# Patient Record
Sex: Male | Born: 1965
Health system: Southern US, Community
[De-identification: ages and names within clinical notes are randomized; demographics above are authoritative.]

## PROBLEM LIST (undated history)

## (undated) DIAGNOSIS — F429 Obsessive-compulsive disorder, unspecified: Secondary | ICD-10-CM

## (undated) DIAGNOSIS — G8929 Other chronic pain: Secondary | ICD-10-CM

## (undated) DIAGNOSIS — M5126 Other intervertebral disc displacement, lumbar region: Secondary | ICD-10-CM

## (undated) DIAGNOSIS — M5430 Sciatica, unspecified side: Secondary | ICD-10-CM

## (undated) DIAGNOSIS — F419 Anxiety disorder, unspecified: Secondary | ICD-10-CM

## (undated) DIAGNOSIS — I10 Essential (primary) hypertension: Secondary | ICD-10-CM

## (undated) DIAGNOSIS — K219 Gastro-esophageal reflux disease without esophagitis: Secondary | ICD-10-CM

## (undated) DIAGNOSIS — M549 Dorsalgia, unspecified: Secondary | ICD-10-CM

## (undated) HISTORY — PX: TMJ ARTHROPLASTY: SHX1066

## (undated) HISTORY — PX: APPENDECTOMY: SHX54

## (undated) HISTORY — PX: LUMBAR EPIDURAL INJECTION: SHX1980

## (undated) HISTORY — DX: Gastro-esophageal reflux disease without esophagitis: K21.9

## (undated) HISTORY — PX: TRIGGER POINT INJECTION: SHX2580

---

## 1999-08-30 ENCOUNTER — Ambulatory Visit (HOSPITAL_COMMUNITY): Admission: RE | Admit: 1999-08-30 | Discharge: 1999-08-30 | Payer: Self-pay | Admitting: Gastroenterology

## 2012-02-06 ENCOUNTER — Encounter (HOSPITAL_COMMUNITY): Payer: Self-pay | Admitting: Emergency Medicine

## 2012-02-06 ENCOUNTER — Emergency Department (HOSPITAL_COMMUNITY)
Admission: EM | Admit: 2012-02-06 | Discharge: 2012-02-07 | Disposition: A | Discharge status: Home / Self Care | Payer: Self-pay | Attending: Emergency Medicine | Admitting: Emergency Medicine

## 2012-02-06 DIAGNOSIS — M545 Low back pain, unspecified: Secondary | ICD-10-CM | POA: Insufficient documentation

## 2012-02-06 DIAGNOSIS — F17200 Nicotine dependence, unspecified, uncomplicated: Secondary | ICD-10-CM | POA: Insufficient documentation

## 2012-02-06 DIAGNOSIS — G8929 Other chronic pain: Secondary | ICD-10-CM | POA: Insufficient documentation

## 2012-02-06 DIAGNOSIS — I10 Essential (primary) hypertension: Secondary | ICD-10-CM | POA: Insufficient documentation

## 2012-02-06 HISTORY — DX: Other chronic pain: G89.29

## 2012-02-06 HISTORY — DX: Essential (primary) hypertension: I10

## 2012-02-06 HISTORY — DX: Dorsalgia, unspecified: M54.9

## 2012-02-06 MED ORDER — OXYCODONE-ACETAMINOPHEN 5-325 MG PO TABS: ORAL_TABLET | ORAL | Status: DC

## 2012-02-06 MED ORDER — ACETAMINOPHEN 325 MG PO TABS
650.0000 mg | ORAL_TABLET | Freq: Once | ORAL | Status: DC
  Filled 2012-02-06 (×2): qty 1

## 2012-02-06 NOTE — ED Notes (Signed)
Patient says he did not do anything to hurt his back it just began hurting at lunch and it progressively got worse.  Patient says his pain got severe and he decided to come to the ED.

## 2012-02-06 NOTE — ED Notes (Addendum)
PT. REPORTS CHRONIC LOW BACK PAIN WORSE TODAY UNRELIEVED BY PRESCRIPTION PERCOCET , DENIES INJURY OR FALL ,AMBULATORY. STATES HE TOOK HIS LAST PERCOCET THIS AFTERNOON .

## 2012-02-06 NOTE — ED Provider Notes (Signed)
History     CSN: [id number]  Arrival date & time 02/06/12  2300   First MD Initiated Contact with Patient 02/06/12 2315      Chief Complaint  Patient presents with  . Back Pain    (Consider location/radiation/quality/duration/timing/severity/associated sxs/prior treatment) HPI  Roberto Blair is a 46 y.o. male complaining of exacerbation of "chronic pain syndrome" Pt recently moved to town and has not been able to set up pain management. Pt states the pain is largely in his low back, rated at 10/10. Denies  fever, change in bowel or bladder habits, h/o IVDU or Cancer. Pt states he normally take MScontin and percocet but has run out.    Past Medical History  Diagnosis Date  . Chronic back pain   . Hypertension     Past Surgical History  Procedure Date  . Appendectomy   . Tmj arthroplasty     No family history on file.  History  Substance Use Topics  . Smoking status: Current Every Day Smoker  . Smokeless tobacco: Not on file  . Alcohol Use: No      Review of Systems  Constitutional: Negative for fever.  Respiratory: Negative for shortness of breath.   Cardiovascular: Negative for chest pain.  Gastrointestinal: Negative for nausea, vomiting, abdominal pain and diarrhea.  Musculoskeletal: Positive for back pain.  All other systems reviewed and are negative.    Allergies  Nsaids  Home Medications  No current outpatient prescriptions on file.  BP 111/79  Pulse 88  Temp 98 F (36.7 C) (Oral)  Resp 14  SpO2 98%  Physical Exam  Nursing note and vitals reviewed. Constitutional: He is oriented to person, place, and time. He appears well-developed and well-nourished. No distress.  HENT:  Head: Normocephalic.  Eyes: Conjunctivae normal and EOM are normal.  Cardiovascular: Normal rate.   Pulmonary/Chest: Effort normal. No stridor.  Musculoskeletal: Normal range of motion.       Back:       Bilateral lumbar paraspinal musculature TTP. Reduced ROM.     Neurological: He is alert and oriented to person, place, and time.       Strength 5/5 x4. Ambulates with antalgic gait. Distal sensation intact  Psychiatric: He has a normal mood and affect.    ED Course  Procedures (including critical care time)  Labs Reviewed - No data to display No results found.   1. Chronic pain       MDM  Pain Rx options limited by the fact that the pt is driving himself home.    Pt verbalized understanding and agrees with care plan. Outpatient follow-up and return precautions given.    New Prescriptions   OXYCODONE-ACETAMINOPHEN (PERCOCET/ROXICET) 5-325 MG PER TABLET    1 to 2 tabs PO q6hrs  PRN for pain          Wynetta Emery, PA-C 02/06/12 2351  Wynetta Emery, PA-C 03/27/12 [phone]

## 2012-02-07 NOTE — ED Notes (Signed)
Patient is alert and orientedx4.  Patient was explained discharge instructions and he understood them. 

## 2012-02-23 ENCOUNTER — Emergency Department (INDEPENDENT_AMBULATORY_CARE_PROVIDER_SITE_OTHER)
Admission: EM | Admit: 2012-02-23 | Discharge: 2012-02-23 | Disposition: A | Discharge status: Home / Self Care | Payer: Self-pay | Source: Home / Self Care | Attending: Emergency Medicine | Admitting: Emergency Medicine

## 2012-02-23 ENCOUNTER — Encounter (HOSPITAL_COMMUNITY): Payer: Self-pay | Admitting: Emergency Medicine

## 2012-02-23 DIAGNOSIS — F411 Generalized anxiety disorder: Secondary | ICD-10-CM

## 2012-02-23 DIAGNOSIS — Z760 Encounter for issue of repeat prescription: Secondary | ICD-10-CM

## 2012-02-23 HISTORY — DX: Sciatica, unspecified side: M54.30

## 2012-02-23 HISTORY — DX: Anxiety disorder, unspecified: F41.9

## 2012-02-23 HISTORY — DX: Other intervertebral disc displacement, lumbar region: M51.26

## 2012-02-23 HISTORY — DX: Obsessive-compulsive disorder, unspecified: F42.9

## 2012-02-23 NOTE — ED Provider Notes (Signed)
History     CSN: [id number]  Arrival date & time 02/23/12  1029   First MD Initiated Contact with Patient 02/23/12 1056      Chief Complaint  Patient presents with  . Medication Refill    pt is from [place] has been here the past month taking care of wifes mother and needs refill on meds    (Consider location/radiation/quality/duration/timing/severity/associated sxs/prior treatment) HPI Comments: Patient presents urgent care today requesting refills of his medicines including clonazepam and oxycodone. As far as MS Contin. Patient describes ongoing lower back pain for years and he is attended by a primary care Dr. that has been providing him with pain medicines. He recently [place] to [place] but have not discontinued his relationship with his primary care Dr. to [facility]. Patient denies any fevers, unintentional weight loss, loss of muscle control of his lower extremities. Have gone to the emergency department within the course of the month and obtained some narcotics. At that time.  The history is provided by the patient.    Past Medical History  Diagnosis Date  . Chronic back pain   . Hypertension   . Anxiety   . Sciatica   . Lumbar disc herniation   . OCD (obsessive compulsive disorder)     Past Surgical History  Procedure Date  . Appendectomy   . Tmj arthroplasty     History reviewed. No pertinent family history.  History  Substance Use Topics  . Smoking status: Current Every Day Smoker  . Smokeless tobacco: Not on file  . Alcohol Use: No      Review of Systems  Constitutional: Negative for fever, activity change, appetite change and unexpected weight change.  Musculoskeletal: Positive for back pain. Negative for gait problem.  Psychiatric/Behavioral: Negative for suicidal ideas, self-injury and agitation.    Allergies  Nsaids  Home Medications   Current Outpatient Rx  Name  Route  Sig  Dispense  Refill  . CARISOPRODOL 350 MG PO  TABS   Oral   Take 350 mg by mouth 4 (four) times daily as needed.         Marland Kitchen CLONAZEPAM 1 MG PO TABS   Oral   Take 1 mg by mouth 2 (two) times daily as needed.         Marland Kitchen GABAPENTIN 300 MG PO CAPS   Oral   Take 300 mg by mouth 3 (three) times daily.         . OXYCODONE-ACETAMINOPHEN 5-325 MG PO TABS      1 to 2 tabs PO q6hrs  PRN for pain   10 tablet   0   . QUETIAPINE FUMARATE 100 MG PO TABS   Oral   Take 100 mg by mouth at bedtime.         Marland Kitchen LOTREL PO   Oral   Take by mouth.           BP 147/90  Pulse 82  Temp 97 F (36.1 C) (Oral)  Resp 18  SpO2 98%  Physical Exam  Nursing note and vitals reviewed. Constitutional: He is oriented to person, place, and time. He appears well-developed and well-nourished. No distress.  Musculoskeletal:       Arms: Neurological: He is alert and oriented to person, place, and time.    ED Course  Procedures (including critical care time)  Labs Reviewed - No data to display No results found.   1. Medication refill  MDM  Visit for medication refills patient requesting refills on his narcotic medicines. Patient admits to be an active patient of the [facility] practice in [place]. He's under a pain management contract with a local provider there. Was seen in the emergency department during December provided with narcotics. On this instance we have discussed with patient the need for him to communicate with his doctor to obtain medicine refills. He agrees and will proceed to do so. He described that he was having transportation issues to get to see his doctor.        Jimmie Molly, MD 02/23/12 [phone]

## 2012-02-23 NOTE — ED Notes (Signed)
Pt has been out of meds for the past 10 days. Mw,cma

## 2012-02-23 NOTE — ED Notes (Signed)
Pt in need of medication refill. Pt is from [place] has been here the past month taking care of wife's mother.

## 2012-03-27 NOTE — ED Provider Notes (Signed)
Medical screening examination/treatment/procedure(s) were performed by non-physician practitioner and as supervising physician I was immediately available for consultation/collaboration.  Toy Baker, MD 03/27/12 [phone]

## 2019-03-09 ENCOUNTER — Ambulatory Visit (HOSPITAL_COMMUNITY)
Admission: EM | Admit: 2019-03-09 | Discharge: 2019-03-09 | Disposition: A | Discharge status: Home / Self Care | Payer: 59 | Attending: Emergency Medicine | Admitting: Emergency Medicine

## 2019-03-09 ENCOUNTER — Ambulatory Visit (INDEPENDENT_AMBULATORY_CARE_PROVIDER_SITE_OTHER): Payer: 59

## 2019-03-09 ENCOUNTER — Encounter (HOSPITAL_COMMUNITY): Payer: Self-pay

## 2019-03-09 ENCOUNTER — Other Ambulatory Visit: Payer: Self-pay

## 2019-03-09 DIAGNOSIS — W000XXA Fall on same level due to ice and snow, initial encounter: Secondary | ICD-10-CM

## 2019-03-09 DIAGNOSIS — S5002XA Contusion of left elbow, initial encounter: Secondary | ICD-10-CM | POA: Diagnosis not present

## 2019-03-09 DIAGNOSIS — Z760 Encounter for issue of repeat prescription: Secondary | ICD-10-CM | POA: Diagnosis not present

## 2019-03-09 DIAGNOSIS — M25522 Pain in left elbow: Secondary | ICD-10-CM

## 2019-03-09 DIAGNOSIS — G8929 Other chronic pain: Secondary | ICD-10-CM | POA: Diagnosis not present

## 2019-03-09 DIAGNOSIS — G8921 Chronic pain due to trauma: Secondary | ICD-10-CM

## 2019-03-09 MED ORDER — METHYLPREDNISOLONE 4 MG PO TBPK: ORAL_TABLET | Freq: Every day | ORAL | 0 refills | Status: DC

## 2019-03-09 MED ORDER — CARISOPRODOL 350 MG PO TABS: 350.0000 mg | ORAL_TABLET | Freq: Three times a day (TID) | ORAL | 0 refills | Status: AC

## 2019-03-09 MED ORDER — MORPHINE SULFATE ER 30 MG PO TBCR: 30.0000 mg | EXTENDED_RELEASE_TABLET | Freq: Three times a day (TID) | ORAL | 0 refills | Status: DC

## 2019-03-09 NOTE — ED Triage Notes (Signed)
Pt presents with left elbow injury after falling out on ice 2 days ago;  Pt has limited grip and limited ROM

## 2019-03-09 NOTE — ED Provider Notes (Signed)
HPI  SUBJECTIVE:  Roberto Blair is a right-handed 54 y.o. male who presents with left lateral elbow pain after having a slip and fall 2 days ago on some ice.  States he landed on his lateral elbow.  Reports swelling and a tender "knot".  He states that the pain radiates above and below his elbow.  He is describes it as sharp, constant.  No erythema, bruising.  He reports grip weakness secondary to pain, numbness and tingling in his hand and limitation of motion of his elbow secondary to pain.  Ice injury to his shoulder and wrist.  He has tried ice and a TENS unit with improvement in his symptoms.  Symptoms are worse with flexion extension at the extremes, supination.  He is a smoker.  Has history of chronic back pain, hypertension, is not on any medications for this.  No illicit drugs, alcohol use, osteoporosis, diabetes, GI bleed.  He has a history of peptic ulcer disease.  No history of cirrhosis.  He is also requesting a refill of his chronic pain medications.  States that he recently moved here, and his doctor in [place] is unable to prescribe any further controlled substances for him because he lives more than 4 hours away.  States that he has tried calling all pain management practices in [place] and has been unable to get into any one of them.  States that there is a 22-month waiting list for all of them.  States that he has been taking Soma, morphine sulfate ER for "years" last filled on 11/23, written by Riley Churches.  PMD: [facility].   Past Medical History:  Diagnosis Date  . Anxiety   . Chronic back pain   . Hypertension   . Lumbar disc herniation   . OCD (obsessive compulsive disorder)   . Sciatica     Past Surgical History:  Procedure Laterality Date  . APPENDECTOMY    . TMJ ARTHROPLASTY      Family History  Family history unknown: Yes    Social History   Tobacco Use  . Smoking status: Current Every Day Smoker  Substance Use Topics  .  Alcohol use: No  . Drug use: No    No current facility-administered medications for this encounter.  Current Outpatient Medications:  .  Amlodipine Besy-Benazepril HCl (LOTREL PO), Take by mouth., Disp: , Rfl:  .  carisoprodol (SOMA) 350 MG tablet, Take 1 tablet (350 mg total) by mouth 3 (three) times daily for 2 days., Disp: 6 tablet, Rfl: 0 .  gabapentin (NEURONTIN) 300 MG capsule, Take 300 mg by mouth 3 (three) times daily., Disp: , Rfl:  .  methylPREDNISolone (MEDROL DOSEPAK) 4 MG TBPK tablet, Take by mouth daily. Follow package instructions, Disp: 21 tablet, Rfl: 0 .  morphine (MS CONTIN) 30 MG 12 hr tablet, Take 1 tablet (30 mg total) by mouth 3 (three) times daily for 2 days., Disp: 6 tablet, Rfl: 0 .  QUEtiapine (SEROQUEL) 100 MG tablet, Take 100 mg by mouth at bedtime., Disp: , Rfl:   Allergies  Allergen Reactions  . Nsaids Other (See Comments)    Ulcer bleed     ROS  As noted in HPI.   Physical Exam  BP (!) 168/85 (BP Location: Right Arm)   Pulse 70   Temp 98.4 F (36.9 C) (Oral)   Resp 17   SpO2 96%   Constitutional: Well developed, well nourished, no acute distress Eyes:  EOMI, conjunctiva normal bilaterally  HENT: Normocephalic, atraumatic,mucus membranes moist Respiratory: Normal inspiratory effort Cardiovascular: Normal rate GI: nondistended skin: No rash, skin intact Musculoskeletal: Tender palpable mass lateral epicondyle.  Left Elbow ROM intact, able to fully extend and flex to 90 degrees, pain aggravated with extremes of flexion extension, Supracondylar region NT , Radial head NT, Olecrenon process NT , Medial epicondyle NT  Lateral epicondyle  tender,  Wrist NT, Hand NT with distal NVI CR<2secs, radial pulse intact, Sensation LT and Motor intact distally in distribution of radial, median, and ulnar nerve function.  Left grip 4/5 compared with right hand.     Neurologic: Alert & oriented x 3, no focal neuro deficits Psychiatric: Speech and behavior  appropriate   ED Course   Medications - No data to display  Orders Placed This Encounter  Procedures  . DG ELBOW COMPLETE LEFT (3+VIEW)    Standing Status:   Standing    Number of Occurrences:   1    Order Specific Question:   Reason for Exam (SYMPTOM  OR DIAGNOSIS REQUIRED)    Answer:   Fall    Order Specific Question:   Release to patient    Answer:   Immediate    No results found for this or any previous visit (from the past 24 hour(s)). DG ELBOW COMPLETE LEFT (3+VIEW)  Result Date: 03/09/2019 CLINICAL DATA:  Pain after fall. EXAM: LEFT ELBOW - COMPLETE 3+ VIEW COMPARISON:  None. FINDINGS: There is no evidence of fracture, dislocation, or joint effusion. There is no evidence of arthropathy or other focal bone abnormality. Soft tissues are unremarkable. IMPRESSION: Negative. Electronically Signed   By: Gerome Sam III M.D   On: 03/09/2019 17:11    ED Clinical Impression  1. Contusion of left elbow, initial encounter   2. Chronic pain due to trauma   3. Medication refill      ED Assessment/Plan  Imaging independently reviewed.  Normal elbow.  See radiology report for details.  Patient with an elbow contusion.  Continue ice, TENS unit, start 1000 mg of Tylenol 3-4 times a day.  Will send home with a Medrol Dosepak.  Patient states that he is unable to take NSAIDs due to peptic ulcer disease..  [place] [facility] database reviewed for this patient, patient has gotten the same prescriptions from the same several providers for the past 2 years.  Last prescription was dispensed 01/20/2019.  Discussed with patient that I was unable to provide refill of his long-term medications, but I can give him several days worth.  Will provide him primary care list for close follow-up and further management of his pain medications.  Advised him that he would not be able to get further refills at this facility.  Discussed imaging, MDM, treatment plan, and plan for follow-up with patient.  patient  agrees with plan.   Meds ordered this encounter  Medications  . carisoprodol (SOMA) 350 MG tablet    Sig: Take 1 tablet (350 mg total) by mouth 3 (three) times daily for 2 days.    Dispense:  6 tablet    Refill:  0  . morphine (MS CONTIN) 30 MG 12 hr tablet    Sig: Take 1 tablet (30 mg total) by mouth 3 (three) times daily for 2 days.    Dispense:  6 tablet    Refill:  0  . methylPREDNISolone (MEDROL DOSEPAK) 4 MG TBPK tablet    Sig: Take by mouth daily. Follow package instructions    Dispense:  21 tablet  Refill:  0    *This clinic note was created using [employer]. Therefore, there may be occasional mistakes despite careful proofreading.   ?    Melynda Ripple, MD 03/09/19 [phone]

## 2019-03-09 NOTE — Discharge Instructions (Signed)
Continue ice, TENS unit.  1 g of Tylenol 3 4 times a day as needed.  The Medrol Dosepak may also help.  Unfortunately, I am not able to give you any more than a few days worth of your chronic medications.  You will not be able to have these refilled at this facility again.  Please call the [facility] internal medicine clinic to be seen as soon as you can.  Below is a list of primary care practices who are taking new patients for you to follow-up with.  [facility] internal medicine clinic Ground Floor - [facility], [address] [place], [place], [place] [address] [phone]  [facility] at [facility] [address] [place], [place] [address] [phone]  [facility] [address] [place], [place] [address] [phone]  [facility] [phone] [address] [place] [address]  [facility]: [address] [place] [address]  [phone]  [facility]: [address] [place] [address]   [phone]  [facility]: [address] [place] [address]  [phone]  [facility] : [address] [place] [address] [phone]  [facility]: [address] [place] [address] [phone]  [facility]: [address] [place] [address] [phone]  Dr. Oneal Grout [address] [facility] [place] [address]  [phone]  Dr. Jackie Plum, [facility]. [address] [place], [place] [address]  [phone]  Go to www.goodrx.com to look up your medications. This will give you a list of where you can find your prescriptions at the most affordable prices. Or ask the pharmacist what the cash price is,  or if they have any other discount programs available to help make your medication more affordable. This can be less expensive than what you would pay with insurance.

## 2019-03-10 ENCOUNTER — Telehealth (HOSPITAL_COMMUNITY): Payer: Self-pay | Admitting: Emergency Medicine

## 2019-03-10 NOTE — Telephone Encounter (Signed)
Pts wife calling with questions about his f/u care.  She was not on [employer] so I called the pt personally.    Pt is concerned because he was only written 2 days of pain medication and he can't get a new patient appointment for at least one month.  Pt is concerned about where he will be able to get more pain medication.  I explained to the pt that we are only allowed to write pain prescriptions for a certain amount of time per federal law.  Pt stated understanding but didn't know what to do.  It was suggested he make a new pt appointment wherever he most liked and to call us for a telehealth visit and discuss his issues with a provider.  I explained they would better be able to tell him exactly how to handle this situation.    Pt stated understanding and stated he would make a new pt appointment as soon as possible.

## 2019-03-11 ENCOUNTER — Telehealth: Payer: Self-pay | Admitting: Medical

## 2019-03-11 ENCOUNTER — Other Ambulatory Visit: Payer: Self-pay

## 2019-03-11 ENCOUNTER — Encounter: Payer: Self-pay | Admitting: Medical

## 2019-03-11 ENCOUNTER — Ambulatory Visit: Payer: 59 | Admitting: Medical

## 2019-03-11 VITALS — BP 146/74 | HR 92 | Temp 98.4°F | Ht 72.0 in | Wt 216.6 lb

## 2019-03-11 DIAGNOSIS — R41840 Attention and concentration deficit: Secondary | ICD-10-CM | POA: Insufficient documentation

## 2019-03-11 DIAGNOSIS — M25562 Pain in left knee: Secondary | ICD-10-CM

## 2019-03-11 DIAGNOSIS — T39391A Poisoning by other nonsteroidal anti-inflammatory drugs [NSAID], accidental (unintentional), initial encounter: Secondary | ICD-10-CM | POA: Insufficient documentation

## 2019-03-11 DIAGNOSIS — M5440 Lumbago with sciatica, unspecified side: Secondary | ICD-10-CM | POA: Diagnosis not present

## 2019-03-11 DIAGNOSIS — S5002XA Contusion of left elbow, initial encounter: Secondary | ICD-10-CM | POA: Diagnosis not present

## 2019-03-11 DIAGNOSIS — G8929 Other chronic pain: Secondary | ICD-10-CM | POA: Insufficient documentation

## 2019-03-11 DIAGNOSIS — Z720 Tobacco use: Secondary | ICD-10-CM

## 2019-03-11 DIAGNOSIS — M25561 Pain in right knee: Secondary | ICD-10-CM | POA: Diagnosis not present

## 2019-03-11 DIAGNOSIS — F5104 Psychophysiologic insomnia: Secondary | ICD-10-CM | POA: Insufficient documentation

## 2019-03-11 MED ORDER — MORPHINE SULFATE ER 30 MG PO TBCR: 30.0000 mg | EXTENDED_RELEASE_TABLET | Freq: Three times a day (TID) | ORAL | 0 refills | Status: DC

## 2019-03-11 NOTE — Telephone Encounter (Signed)
Pts wife called and wanted to see if you were sending all pts meds to the pharmacy because he went to pick them up but only 1 was sent over
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
## 2019-03-11 NOTE — Telephone Encounter (Signed)
Refer to pain management ASAP

## 2019-03-12 ENCOUNTER — Other Ambulatory Visit: Payer: Self-pay | Admitting: Medical

## 2019-03-12 NOTE — Telephone Encounter (Signed)
(  Beverlee Nims - please help.  He was new to [place] yesterday.  He just moved up from [place] 3 months ago.  Apparently his wife worked for Dr. [employer] several years ago.  His wife has already called twice today requesting his other medicines be refilled which are mostly controlled substances)   To patient or wife:  I reviewed the request for medication review.  I just met him yesterday.  I am reviewing his chart.  I have discussed this chart and case with my supervising physician Dr. Redmond School.  Our clinic ,like many clinics, review patient records before deciding to take over care on a new patient.  So the first visit is like an interview for the patient and the doctor's office.    We generally DO NOT treat chronic pain, and he is on several controlled substances, several sedating medications including high potency pain medicine morphine.    I am not going to refill ANY medications until I get records from his prior primary care office.  This could take 2 days or 3 weeks.  I have no control over how long this process takes.  They can certainly call [facility] and asked them to expedite record release.  He may want to talk to [facility] about additional refills since they were managing his medications prior.  It is unfortunate that he is out of medication.  That is out of my control.    We will continue plan to refer to a local pain clinic.    Generally when someone establishes care home numerous medications including strong sleep aids, we try to make an attempt over time to find ways to reduce some of the pill burden and find better ways to manage insomnia.  He is on attention deficit medication.  I will need to make sure that the records we receive has evidence of prior evaluation for official diagnosis of ADD.   I want to also look for prior evaluation for insomnia and sleep issues.  Bottom line, if he needs refills within the next 3-4 weeks he needs to call his prior PCP and request them   to help until he gets established with a pain management clinic.  If he is looking for one clinic to manage all these issues and he may want to find another clinic that may have more of an expertise with this.  Again, I am not guaranteeing we will manage his sleep issues, attention deficit issues etc. and medications until after reviewing his records.  Audelia Acton

## 2019-03-12 NOTE — Telephone Encounter (Signed)
Please review message. And wife wants him to be referred to [facility].

## 2019-03-12 NOTE — Telephone Encounter (Signed)
Patient wife called stating he needs all medications that was sent in.

## 2019-03-13 NOTE — Telephone Encounter (Signed)
I spoke with pt, explained we have not received the medical records and nothing more can be done until received and reviewed.  And then no promises.  I gave him all of your information.  He said he understands.  I advised he will need to get future refills from his old pcp.  He states he hasn't seen them since November and since he has moved they will not cover him.  I inquired why took so long to make an appt with PCP and he said everyone was booked out and then his wife remember [place].  Again I reminded him we do not cover for controlled substances and you are not sure you will be able to cover him for all the other meds.  He states please refer him to [facility].  Also he states as of tomorrow he will not have any meds and could you give him 3 pills for the weekend.  And he would be willing to do a virtual visit with you every 3 days if needed.  I explained to him the virtual visits will not tell us about his history.  Please advise pt about the 3 pills.

## 2019-03-14 ENCOUNTER — Other Ambulatory Visit: Payer: Self-pay | Admitting: Medical

## 2019-03-14 ENCOUNTER — Other Ambulatory Visit: Payer: Self-pay

## 2019-03-14 DIAGNOSIS — G8929 Other chronic pain: Secondary | ICD-10-CM

## 2019-03-14 DIAGNOSIS — M25562 Pain in left knee: Secondary | ICD-10-CM

## 2019-03-14 DIAGNOSIS — M5440 Lumbago with sciatica, unspecified side: Secondary | ICD-10-CM

## 2019-03-14 DIAGNOSIS — S5002XA Contusion of left elbow, initial encounter: Secondary | ICD-10-CM

## 2019-03-14 MED ORDER — MORPHINE SULFATE ER 30 MG PO TBCR: 30.0000 mg | EXTENDED_RELEASE_TABLET | Freq: Three times a day (TID) | ORAL | 0 refills | Status: AC

## 2019-03-18 ENCOUNTER — Telehealth: Payer: Self-pay | Admitting: Medical

## 2019-03-18 NOTE — Telephone Encounter (Signed)
Pt wife called and state that pt needs refill on on pts medicines states that she talked to [facility] and they are waiting to see if the dr will see him and said they will take about a week and they if do they are booked out till the end of the month, and if the medicine is called in if the medical asstiant will call her back and she states no one calls her back

## 2019-03-19 ENCOUNTER — Telehealth: Payer: Self-pay | Admitting: Family Medicine

## 2019-03-19 ENCOUNTER — Encounter: Payer: Self-pay | Admitting: Family Medicine

## 2019-03-19 NOTE — Telephone Encounter (Signed)
Wife has been calling 2 times a day advising how many medications we need to go ahead and refill for her husband.   A conversation has been had on several occassions with patient, that until we receive the medical records we can do nothing further.  Also, no guarantee that once we receive records that we would be able to do anything.  We do not fill Controlled Substance.  I discussed with provider Kristian Covey and he agrees we will not be able to accept [facility] Roberto Blair's care.  I answered wife's call and explained to her, I understand the frustration as we still do not have the records.  We will not be able to care for her husband.  She understood.

## 2019-03-25 ENCOUNTER — Other Ambulatory Visit: Payer: Self-pay

## 2019-03-25 ENCOUNTER — Ambulatory Visit: Payer: 59 | Admitting: Internal Medicine

## 2019-03-25 ENCOUNTER — Encounter: Payer: Self-pay | Admitting: Internal Medicine

## 2019-03-25 VITALS — BP 143/74 | HR 73 | Temp 98.2°F | Ht 72.0 in | Wt 217.7 lb

## 2019-03-25 DIAGNOSIS — M5441 Lumbago with sciatica, right side: Secondary | ICD-10-CM | POA: Diagnosis not present

## 2019-03-25 DIAGNOSIS — W19XXXD Unspecified fall, subsequent encounter: Secondary | ICD-10-CM

## 2019-03-25 DIAGNOSIS — M5442 Lumbago with sciatica, left side: Secondary | ICD-10-CM | POA: Diagnosis not present

## 2019-03-25 DIAGNOSIS — M25562 Pain in left knee: Secondary | ICD-10-CM

## 2019-03-25 DIAGNOSIS — S5002XA Contusion of left elbow, initial encounter: Secondary | ICD-10-CM

## 2019-03-25 DIAGNOSIS — M25561 Pain in right knee: Secondary | ICD-10-CM | POA: Diagnosis not present

## 2019-03-25 DIAGNOSIS — G8921 Chronic pain due to trauma: Secondary | ICD-10-CM

## 2019-03-25 DIAGNOSIS — S5002XD Contusion of left elbow, subsequent encounter: Secondary | ICD-10-CM

## 2019-03-25 DIAGNOSIS — Z79899 Other long term (current) drug therapy: Secondary | ICD-10-CM

## 2019-03-25 DIAGNOSIS — F5104 Psychophysiologic insomnia: Secondary | ICD-10-CM

## 2019-03-25 DIAGNOSIS — G8929 Other chronic pain: Secondary | ICD-10-CM

## 2019-03-25 DIAGNOSIS — Z79891 Long term (current) use of opiate analgesic: Secondary | ICD-10-CM

## 2019-03-25 DIAGNOSIS — R41840 Attention and concentration deficit: Secondary | ICD-10-CM

## 2019-03-25 MED ORDER — QUETIAPINE FUMARATE 100 MG PO TABS: 100.0000 mg | ORAL_TABLET | Freq: Every day | ORAL | 0 refills | Status: DC

## 2019-03-25 MED ORDER — AMPHETAMINE-DEXTROAMPHETAMINE 20 MG PO TABS: 20.0000 mg | ORAL_TABLET | Freq: Two times a day (BID) | ORAL | 0 refills | Status: DC

## 2019-03-25 MED ORDER — MORPHINE SULFATE ER 30 MG PO T12A: 30.0000 mg | EXTENDED_RELEASE_TABLET | Freq: Three times a day (TID) | ORAL | 0 refills | Status: DC | PRN

## 2019-03-25 MED ORDER — CLONIDINE HCL 0.1 MG PO TABS: 0.1000 mg | ORAL_TABLET | Freq: Every day | ORAL | 0 refills | Status: DC

## 2019-03-25 NOTE — Assessment & Plan Note (Signed)
He fell a few weeks ago on his elbow. X-ray was done but this is still hurting significantly. The pain is on the medial and lateral aspects of the elbow and is worse when he flexes his arm and is trying to lift something. There is minor swelling around the elbow joint. Full active and passive ROM.   - will refer for physical therapy, may need consider MRI if no improvement

## 2019-03-25 NOTE — Assessment & Plan Note (Signed)
He takes amphetamine-dextroamphetamine for difficulty with concentration. He states he has been on this for many years as well. He is not sure if he was every officially tested for ADD but was not on these medications as a child. 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  - requesting records from PCP  - refill adderall 20 mg bid

## 2019-03-25 NOTE — Patient Instructions (Addendum)
Thank you for allowing [place] to provide your care today. Today we discussed your chronic lower extremity and back pain.   I have refilled your medications and placed a referral to the Pain Management clinic. They will call you to make an appointment.   I have also placed a referral for physical therapy, they should call you for an appointment.   Please follow-up in two weeks with PCP.     Please call the internal medicine center clinic if you have any questions or concerns, we may be able to help and keep you from a long and expensive emergency room wait. Our clinic and after hours phone number is [phone], the best time to call is Monday through Friday 9 am to 4 pm but there is always someone available 24/7 if you have an emergency. If you need medication refills please notify your pharmacy one week in advance and they will send [place] a request.
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
## 2019-03-25 NOTE — Assessment & Plan Note (Addendum)
He is on a very high level of centrally acting medications for his pain in addition to seroquel and clonidine for difficulty sleeping and adderall for concentration. Long conversation was had discussing my concerns with the many medications he is currently on. Discussed that while we can do some short term prescription while he is establishing with a pain management clinic, our clinic does not typically manage such high level chronic opiates, and I have concerns with the inherent risks of this many centrally acting medications. He endorsed understanding.   - two month controlled medication contract for morphine and adderall  - referral placed to pain management - UDS at follow-up in two weeks  - requesting records and imaging from PCP in [place] - hold closiprodol 350 mg bid - cont. Morphine 30 mg tid prn  - PDMP reviewed  - referral to physical therapy

## 2019-03-25 NOTE — Assessment & Plan Note (Signed)
He takes seroquel and clonidine for insomnia and difficulty sleeping and has also been on these medications for many years. He states he has been unable to sleep without them.   - refill seroquel and clonidine - discuss sleep hygiene further at follow-up - records pending from previous PCP

## 2019-03-26 NOTE — Progress Notes (Signed)
Internal Medicine Clinic Attending  Case discussed with Dr. Seawell at the time of the visit.  We reviewed the resident's history and exam and pertinent patient test results.  I agree with the assessment, diagnosis, and plan of care documented in the resident's note.    

## 2019-03-27 ENCOUNTER — Other Ambulatory Visit: Payer: Self-pay | Admitting: Internal Medicine

## 2019-03-27 DIAGNOSIS — G8929 Other chronic pain: Secondary | ICD-10-CM

## 2019-03-27 DIAGNOSIS — M5440 Lumbago with sciatica, unspecified side: Secondary | ICD-10-CM

## 2019-03-27 NOTE — Progress Notes (Signed)
pian

## 2019-03-28 ENCOUNTER — Telehealth: Payer: Self-pay | Admitting: Medical

## 2019-03-28 NOTE — Telephone Encounter (Signed)
Of note, I received notice today that [facility] will not take him on as a patient.

## 2019-03-28 NOTE — Telephone Encounter (Signed)
Thanks, yea we got notice. I sent out a new referral. Tough case, hopefully he is able to get in somewhere.

## 2019-04-02 ENCOUNTER — Encounter: Payer: Self-pay | Admitting: *Deleted

## 2019-04-08 ENCOUNTER — Other Ambulatory Visit: Payer: Self-pay

## 2019-04-08 ENCOUNTER — Ambulatory Visit: Payer: 59 | Admitting: Internal Medicine

## 2019-04-08 ENCOUNTER — Encounter: Payer: Self-pay | Admitting: Internal Medicine

## 2019-04-08 VITALS — BP 114/65 | HR 82 | Ht 73.0 in | Wt 220.6 lb

## 2019-04-08 DIAGNOSIS — G8929 Other chronic pain: Secondary | ICD-10-CM | POA: Diagnosis not present

## 2019-04-08 DIAGNOSIS — M5440 Lumbago with sciatica, unspecified side: Secondary | ICD-10-CM

## 2019-04-08 DIAGNOSIS — M5126 Other intervertebral disc displacement, lumbar region: Secondary | ICD-10-CM | POA: Diagnosis not present

## 2019-04-08 DIAGNOSIS — F5104 Psychophysiologic insomnia: Secondary | ICD-10-CM

## 2019-04-08 DIAGNOSIS — M48061 Spinal stenosis, lumbar region without neurogenic claudication: Secondary | ICD-10-CM | POA: Diagnosis not present

## 2019-04-08 DIAGNOSIS — Z79891 Long term (current) use of opiate analgesic: Secondary | ICD-10-CM | POA: Insufficient documentation

## 2019-04-08 DIAGNOSIS — Z79899 Other long term (current) drug therapy: Secondary | ICD-10-CM

## 2019-04-08 MED ORDER — QUETIAPINE FUMARATE 100 MG PO TABS: 100.0000 mg | ORAL_TABLET | Freq: Every day | ORAL | 0 refills | Status: DC

## 2019-04-08 MED ORDER — CLONIDINE HCL 0.1 MG PO TABS: 0.1000 mg | ORAL_TABLET | Freq: Every day | ORAL | 0 refills | Status: DC

## 2019-04-08 NOTE — Patient Instructions (Addendum)
Mr. Howat,   I refilled your Seroquel and clonidine and sent it to [facility].   Call us if you have any questions or concerns.   - Dr. Evelene Croon

## 2019-04-10 ENCOUNTER — Other Ambulatory Visit: Payer: Self-pay | Admitting: Internal Medicine

## 2019-04-10 ENCOUNTER — Telehealth: Payer: Self-pay

## 2019-04-10 ENCOUNTER — Encounter: Payer: Self-pay | Admitting: Internal Medicine

## 2019-04-10 DIAGNOSIS — F5104 Psychophysiologic insomnia: Secondary | ICD-10-CM

## 2019-04-10 MED ORDER — CLONIDINE HCL 0.1 MG PO TABS: 0.2000 mg | ORAL_TABLET | Freq: Every day | ORAL | 0 refills | Status: DC

## 2019-04-10 MED ORDER — QUETIAPINE FUMARATE 100 MG PO TABS: 200.0000 mg | ORAL_TABLET | Freq: Every day | ORAL | 0 refills | Status: DC

## 2019-04-10 NOTE — Telephone Encounter (Signed)
Done

## 2019-04-10 NOTE — Progress Notes (Signed)
Internal Medicine Clinic Attending  Case discussed with Dr. Santos-Sanchez at the time of the visit.  We reviewed the resident's history and exam and pertinent patient test results.  I agree with the assessment, diagnosis, and plan of care documented in the resident's note.    

## 2019-04-10 NOTE — Progress Notes (Signed)
   CC: Medication management   HPI:  [name] is a 54 y.o. year-old male with PMH listed below who presents to clinic for medication management. Please see problem based assessment and plan for further details.   Past Medical History:  Diagnosis Date  . Anxiety   . Chronic back pain   . GERD (gastroesophageal reflux disease)   . Hypertension    prior, was on medication for a few months in the past  . Lumbar disc herniation   . OCD (obsessive compulsive disorder)   . Sciatica    Review of Systems:   Review of Systems  Constitutional: Negative for chills and fever.  Musculoskeletal: Positive for back pain and joint pain.    Physical Exam:  Vitals:   04/08/19 1417 04/08/19 1420  BP:  114/65  Pulse:  82  SpO2:  98%  Weight: 220 lb 9.6 oz (100.1 kg)   Height: 6\' 1"  (1.854 m)     General: well-appearing male in NAD  Neuro: A&Ox3, no motor or sensory deficits noted  MSK: pain on flexion of back and bilateral knee but with full ROM    Assessment & Plan:   See Encounters Tab for problem based charting.  Patient discussed with Dr. 

## 2019-04-10 NOTE — Assessment & Plan Note (Addendum)
Patient presents for follow up of chronic lower back pain that is secondary to L4-L5 disc herniation causing moderate canal central stenosis per MRI from 2010 on care everywhere. He moved from [place] recently and established with [place] 2 weeks ago. His previous physician had him on Morphine ER 30 mg TID, Soma, and gabapentin. This is in addition to other centrally acting medications for insomnia and concentration. During his initial visit with [place], Dr. Cleaster Corin discussed her concerns about this and recommended discontinuing Soma and referral to pain management clinic given high level of opiate use. Since it can take 6-8 weeks to established with a pain management clinic, Dr. Cleaster Corin offered to refill morphine and gabapentin only for the next 2 months. He was agreeable to this.    He presents today complaining of worsening lower back pain since stopping Soma two weeks ago and is requesting a prescription for it until he is able to follow up with the pain clinic. He has not had any recent trauma or injuries. We again discussed the risks vs benefits of resuming Soma while on other centrally acting medications. He voiced understanding. We will continue current regimen for now. PDMP reviewed and appropriate. He will be due for a morphine ER refill in 04/23/2019. This should be the last refill we offer as he should be establish with a pain clinic by March.

## 2019-04-10 NOTE — Telephone Encounter (Signed)
RX clarification:   Received fax from pharmacy R/T Seroquel RX and Clonidine RX which were sent on 04/08/19. RX's were sent for:  Conindine 0.1mg  QD Seroquel 100mg  QHS  "Pt states he takes 2 tablets daily of each medication, please resend if appropriate".  Please see LOV note from 04/08/19. Thank you, 06/06/19, RN,BSN

## 2019-04-10 NOTE — Assessment & Plan Note (Signed)
PDMP reviewed and appropriate. UDS collected today. He will be due for a morphine ER refill in 04/23/2019. This should be the last refill we offer as he should be established with a pain clinic by March.

## 2019-04-10 NOTE — Assessment & Plan Note (Signed)
Patient reports clonidine and Seroquel were not refill correctly. He is taking clonidine 0.2 mg and Seroquel 200 mg QHS. I confirmed this with his pharmacy. Sent refills with correct dose.

## 2019-04-11 LAB — TOXASSURE SELECT,+ANTIDEPR,UR

## 2019-04-23 ENCOUNTER — Telehealth: Payer: Self-pay | Admitting: Internal Medicine

## 2019-04-23 NOTE — Telephone Encounter (Signed)
Needs refill on gabapentin (NEURONTIN) 800 MG tablet   cloNIDine (CATAPRES) 0.1 MG tablet amphetamine-dextroamphetamine (ADDERALL) 20 MG tablet Amlodipine Besy-Benazepril HCl (LOTREL PO)  ;pt contact [phone]   QUEtiapine (SEROQUEL) 100 MG tablet for 120 qty twice a day  Karin Golden is [address] [place]

## 2019-04-23 NOTE — Telephone Encounter (Signed)
Dr. Mordecai Maes, Please see your LOV note from 04/08/19 and telephone note from 04/10/19 (regarding clonidine and seroquel RX's). Pt has a pain clinic referral appt pending and Chili is checking on appt timeframe today, she states they are behind in scheduling.  Please advise on refill request/if you would like pt seen before add'l refills, etc. Thank you, SChaplin, RN,BSN

## 2019-04-24 ENCOUNTER — Other Ambulatory Visit: Payer: Self-pay | Admitting: Internal Medicine

## 2019-04-24 DIAGNOSIS — F5104 Psychophysiologic insomnia: Secondary | ICD-10-CM

## 2019-04-24 DIAGNOSIS — M5440 Lumbago with sciatica, unspecified side: Secondary | ICD-10-CM

## 2019-04-24 DIAGNOSIS — R41840 Attention and concentration deficit: Secondary | ICD-10-CM

## 2019-04-24 DIAGNOSIS — G8929 Other chronic pain: Secondary | ICD-10-CM

## 2019-04-24 MED ORDER — CLONIDINE HCL 0.1 MG PO TABS: 0.2000 mg | ORAL_TABLET | Freq: Every day | ORAL | 0 refills | Status: DC

## 2019-04-24 MED ORDER — QUETIAPINE FUMARATE 100 MG PO TABS: 200.0000 mg | ORAL_TABLET | Freq: Every day | ORAL | 0 refills | Status: DC

## 2019-04-24 MED ORDER — MORPHINE SULFATE ER 30 MG PO T12A: 30.0000 mg | EXTENDED_RELEASE_TABLET | Freq: Three times a day (TID) | ORAL | 0 refills | Status: DC | PRN

## 2019-04-24 MED ORDER — GABAPENTIN 800 MG PO TABS: 800.0000 mg | ORAL_TABLET | Freq: Four times a day (QID) | ORAL | 0 refills | Status: DC

## 2019-04-24 MED ORDER — AMPHETAMINE-DEXTROAMPHETAMINE 20 MG PO TABS: 20.0000 mg | ORAL_TABLET | Freq: Two times a day (BID) | ORAL | 0 refills | Status: DC

## 2019-04-24 NOTE — Telephone Encounter (Signed)
RTC to patient, self-identifying VM obtained and [employer] compliant message left regarding amlodipine dosage. SChaplin, RN,BSN

## 2019-04-24 NOTE — Telephone Encounter (Signed)
Refilled all medications except his blood pressure medication amlodipine-benazepril as we do not have a documented dose on the chart. Could you reach out to patient and verify? Thanks! Also, if he needs refills in the future please send request to PCP as he will be the one managing this medications moving forward. Thanks!

## 2019-04-24 NOTE — Telephone Encounter (Signed)
Pt called very anxious about getting all 5 of his meds asking specifically about morphine and adderall

## 2019-04-29 NOTE — Addendum Note (Signed)
Addended by: Neomia Dear on: 04/29/2019 06:49 PM   Modules accepted: Orders

## 2019-05-13 ENCOUNTER — Other Ambulatory Visit: Payer: Self-pay

## 2019-05-13 ENCOUNTER — Ambulatory Visit: Payer: 59 | Admitting: Internal Medicine

## 2019-05-13 ENCOUNTER — Encounter: Payer: Self-pay | Admitting: Internal Medicine

## 2019-05-13 VITALS — BP 138/67 | HR 72 | Temp 98.2°F | Ht 73.0 in | Wt 221.7 lb

## 2019-05-13 DIAGNOSIS — Z79899 Other long term (current) drug therapy: Secondary | ICD-10-CM

## 2019-05-13 DIAGNOSIS — M48061 Spinal stenosis, lumbar region without neurogenic claudication: Secondary | ICD-10-CM

## 2019-05-13 DIAGNOSIS — F5104 Psychophysiologic insomnia: Secondary | ICD-10-CM

## 2019-05-13 DIAGNOSIS — G8929 Other chronic pain: Secondary | ICD-10-CM

## 2019-05-13 DIAGNOSIS — G4700 Insomnia, unspecified: Secondary | ICD-10-CM

## 2019-05-13 DIAGNOSIS — M5440 Lumbago with sciatica, unspecified side: Secondary | ICD-10-CM

## 2019-05-13 DIAGNOSIS — M5126 Other intervertebral disc displacement, lumbar region: Secondary | ICD-10-CM | POA: Diagnosis not present

## 2019-05-13 DIAGNOSIS — R41840 Attention and concentration deficit: Secondary | ICD-10-CM

## 2019-05-13 DIAGNOSIS — Z79891 Long term (current) use of opiate analgesic: Secondary | ICD-10-CM

## 2019-05-13 NOTE — Patient Instructions (Signed)
Thank you for coming today to help [place] create a plan to treat your pain.  I am ordering a lumbar MIRI I am getting your nerve conduction results I am referring you to psychiatry and PT  I am refilling your medicines  I will schedule an appt in 5 weeks and we will go over your appts with PT, psychiatry, and  MRI results

## 2019-05-13 NOTE — Progress Notes (Deleted)
Void  

## 2019-05-14 ENCOUNTER — Other Ambulatory Visit: Payer: Self-pay | Admitting: Internal Medicine

## 2019-05-14 DIAGNOSIS — F5104 Psychophysiologic insomnia: Secondary | ICD-10-CM

## 2019-05-14 MED ORDER — MORPHINE SULFATE ER 30 MG PO T12A: 30.0000 mg | EXTENDED_RELEASE_TABLET | Freq: Three times a day (TID) | ORAL | 0 refills | Status: DC | PRN

## 2019-05-14 MED ORDER — CARISOPRODOL 350 MG PO TABS
350.0000 mg | ORAL_TABLET | Freq: Three times a day (TID) | ORAL | 0 refills | Status: DC | PRN
Start: 2019-05-14 — End: 2019-06-16

## 2019-05-14 MED ORDER — GABAPENTIN 800 MG PO TABS: 800.0000 mg | ORAL_TABLET | Freq: Four times a day (QID) | ORAL | 0 refills | Status: DC

## 2019-05-14 MED ORDER — CLONIDINE HCL 0.1 MG PO TABS: 0.2000 mg | ORAL_TABLET | Freq: Every day | ORAL | 0 refills | Status: DC

## 2019-05-14 MED ORDER — QUETIAPINE FUMARATE 100 MG PO TABS: 200.0000 mg | ORAL_TABLET | Freq: Every day | ORAL | 0 refills | Status: DC

## 2019-05-14 MED ORDER — AMPHETAMINE-DEXTROAMPHETAMINE 20 MG PO TABS: 20.0000 mg | ORAL_TABLET | Freq: Two times a day (BID) | ORAL | 0 refills | Status: DC

## 2019-05-14 NOTE — Progress Notes (Signed)
   Subjective:    Patient ID: Roberto Blair, male    DOB: February 16, 1966, 54 y.o.   MRN: [id number]  HPI  Roberto Blair is here for chronic pain F/U. Please see the A&P for the status of the pt's chronic medical problems.  ROS : per ROS section and in problem oriented charting. All other systems are negative.  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 ,    Review of Systems  Musculoskeletal: Positive for arthralgias, back pain, gait problem and myalgias.  Psychiatric/Behavioral: Positive for sleep disturbance.       Concentration issues       Objective:   Physical Exam Vitals and nursing note reviewed. Exam conducted with a chaperone present.  Constitutional:      General: He is not in acute distress.    Appearance: Normal appearance. He is not ill-appearing, toxic-appearing or diaphoretic.  HENT:     Head: Normocephalic and atraumatic.     Right Ear: External ear normal.     Left Ear: External ear normal.  Eyes:     Extraocular Movements: Extraocular movements intact.     Conjunctiva/sclera: Conjunctivae normal.  Neurological:     Mental Status: He is alert.  Psychiatric:        Mood and Affect: Mood normal.        Behavior: Behavior normal.        Thought Content: Thought content normal.        Judgment: Judgment normal.       Assessment & Plan:  I asked Dorie Rank, [facility] director, to assist me with the visit as she knows the referral process and resources better than me.  He gave consent for her to be present.  Billing notes He has a chronic issue that does pose a safety threat to life or bodily function as he is on multiple centrally acting medications.  Additionally, his difficulty concentrating could be a side effect of his centrally acting medications.  I reviewed multiple prior notes along with his MRI.  I collaborated with our mental health provider and a pharmacist.  Before his visit, I spent 30 minutes reviewing his chart.   Jasmine December and I discussed the appointment for 10 minutes prior to the appointment.  I spent 30 minutes face-to-face with the patient.  We discussed his treatment plan with our mental health provider for 5 minutes.  I spent 20 minutes documenting.  Total time in 1 hour 35 minutes
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
## 2019-05-14 NOTE — Addendum Note (Signed)
Addended by: Blanch Media A on: 05/14/2019 05:03 PM   Modules accepted: Orders

## 2019-05-28 ENCOUNTER — Encounter: Payer: Self-pay | Admitting: Licensed Clinical Social Worker

## 2019-05-28 ENCOUNTER — Telehealth: Payer: Self-pay | Admitting: Licensed Clinical Social Worker

## 2019-05-28 NOTE — Telephone Encounter (Signed)
Patient was called to discuss psychiatry referrals. Patient understands I will be mailing him a list of options, and some providers do not require a referral. Patient will call the office back and speak to me if he chooses a provider that requires a referral for psy.

## 2019-06-04 ENCOUNTER — Other Ambulatory Visit: Payer: Self-pay

## 2019-06-04 ENCOUNTER — Encounter: Payer: Self-pay | Admitting: Physical Therapy

## 2019-06-04 ENCOUNTER — Ambulatory Visit: Payer: 59 | Attending: Internal Medicine | Admitting: Physical Therapy

## 2019-06-04 DIAGNOSIS — M25561 Pain in right knee: Secondary | ICD-10-CM | POA: Insufficient documentation

## 2019-06-04 DIAGNOSIS — G8929 Other chronic pain: Secondary | ICD-10-CM | POA: Diagnosis present

## 2019-06-04 DIAGNOSIS — M25562 Pain in left knee: Secondary | ICD-10-CM | POA: Insufficient documentation

## 2019-06-04 DIAGNOSIS — M62830 Muscle spasm of back: Secondary | ICD-10-CM | POA: Diagnosis present

## 2019-06-04 DIAGNOSIS — M545 Low back pain: Secondary | ICD-10-CM | POA: Insufficient documentation

## 2019-06-04 DIAGNOSIS — M6281 Muscle weakness (generalized): Secondary | ICD-10-CM | POA: Insufficient documentation
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
## 2019-06-05 ENCOUNTER — Ambulatory Visit (HOSPITAL_COMMUNITY): Payer: 59

## 2019-06-11 ENCOUNTER — Ambulatory Visit (HOSPITAL_COMMUNITY)
Admission: RE | Admit: 2019-06-11 | Discharge: 2019-06-11 | Disposition: A | Discharge status: Home / Self Care | Payer: 59 | Source: Ambulatory Visit | Attending: Internal Medicine | Admitting: Internal Medicine

## 2019-06-11 ENCOUNTER — Other Ambulatory Visit: Payer: Self-pay

## 2019-06-11 DIAGNOSIS — Z79891 Long term (current) use of opiate analgesic: Secondary | ICD-10-CM | POA: Diagnosis present

## 2019-06-13 ENCOUNTER — Ambulatory Visit (HOSPITAL_COMMUNITY): Payer: Self-pay | Admitting: Psychiatry

## 2019-06-16 ENCOUNTER — Other Ambulatory Visit: Payer: Self-pay

## 2019-06-16 DIAGNOSIS — R41840 Attention and concentration deficit: Secondary | ICD-10-CM

## 2019-06-16 DIAGNOSIS — M5440 Lumbago with sciatica, unspecified side: Secondary | ICD-10-CM

## 2019-06-16 DIAGNOSIS — G8929 Other chronic pain: Secondary | ICD-10-CM

## 2019-06-16 DIAGNOSIS — F5104 Psychophysiologic insomnia: Secondary | ICD-10-CM

## 2019-06-16 NOTE — Telephone Encounter (Signed)
gabapentin (NEURONTIN) 800 MG tablet   Morphine Sulfate ER 30 MG T12A    carisoprodol (SOMA) 350 MG tablet    cloNIDine (CATAPRES) 0.1 MG tablet   QUEtiapine (SEROQUEL) 100 MG tablet  amphetamine-dextroamphetamine (ADDERALL) 20 MG tablet, REFILL REQUEST @ [facility] [address].

## 2019-06-17 NOTE — Telephone Encounter (Signed)
Does he need today or is tomorrow PCP appt OK?

## 2019-06-18 ENCOUNTER — Encounter: Payer: Self-pay | Admitting: Internal Medicine

## 2019-06-18 ENCOUNTER — Ambulatory Visit: Payer: 59 | Admitting: Physical Therapy

## 2019-06-18 ENCOUNTER — Ambulatory Visit: Payer: 59 | Admitting: Internal Medicine

## 2019-06-18 ENCOUNTER — Other Ambulatory Visit: Payer: Self-pay

## 2019-06-18 VITALS — BP 135/78 | HR 70 | Temp 98.0°F | Ht 73.0 in | Wt 214.7 lb

## 2019-06-18 DIAGNOSIS — M5126 Other intervertebral disc displacement, lumbar region: Secondary | ICD-10-CM

## 2019-06-18 DIAGNOSIS — Z79891 Long term (current) use of opiate analgesic: Secondary | ICD-10-CM

## 2019-06-18 DIAGNOSIS — M5441 Lumbago with sciatica, right side: Secondary | ICD-10-CM | POA: Diagnosis not present

## 2019-06-18 DIAGNOSIS — M5442 Lumbago with sciatica, left side: Secondary | ICD-10-CM | POA: Diagnosis not present

## 2019-06-18 DIAGNOSIS — M48061 Spinal stenosis, lumbar region without neurogenic claudication: Secondary | ICD-10-CM

## 2019-06-18 DIAGNOSIS — Z79899 Other long term (current) drug therapy: Secondary | ICD-10-CM

## 2019-06-18 DIAGNOSIS — M5440 Lumbago with sciatica, unspecified side: Secondary | ICD-10-CM

## 2019-06-18 DIAGNOSIS — Z720 Tobacco use: Secondary | ICD-10-CM

## 2019-06-18 DIAGNOSIS — F17210 Nicotine dependence, cigarettes, uncomplicated: Secondary | ICD-10-CM

## 2019-06-18 DIAGNOSIS — F17200 Nicotine dependence, unspecified, uncomplicated: Secondary | ICD-10-CM

## 2019-06-18 DIAGNOSIS — G8929 Other chronic pain: Secondary | ICD-10-CM

## 2019-06-18 MED ORDER — QUETIAPINE FUMARATE 100 MG PO TABS: 200.0000 mg | ORAL_TABLET | Freq: Every day | ORAL | 0 refills | Status: DC

## 2019-06-18 MED ORDER — CLONIDINE HCL 0.1 MG PO TABS: 0.2000 mg | ORAL_TABLET | Freq: Every day | ORAL | 0 refills | Status: DC

## 2019-06-18 MED ORDER — CARISOPRODOL 350 MG PO TABS: 350.0000 mg | ORAL_TABLET | Freq: Three times a day (TID) | ORAL | 0 refills | Status: DC | PRN

## 2019-06-18 MED ORDER — AMPHETAMINE-DEXTROAMPHETAMINE 20 MG PO TABS: 20.0000 mg | ORAL_TABLET | Freq: Two times a day (BID) | ORAL | 0 refills | Status: DC

## 2019-06-18 MED ORDER — MORPHINE SULFATE ER 30 MG PO T12A: 30.0000 mg | EXTENDED_RELEASE_TABLET | Freq: Three times a day (TID) | ORAL | 0 refills | Status: DC | PRN

## 2019-06-18 MED ORDER — GABAPENTIN 800 MG PO TABS: 800.0000 mg | ORAL_TABLET | Freq: Four times a day (QID) | ORAL | 0 refills | Status: DC

## 2019-06-18 NOTE — Progress Notes (Signed)
   CC: Chronic back pain and tobacco use   HPI:Mr.Trenell Judie Petit Marzec is a 54 y.o. male who presents for evaluation of chronic back pain and tobacco use disorder. Please see individual problem based A/P for details.   Depression, PHQ-9: Based on the patients    Office Visit from 06/18/2019 in [facility]  PHQ-9 Total Score  5       Past Medical History:  Diagnosis Date  . Anxiety   . Chronic back pain   . GERD (gastroesophageal reflux disease)   . Hypertension    prior, was on medication for a few months in the past  . Lumbar disc herniation   . OCD (obsessive compulsive disorder)   . Sciatica    Review of Systems:  ROS negative except as per HPI.  Physical Exam: Vitals:   06/18/19 1513  BP: 135/78  Pulse: 70  Temp: 98 F (36.7 C)  TempSrc: Oral  SpO2: 98%  Weight: 214 lb 11.2 oz (97.4 kg)  Height: 6\' 1"  (1.854 m)    General: NAD, nl appearance HEENT: Normocephalic, atraumatic , EOMI, Conjunctivae normal Cardiovascular: Normal rate, regular rhythm.  No murmurs, rubs, or gallops Pulmonary : Effort normal, breath sounds normal. No wheezes, rales, or rhonchi Abdominal: soft, nontender,  bowel sounds present   Assessment & Plan:   See Encounters Tab for problem based charting.  Patient discussed with Dr. 

## 2019-06-18 NOTE — Patient Instructions (Signed)
Thank you for trusting me with your care. To recap, today we discussed the following:   Chronic back pain -I refilled your medication and we filled out our pain contract.  Tobacco Use Continue to think about smoking cessation I have put a order in for a low-dose CT scan to screen for lung cancer.  Make an appointment with [facility].  I have refilled your prescriptions  My best,  Thurmon Fair, MD

## 2019-06-18 NOTE — Progress Notes (Signed)
Copy of pain contract given to patient.

## 2019-06-19 ENCOUNTER — Encounter: Payer: Self-pay | Admitting: Internal Medicine
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
## 2019-06-19 NOTE — Assessment & Plan Note (Signed)
  Tobacco use disorder: Patient reports he has been a smoker his entire life.He is not ready to quit smoking , but is contemplating quitting. He is current smoker ,  age 54 with greater than 20 year history of smoking. Plan: -low dose CT scan, screening for lung cancer

## 2019-06-20 ENCOUNTER — Telehealth: Payer: Self-pay | Admitting: Internal Medicine

## 2019-06-20 NOTE — Telephone Encounter (Signed)
   Reason for call:  I received a call from [name] at 5:22 PM indicating his Morphin tablet was not sent to pharmacy yesterday and he needs it.   Pertinent Data:  He mentions that he needs his Morphine. He mentions that Dr. Barbaraann Faster wanted to send the script for me but it is not available in the pharmacy.    Assessment / Plan / Recommendations:   Patient was seen at [facility] 4/21 and started on pain contract. Per chart review, script for Morphine sent to [facility] pharmacy. I attempted to call pharmacy for clarification but no reply and the pharmacy was closed when I tried again.  I called Roberto Blair again and informed him that his script sent to pharmacy on 4/21. He appreciates the follow up and is going to call his pharmacy tomorrow.    Chevis Pretty, MD   06/20/2019, 6:43 PM

## 2019-06-23 NOTE — Progress Notes (Signed)
Internal Medicine Clinic Attending ° °Case discussed with Dr. Steen  at the time of the visit.  We reviewed the resident’s history and exam and pertinent patient test results.  I agree with the assessment, diagnosis, and plan of care documented in the resident’s note.  °

## 2019-06-25 ENCOUNTER — Ambulatory Visit: Payer: 59 | Admitting: Physical Therapy

## 2019-07-02 ENCOUNTER — Encounter: Payer: 59 | Admitting: Internal Medicine

## 2019-07-09 ENCOUNTER — Ambulatory Visit (HOSPITAL_COMMUNITY): Payer: Self-pay | Admitting: Psychiatry

## 2019-07-09 ENCOUNTER — Ambulatory Visit: Payer: 59 | Admitting: Physical Therapy

## 2019-07-16 ENCOUNTER — Ambulatory Visit: Payer: 59 | Admitting: Physical Therapy

## 2019-07-16 ENCOUNTER — Ambulatory Visit: Payer: 59 | Admitting: Internal Medicine

## 2019-07-16 DIAGNOSIS — R052 Subacute cough: Secondary | ICD-10-CM | POA: Insufficient documentation

## 2019-07-16 DIAGNOSIS — R05 Cough: Secondary | ICD-10-CM

## 2019-07-16 DIAGNOSIS — R059 Cough, unspecified: Secondary | ICD-10-CM

## 2019-07-16 MED ORDER — BENZONATATE 100 MG PO CAPS
100.0000 mg | ORAL_CAPSULE | Freq: Four times a day (QID) | ORAL | 0 refills | Status: DC | PRN
Start: 2019-07-16 — End: 2019-08-11

## 2019-07-16 NOTE — Progress Notes (Signed)
   CC: Cough  HPI:  Mr.Ac Judie Petit Spaziani is a 54 y.o. with adhd and tobacco use disorder who presents for evaluation of a cough. Please see problem based charting for evaluation, assessment, and plan.  Past Medical History:  Diagnosis Date  . Anxiety   . Chronic back pain   . GERD (gastroesophageal reflux disease)   . Hypertension    prior, was on medication for a few months in the past  . Lumbar disc herniation   . OCD (obsessive compulsive disorder)   . Sciatica    Review of Systems:    Denies fever, chills, nausea, vomiting  Physical Exam:  Vitals:   07/16/19 1003  BP: 135/71  Pulse: 92  Temp: 98.5 F (36.9 C)  TempSrc: Oral  SpO2: 97%  Weight: 213 lb 12.8 oz (97 kg)   Physical Exam  Constitutional: He appears well-developed and well-nourished. No distress.  HENT:  Head: Normocephalic and atraumatic.  Cardiovascular: Normal rate, regular rhythm and normal heart sounds.  Respiratory: Effort normal. No respiratory distress. He has no wheezes.  GI: Soft. Bowel sounds are normal. He exhibits no distension. There is no abdominal tenderness.  Musculoskeletal:        General: No edema.  Neurological: He is alert.  Skin: He is not diaphoretic.  Psychiatric: He has a normal mood and affect. His behavior is normal. Judgment and thought content normal.   Assessment & Plan:   See Encounters Tab for problem based charting.  Patient discussed with Dr. Oswaldo Done

## 2019-07-16 NOTE — Assessment & Plan Note (Addendum)
The patient states that he has been having a cough for the past 3-4 weeks that he describes as productive bringing up green colored mucus. Denies fever, chill, sneezing, runny nose. Has not had covid vaccine. Patient currently smokes.   Assessment and plan  Will test for covid and encourage the patient to quarantine till test result returns. CBC to evaluate for leukocytosis. Initially wanted to obtain chest xray to check for pneumonia, but patient stated that he was not able to wait for radiology to come down. Will order CT scan of chest due to patient's cough and risk factor of smoking.   -CT chest  -cbc -bmp -tessalon perles for cough suppressant

## 2019-07-16 NOTE — Patient Instructions (Addendum)
It was a pleasure to see you today Roberto Blair. Please make the following changes:  -I have ordered bloodwork for you  -please get ct chest done -please make sure to quarantine till you get your covid test results back    If you have any questions or concerns, please call our clinic at [phone] between 9am-5pm and after hours call [phone] and ask for the internal medicine resident on call. If you feel you are having a medical emergency please call 911.   Thank you, we look forward to help you remain healthy!  Lorenso Courier, MD Internal Medicine PGY3

## 2019-07-17 LAB — BMP8+ANION GAP
Anion Gap: 13 mmol/L (ref 10.0–18.0)
BUN/Creatinine Ratio: 11 (ref 9–20)
BUN: 9 mg/dL (ref 6–24)
CO2: 24 mmol/L (ref 20–29)
Calcium: 8.6 mg/dL — ABNORMAL LOW (ref 8.7–10.2)
Chloride: 100 mmol/L (ref 96–106)
Creatinine, Ser: 0.82 mg/dL (ref 0.76–1.27)
GFR calc Af Amer: 115 mL/min/{1.73_m2} (ref >59)
GFR calc non Af Amer: 100 mL/min/{1.73_m2} (ref >59)
Glucose: 107 mg/dL — ABNORMAL HIGH (ref 65–99)
Potassium: 4.7 mmol/L (ref 3.5–5.2)
Sodium: 137 mmol/L (ref 134–144)

## 2019-07-17 LAB — CBC
Hematocrit: 41.8 % (ref 37.5–51.0)
Hemoglobin: 13.4 g/dL (ref 13.0–17.7)
MCH: 29.1 pg (ref 26.6–33.0)
MCHC: 32.1 g/dL (ref 31.5–35.7)
MCV: 91 fL (ref 79–97)
Platelets: 270 10*3/uL (ref 150–450)
RBC: 4.6 x10E6/uL (ref 4.14–5.80)
RDW: 13 % (ref 11.6–15.4)
WBC: 12.2 10*3/uL — ABNORMAL HIGH (ref 3.4–10.8)

## 2019-07-17 LAB — SARS-COV-2, NAA 2 DAY TAT

## 2019-07-17 LAB — NOVEL CORONAVIRUS, NAA: SARS-CoV-2, NAA: NOT DETECTED

## 2019-07-17 NOTE — Progress Notes (Signed)
Internal Medicine Clinic Attending  Case discussed with Dr. Chundi at the time of the visit.  We reviewed the resident's history and exam and pertinent patient test results.  I agree with the assessment, diagnosis, and plan of care documented in the resident's note. 

## 2019-07-18 ENCOUNTER — Other Ambulatory Visit: Payer: Self-pay

## 2019-07-18 DIAGNOSIS — R41840 Attention and concentration deficit: Secondary | ICD-10-CM

## 2019-07-18 DIAGNOSIS — F5104 Psychophysiologic insomnia: Secondary | ICD-10-CM

## 2019-07-18 DIAGNOSIS — G8929 Other chronic pain: Secondary | ICD-10-CM

## 2019-07-18 MED ORDER — CLONIDINE HCL 0.1 MG PO TABS: 0.2000 mg | ORAL_TABLET | Freq: Every day | ORAL | 0 refills | Status: DC

## 2019-07-18 MED ORDER — CARISOPRODOL 350 MG PO TABS: 350.0000 mg | ORAL_TABLET | Freq: Three times a day (TID) | ORAL | 0 refills | Status: DC | PRN

## 2019-07-18 MED ORDER — MORPHINE SULFATE ER 30 MG PO T12A: 30.0000 mg | EXTENDED_RELEASE_TABLET | Freq: Three times a day (TID) | ORAL | 0 refills | Status: DC | PRN

## 2019-07-18 MED ORDER — AMPHETAMINE-DEXTROAMPHETAMINE 20 MG PO TABS: 20.0000 mg | ORAL_TABLET | Freq: Two times a day (BID) | ORAL | 0 refills | Status: DC

## 2019-07-18 MED ORDER — GABAPENTIN 800 MG PO TABS: 800.0000 mg | ORAL_TABLET | Freq: Four times a day (QID) | ORAL | 0 refills | Status: DC

## 2019-07-18 MED ORDER — QUETIAPINE FUMARATE 100 MG PO TABS: 200.0000 mg | ORAL_TABLET | Freq: Every day | ORAL | 0 refills | Status: DC

## 2019-07-18 NOTE — Telephone Encounter (Signed)
   amphetamine-dextroamphetamine (ADDERALL) 20 MG tablet        carisoprodol (SOMA) 350 MG tablet     cloNIDine (CATAPRES) 0.1 MG tablet     gabapentin (NEURONTIN) 800 MG tablet     Morphine Sulfate ER 30 MG T12A     QUEtiapine (SEROQUEL) 100 MG tablet    REFILL REQUEST @  [facility] [address], [place] - [address] [phone] (Phone) [phone] (Fax)

## 2019-07-18 NOTE — Telephone Encounter (Signed)
Roberto Blair recently established care and is on multiple centrally acting medications. Pt should be setting up appointment with psychiatry as mentioned in previous notes to manage Seroquel, Adderall, and Clonidine. I will refill all medications today, as patient has pending COVID-19 test and has been recommended to self quarantine. This will be the last month I am able to prescribe these medications. In addition , I will need plan to have patient tapered off carisoprodol. I will start taper next month when patient is out of his acute illness so not to cloud possible withdrawal symptoms. I will need to discuss taper with patient.

## 2019-07-19 ENCOUNTER — Telehealth: Payer: Self-pay | Admitting: Internal Medicine

## 2019-07-19 DIAGNOSIS — R059 Cough, unspecified: Secondary | ICD-10-CM

## 2019-07-19 DIAGNOSIS — R052 Subacute cough: Secondary | ICD-10-CM

## 2019-07-19 MED ORDER — HYDROCOD POLST-CPM POLST ER 10-8 MG/5ML PO SUER: 5.0000 mL | Freq: Two times a day (BID) | ORAL | 0 refills | Status: DC | PRN

## 2019-07-19 MED ORDER — PANTOPRAZOLE SODIUM 20 MG PO TBEC: 20.0000 mg | DELAYED_RELEASE_TABLET | Freq: Every day | ORAL | 0 refills | Status: DC

## 2019-07-19 NOTE — Telephone Encounter (Signed)
   Reason for call:   I received a call from [name] at 6:36 PM indicating chronic productive cough.   Pertinent Data:   This is a 54 year old male with a history of chronic back pain (on multiple centrally acting medications), anxiety, HTN, and OCD who was recently seen on 5/19 for cough.  Noted to be productive with green-colored mucus.  Obtained that were unremarkable CBC and BMP and given Tessalon Perles.  Chest x-ray was discussed however patient was not able to get it that day.  He reports that he is still having a cough, reports going on for 5 weeks. He reports that he is coughing a lot, daily, unable to sleep due to his cough. He took [employer] however this did not help. He Is coughing up green/yellow phlegm. He reports that he had a few subjective fevers for the first few weeks, generally sweating. He reports some chest pain when he coughs. Reports some shortness of breath with the cough. Denies any nausea or vomiting. He reports that he used an inhaler and nebulizer, amoxicillin and prednisone. He reports that he tried mucinex, mucinex PM, tablet and liquid.   Denies any recent exposures, animals, medications.  He denies any history of acid reflux however in his chart has GERD listed as a problem.  He reports that he has had tussionex in the past and it worked well for him. Discussed that this can cause sedation and confusion, espicially with his multiple other sedating medications. He reports understanding of the risks and would still like to take this.    Assessment / Plan / Recommendations:   Dicussed various causes of cough, including medication changes, pneumonia, GERD, or asthma. Will obtain chest x-ray today to assess for pneumonia.  He was afebrile with a mild leukocytosis on his last evaluation so will hold off on antibiotics at this time.   Will prescribe short course of Tussionex. Advised of side effects.  Start pantoprazole 20 mg daily.  As always, pt is  advised that if symptoms worsen or new symptoms arise, they should go to an urgent care facility or to to ER for further evaluation.   Claudean Severance, MD   07/19/2019, 6:39 PM

## 2019-07-21 NOTE — Telephone Encounter (Signed)
Please schedule pt next month w/ dr Barbaraann Faster per his note. Thank you

## 2019-07-23 ENCOUNTER — Ambulatory Visit (INDEPENDENT_AMBULATORY_CARE_PROVIDER_SITE_OTHER): Payer: 59 | Admitting: Psychiatry

## 2019-07-23 ENCOUNTER — Encounter (HOSPITAL_COMMUNITY): Payer: Self-pay | Admitting: Psychiatry

## 2019-07-23 ENCOUNTER — Other Ambulatory Visit: Payer: Self-pay

## 2019-07-23 ENCOUNTER — Ambulatory Visit (HOSPITAL_COMMUNITY)
Admission: RE | Admit: 2019-07-23 | Discharge: 2019-07-23 | Disposition: A | Discharge status: Home / Self Care | Payer: 59 | Source: Ambulatory Visit | Attending: Internal Medicine | Admitting: Internal Medicine

## 2019-07-23 VITALS — Wt 213.0 lb

## 2019-07-23 DIAGNOSIS — F319 Bipolar disorder, unspecified: Secondary | ICD-10-CM | POA: Diagnosis not present

## 2019-07-23 DIAGNOSIS — F419 Anxiety disorder, unspecified: Secondary | ICD-10-CM | POA: Diagnosis not present

## 2019-07-23 DIAGNOSIS — R05 Cough: Secondary | ICD-10-CM | POA: Diagnosis present

## 2019-07-23 DIAGNOSIS — F5104 Psychophysiologic insomnia: Secondary | ICD-10-CM | POA: Diagnosis not present

## 2019-07-23 DIAGNOSIS — R052 Subacute cough: Secondary | ICD-10-CM

## 2019-07-23 MED ORDER — BUPROPION HCL ER (XL) 150 MG PO TB24: 150.0000 mg | ORAL_TABLET | Freq: Every day | ORAL | 1 refills | Status: DC

## 2019-07-23 MED ORDER — CLONIDINE HCL 0.2 MG PO TABS: 0.2000 mg | ORAL_TABLET | Freq: Every day | ORAL | 1 refills | Status: DC
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
## 2019-07-24 ENCOUNTER — Ambulatory Visit (INDEPENDENT_AMBULATORY_CARE_PROVIDER_SITE_OTHER): Payer: 59

## 2019-07-24 ENCOUNTER — Ambulatory Visit (INDEPENDENT_AMBULATORY_CARE_PROVIDER_SITE_OTHER)
Admission: EM | Admit: 2019-07-24 | Discharge: 2019-07-24 | Disposition: A | Discharge status: Home / Self Care | Payer: 59 | Source: Home / Self Care | Attending: Family Medicine | Admitting: Family Medicine

## 2019-07-24 ENCOUNTER — Encounter (HOSPITAL_COMMUNITY): Payer: Self-pay | Admitting: Emergency Medicine

## 2019-07-24 ENCOUNTER — Other Ambulatory Visit: Payer: Self-pay

## 2019-07-24 DIAGNOSIS — R0781 Pleurodynia: Secondary | ICD-10-CM

## 2019-07-24 DIAGNOSIS — R053 Chronic cough: Secondary | ICD-10-CM

## 2019-07-24 MED ORDER — PREDNISONE 10 MG (21) PO TBPK
ORAL_TABLET | Freq: Every day | ORAL | 0 refills | Status: DC
Start: 2019-07-24 — End: 2019-08-01

## 2019-07-24 NOTE — ED Triage Notes (Signed)
PT has been sick for several days. PT was COVID negative last week. PT had a chest xray yesterday. Last night, PT experienced sharp, stabbing right sided rib/ chest pain. He believes he broke a rib.

## 2019-07-24 NOTE — ED Provider Notes (Signed)
[facility]   [id number] 07/24/19 Arrival Time: 1356  ASSESSMENT & PLAN:  1. Rib pain on right side   2. Persistent cough     Question costochondral pain. Discussed.  CXR dated 07/23/2019 reviewed by me. I have personally viewed the imaging studies ordered this visit. No pneumothorax. No rib fracture appreciated.  Begin trial of: Meds ordered this encounter  Medications  . predniSONE (STERAPRED UNI-PAK 21 TAB) 10 MG (21) TBPK tablet    Sig: Take by mouth daily. Take as directed.    Dispense:  21 tablet    Refill:  0    Recommend: Follow-up Information    Albertha Ghee, MD.   Specialty: Internal Medicine Why: As needed. Contact information: [address] [place] [address] [phone]           Agrees to ED evaluation should symptoms abruptly worsen or should he develop SOB.  Reviewed expectations re: course of current medical issues. Questions answered. Outlined signs and symptoms indicating need for more acute intervention. Patient verbalized understanding. After Visit Summary given.   SUBJECTIVE:  History from: patient. Roberto Blair is a 54 y.o. male who is a smoker presents with complaint of R-sided rib pain. Abrupt onset last evening after coughing. Has been dealing with a persistent cough for quite some time. Followed by IM at [facility]. Seen yesterday. CXR done. Questions mild SOB since feeling pain last evening. Able to sleep off/on overnight. Pain still present today. Take MSO4 for chronic pain. Also using hydrocodone cough syrup. Smokes 1 PPD for many years. Normal PO intake without n/v. Ambulatory without difficulty. Without reported back pain.  Illicit drug use: none.  Social History   Tobacco Use  Smoking Status Current Every Day Smoker  . Packs/day: 1.00  . Years: 1.00  . Pack years: 1.00  . Types: Cigarettes  Smokeless Tobacco Never Used  Tobacco Comment   1 PPD   Social History   Substance and Sexual  Activity  Alcohol Use No     OBJECTIVE:  Vitals:   07/24/19 1518  BP: 140/70  Pulse: 77  Resp: 16  Temp: 98.5 F (36.9 C)  TempSrc: Oral  SpO2: 99%    General appearance: alert, oriented, no acute distress Eyes: PERRLA; EOMI; conjunctivae normal HENT: normocephalic; atraumatic Neck: supple with FROM Lungs: without labored respirations; speaks full sentences without difficulty; CTAB Heart: regular Chest Wall: with tenderness to palpation; poorly localized over anterior right ribs Extremities: without edema Skin: warm and dry; without rash or lesions Neuro: normal gait Psychological: alert and cooperative; normal mood and affect   Imaging: DG Ribs Unilateral W/Chest Right  Result Date: 07/24/2019 CLINICAL DATA:  Right chest pain. EXAM: RIGHT RIBS AND CHEST - 3+ VIEW COMPARISON:  Jul 23, 2019. FINDINGS: No fracture or other bone lesions are seen involving the ribs. There is no evidence of pneumothorax. Small right pleural effusion is noted with adjacent subsegmental atelectasis. Heart size and mediastinal contours are within normal limits. IMPRESSION: Small right pleural effusion with adjacent subsegmental atelectasis. No rib abnormality is noted. Electronically Signed   By: Lupita Raider M.D.   On: 07/24/2019 16:41     Allergies  Allergen Reactions  . Aspirin   . Nsaids Other (See Comments)    Ulcer bleed    Past Medical History:  Diagnosis Date  . Anxiety   . Chronic back pain   . GERD (gastroesophageal reflux disease)   . Hypertension    prior, was  on medication for a few months in the past  . Lumbar disc herniation   . OCD (obsessive compulsive disorder)   . Sciatica    Social History   Socioeconomic History  . Marital status: Married    Spouse name: Not on file  . Number of children: Not on file  . Years of education: Not on file  . Highest education level: Not on file  Occupational History  . Not on file  Tobacco Use  . Smoking status: Current  Every Day Smoker    Packs/day: 1.00    Years: 1.00    Pack years: 1.00    Types: Cigarettes  . Smokeless tobacco: Never Used  . Tobacco comment: 1 PPD  Substance and Sexual Activity  . Alcohol use: No  . Drug use: No  . Sexual activity: Not on file  Other Topics Concern  . Not on file  Social History Narrative   Married, works at [employer], Engineer, drilling.  Works 6 days per week.   [employer].   02/2019   Social Determinants of Health   Financial Resource Strain:   . Difficulty of Paying Living Expenses:   Food Insecurity:   . Worried About Charity fundraiser in the Last Year:   . Arboriculturist in the Last Year:   Transportation Needs:   . Film/video editor (Medical):   Marland Kitchen Lack of Transportation (Non-Medical):   Physical Activity:   . Days of Exercise per Week:   . Minutes of Exercise per Session:   Stress:   . Feeling of Stress :   Social Connections:   . Frequency of Communication with Friends and Family:   . Frequency of Social Gatherings with Friends and Family:   . Attends Religious Services:   . Active Member of Clubs or Organizations:   . Attends Archivist Meetings:   Marland Kitchen Marital Status:   Intimate Partner Violence:   . Fear of Current or Ex-Partner:   . Emotionally Abused:   Marland Kitchen Physically Abused:   . Sexually Abused:    Family History  Family history unknown: Yes   Past Surgical History:  Procedure Laterality Date  . APPENDECTOMY    . LUMBAR EPIDURAL INJECTION    . TMJ ARTHROPLASTY    . TRIGGER POINT INJECTION       Vanessa Kick, MD 07/24/19 [phone]

## 2019-07-25 ENCOUNTER — Ambulatory Visit: Payer: 59 | Admitting: Orthopaedic Surgery

## 2019-07-27 ENCOUNTER — Other Ambulatory Visit: Payer: Self-pay

## 2019-07-27 ENCOUNTER — Encounter (HOSPITAL_COMMUNITY): Payer: Self-pay | Admitting: Emergency Medicine

## 2019-07-27 ENCOUNTER — Emergency Department (HOSPITAL_COMMUNITY): Payer: 59

## 2019-07-27 ENCOUNTER — Inpatient Hospital Stay (HOSPITAL_COMMUNITY): Payer: 59

## 2019-07-27 ENCOUNTER — Inpatient Hospital Stay (HOSPITAL_COMMUNITY)
Admission: EM | Admit: 2019-07-27 | Discharge: 2019-08-01 | DRG: 163 | Disposition: A | Discharge status: Home / Self Care | Payer: 59 | Attending: Internal Medicine | Admitting: Internal Medicine

## 2019-07-27 DIAGNOSIS — F429 Obsessive-compulsive disorder, unspecified: Secondary | ICD-10-CM | POA: Diagnosis present

## 2019-07-27 DIAGNOSIS — I10 Essential (primary) hypertension: Secondary | ICD-10-CM | POA: Diagnosis present

## 2019-07-27 DIAGNOSIS — J90 Pleural effusion, not elsewhere classified: Secondary | ICD-10-CM

## 2019-07-27 DIAGNOSIS — J918 Pleural effusion in other conditions classified elsewhere: Secondary | ICD-10-CM | POA: Diagnosis present

## 2019-07-27 DIAGNOSIS — D62 Acute posthemorrhagic anemia: Secondary | ICD-10-CM | POA: Diagnosis not present

## 2019-07-27 DIAGNOSIS — Z09 Encounter for follow-up examination after completed treatment for conditions other than malignant neoplasm: Secondary | ICD-10-CM

## 2019-07-27 DIAGNOSIS — J439 Emphysema, unspecified: Secondary | ICD-10-CM | POA: Diagnosis present

## 2019-07-27 DIAGNOSIS — J869 Pyothorax without fistula: Secondary | ICD-10-CM | POA: Diagnosis present

## 2019-07-27 DIAGNOSIS — K219 Gastro-esophageal reflux disease without esophagitis: Secondary | ICD-10-CM | POA: Diagnosis present

## 2019-07-27 DIAGNOSIS — F419 Anxiety disorder, unspecified: Secondary | ICD-10-CM | POA: Diagnosis present

## 2019-07-27 DIAGNOSIS — J159 Unspecified bacterial pneumonia: Secondary | ICD-10-CM | POA: Diagnosis not present

## 2019-07-27 DIAGNOSIS — Z9689 Presence of other specified functional implants: Secondary | ICD-10-CM

## 2019-07-27 DIAGNOSIS — Z79891 Long term (current) use of opiate analgesic: Secondary | ICD-10-CM | POA: Diagnosis not present

## 2019-07-27 DIAGNOSIS — G8929 Other chronic pain: Secondary | ICD-10-CM | POA: Diagnosis present

## 2019-07-27 DIAGNOSIS — Z20822 Contact with and (suspected) exposure to covid-19: Secondary | ICD-10-CM | POA: Diagnosis present

## 2019-07-27 DIAGNOSIS — F17210 Nicotine dependence, cigarettes, uncomplicated: Secondary | ICD-10-CM | POA: Diagnosis present

## 2019-07-27 DIAGNOSIS — R41840 Attention and concentration deficit: Secondary | ICD-10-CM | POA: Diagnosis present

## 2019-07-27 DIAGNOSIS — Z886 Allergy status to analgesic agent status: Secondary | ICD-10-CM | POA: Diagnosis not present

## 2019-07-27 DIAGNOSIS — Z720 Tobacco use: Secondary | ICD-10-CM | POA: Diagnosis present

## 2019-07-27 DIAGNOSIS — M5441 Lumbago with sciatica, right side: Secondary | ICD-10-CM | POA: Diagnosis present

## 2019-07-27 DIAGNOSIS — J189 Pneumonia, unspecified organism: Secondary | ICD-10-CM | POA: Diagnosis not present

## 2019-07-27 HISTORY — DX: Pleural effusion, not elsewhere classified: J90

## 2019-07-27 HISTORY — DX: Pneumonia, unspecified organism: J18.9

## 2019-07-27 LAB — BASIC METABOLIC PANEL
Anion gap: 12 (ref 5–15)
BUN: 9 mg/dL (ref 6–20)
CO2: 22 mmol/L (ref 22–32)
Calcium: 8.6 mg/dL — ABNORMAL LOW (ref 8.9–10.3)
Chloride: 101 mmol/L (ref 98–111)
Creatinine, Ser: 0.72 mg/dL (ref 0.61–1.24)
GFR calc Af Amer: >60 mL/min (ref >60)
GFR calc non Af Amer: >60 mL/min (ref >60)
Glucose, Bld: 143 mg/dL — ABNORMAL HIGH (ref 70–99)
Potassium: 4.2 mmol/L (ref 3.5–5.1)
Sodium: 135 mmol/L (ref 135–145)

## 2019-07-27 LAB — CBC
HCT: 40.2 % (ref 39.0–52.0)
Hemoglobin: 13.4 g/dL (ref 13.0–17.0)
MCH: 29.5 pg (ref 26.0–34.0)
MCHC: 33.3 g/dL (ref 30.0–36.0)
MCV: 88.5 fL (ref 80.0–100.0)
Platelets: 341 10*3/uL (ref 150–400)
RBC: 4.54 MIL/uL (ref 4.22–5.81)
RDW: 13.7 % (ref 11.5–15.5)
WBC: 16 10*3/uL — ABNORMAL HIGH (ref 4.0–10.5)
nRBC: 0 % (ref 0.0–0.2)

## 2019-07-27 LAB — TROPONIN I (HIGH SENSITIVITY): Troponin I (High Sensitivity): 3 ng/L (ref <18)

## 2019-07-27 LAB — D-DIMER, QUANTITATIVE: D-Dimer, Quant: 1.83 ug/mL-FEU — ABNORMAL HIGH (ref 0.00–0.50)

## 2019-07-27 LAB — SARS CORONAVIRUS 2 BY RT PCR (HOSPITAL ORDER, PERFORMED IN ~~LOC~~ HOSPITAL LAB): SARS Coronavirus 2: NEGATIVE

## 2019-07-27 LAB — CK: Total CK: 18 U/L — ABNORMAL LOW (ref 49–397)

## 2019-07-27 MED ORDER — CARISOPRODOL 350 MG PO TABS
350.0000 mg | ORAL_TABLET | Freq: Three times a day (TID) | ORAL | Status: DC | PRN
  Administered 2019-07-27 – 2019-07-29 (×5): 350 mg via ORAL
  Filled 2019-07-27 (×5): qty 1

## 2019-07-27 MED ORDER — IPRATROPIUM-ALBUTEROL 0.5-2.5 (3) MG/3ML IN SOLN: 3.0000 mL | Freq: Four times a day (QID) | RESPIRATORY_TRACT | Status: DC | PRN

## 2019-07-27 MED ORDER — ACETAMINOPHEN 650 MG RE SUPP: 650.0000 mg | Freq: Four times a day (QID) | RECTAL | Status: DC | PRN

## 2019-07-27 MED ORDER — ALBUTEROL SULFATE HFA 108 (90 BASE) MCG/ACT IN AERS
4.0000 | INHALATION_SPRAY | Freq: Once | RESPIRATORY_TRACT | Status: AC
  Administered 2019-07-27: 4 via RESPIRATORY_TRACT
  Filled 2019-07-27: qty 6.7

## 2019-07-27 MED ORDER — ACETAMINOPHEN 325 MG PO TABS: 650.0000 mg | ORAL_TABLET | Freq: Four times a day (QID) | ORAL | Status: DC | PRN

## 2019-07-27 MED ORDER — SODIUM CHLORIDE 0.9% FLUSH
3.0000 mL | Freq: Once | INTRAVENOUS | Status: AC
  Administered 2019-07-27: 3 mL via INTRAVENOUS

## 2019-07-27 MED ORDER — AMPHETAMINE-DEXTROAMPHETAMINE 10 MG PO TABS
20.0000 mg | ORAL_TABLET | Freq: Two times a day (BID) | ORAL | Status: DC
  Filled 2019-07-27 (×3): qty 2

## 2019-07-27 MED ORDER — QUETIAPINE FUMARATE 100 MG PO TABS
200.0000 mg | ORAL_TABLET | Freq: Every day | ORAL | Status: DC
  Administered 2019-07-27 – 2019-07-29 (×3): 200 mg via ORAL
  Filled 2019-07-27: qty 1
  Filled 2019-07-27 (×3): qty 2

## 2019-07-27 MED ORDER — MORPHINE SULFATE ER 15 MG PO TBCR
30.0000 mg | EXTENDED_RELEASE_TABLET | Freq: Three times a day (TID) | ORAL | Status: DC | PRN
  Administered 2019-07-27 – 2019-07-29 (×6): 30 mg via ORAL
  Filled 2019-07-27 (×7): qty 2

## 2019-07-27 MED ORDER — GABAPENTIN 400 MG PO CAPS
800.0000 mg | ORAL_CAPSULE | Freq: Four times a day (QID) | ORAL | Status: DC
  Administered 2019-07-28 – 2019-07-29 (×5): 800 mg via ORAL
  Filled 2019-07-27 (×6): qty 2

## 2019-07-27 MED ORDER — AZITHROMYCIN 250 MG PO TABS: 500.0000 mg | ORAL_TABLET | Freq: Every day | ORAL | Status: DC

## 2019-07-27 MED ORDER — PANTOPRAZOLE SODIUM 20 MG PO TBEC
20.0000 mg | DELAYED_RELEASE_TABLET | Freq: Every day | ORAL | Status: DC
  Administered 2019-07-28 – 2019-07-29 (×2): 20 mg via ORAL
  Filled 2019-07-27 (×2): qty 1

## 2019-07-27 MED ORDER — SODIUM CHLORIDE 0.9 % IV SOLN
1.0000 g | Freq: Once | INTRAVENOUS | Status: AC
  Administered 2019-07-27: 1 g via INTRAVENOUS
  Filled 2019-07-27: qty 10

## 2019-07-27 MED ORDER — IPRATROPIUM-ALBUTEROL 0.5-2.5 (3) MG/3ML IN SOLN: 3.0000 mL | Freq: Four times a day (QID) | RESPIRATORY_TRACT | Status: DC

## 2019-07-27 MED ORDER — AZITHROMYCIN 250 MG PO TABS
500.0000 mg | ORAL_TABLET | Freq: Once | ORAL | Status: AC
  Administered 2019-07-27: 500 mg via ORAL
  Filled 2019-07-27: qty 2

## 2019-07-27 MED ORDER — SODIUM CHLORIDE 0.9 % IV SOLN
1.0000 g | INTRAVENOUS | Status: DC
  Administered 2019-07-28: 1 g via INTRAVENOUS
  Filled 2019-07-27 (×2): qty 10

## 2019-07-27 MED ORDER — IOHEXOL 350 MG/ML SOLN
75.0000 mL | Freq: Once | INTRAVENOUS | Status: AC | PRN
  Administered 2019-07-27: 75 mL via INTRAVENOUS

## 2019-07-27 MED ORDER — UMECLIDINIUM-VILANTEROL 62.5-25 MCG/INH IN AEPB
1.0000 | INHALATION_SPRAY | Freq: Every day | RESPIRATORY_TRACT | Status: DC | PRN
  Filled 2019-07-27: qty 14

## 2019-07-27 MED ORDER — ENOXAPARIN SODIUM 40 MG/0.4ML ~~LOC~~ SOLN
40.0000 mg | SUBCUTANEOUS | Status: DC
  Administered 2019-07-27 – 2019-07-28 (×2): 40 mg via SUBCUTANEOUS
  Filled 2019-07-27 (×2): qty 0.4

## 2019-07-27 MED ORDER — CLONIDINE HCL 0.1 MG PO TABS
0.2000 mg | ORAL_TABLET | Freq: Every day | ORAL | Status: DC
  Administered 2019-07-27 – 2019-07-29 (×3): 0.2 mg via ORAL
  Filled 2019-07-27 (×3): qty 1

## 2019-07-27 NOTE — ED Triage Notes (Signed)
Pt reports cough with yellowish-green phlegm x 5 weeks that he has been seen numerous times for.  Reports pain across chest and back and difficulty taking a deep breath.  Reports negative COVID test within the last 2 weeks.

## 2019-07-27 NOTE — H&P (Signed)
Date: 07/27/2019               Patient Name:  Roberto Blair MRN: [id number]  DOB: 15-Nov-1965 Age / Sex: 54 y.o., male   PCP: Albertha Ghee, MD         Medical Service: Internal Medicine Teaching Service         Attending Physician: Dr. Sandre Kitty Elwin Mocha, MD    First Contact: Dr. Darl Pikes Pager: [phone]  Second Contact: Dr. Cleaster Corin Pager: [phone]       After Hours (After 5p/  First Contact Pager: [phone]  weekends / holidays): Second Contact Pager: [phone]   Chief Complaint: cough, SOB and chest pain  History of Present Illness: Roberto Blair is a 54 yo M w/ a PMHx notable for GERD, anxiety, HTN, OCD, sciatica, and chronic opioid use disorder who presented with progressively worsening chest pain, cough and shortness of breath. The patient stated that his cough began 5-6 weeks prior with the shortness of breath developing one week ago. This was followed by chest pain about four days prior that is worse with coughing, on the right and spread across his entire right lower chest and into his back. He denied fever but endorsed night sweats and chills with up to 10-15lbs of weight loss due to a poor diet. He is hungry today but continues to perseverate on the pain as the primary concern now. His home medications have been insufficient to treat the pain. He denied known sick contacts and has tested negative for COVID recently. Patient went to his PCP office on the 19th where a CT scan of the chest was ordered but patient had yet to complete this. He went to urgent care on the 27th where a chest x-ray was rather unremarkable.  Patient denied associated symptoms.  He denies abdominal pain, dysuria, hematuria, loose stool, constipation, rash, joint aches, muscle pain, headache or vision changes.  In the ED patient was noted to have marked increase of a right-sided effusion concerning for a parapneumonic effusion with findings most suggestive of pneumonia.  There was sequentially elevated D-dimer  1.83 which prompted a CT angio PE study which further delineated the right-sided effusion to consolidation of the right middle and lower lobes with postobstructive atelectasis or pneumonia being most likely.  They could not rule out a centrally affecting mass/neoplasm.  Patients CBC had marked leukocytosis of 16K/uL up from 12.2K/uL just 11 days prior (not chronically elevated per chart review).  Admission was requested probable pneumonia with moderate/severe associated pleuritic pain.  Meds:  Current Meds  Medication Sig  . albuterol (VENTOLIN HFA) 108 (90 Base) MCG/ACT inhaler Inhale 2 puffs into the lungs every 6 (six) hours as needed for wheezing or shortness of breath.  . amphetamine-dextroamphetamine (ADDERALL) 20 MG tablet Take 1 tablet (20 mg total) by mouth 2 (two) times daily. (Patient taking differently: Take 20 mg by mouth See admin instructions. Take one tablet (20 mg) by mouth twice daily - early morning and at lunch (may also take 2 tablets (40 mg) every morning instead of twice daily))  . carisoprodol (SOMA) 350 MG tablet Take 1 tablet (350 mg total) by mouth 3 (three) times daily as needed for muscle spasms. (Patient taking differently: Take 350 mg by mouth in the morning, at noon, and at bedtime. )  . chlorpheniramine-HYDROcodone (TUSSIONEX PENNKINETIC ER) 10-8 MG/5ML SUER Take 5 mLs by mouth every 12 (twelve) hours as needed for cough.  . cloNIDine (CATAPRES) 0.1 MG tablet  Take 0.2 mg by mouth at bedtime.  . gabapentin (NEURONTIN) 800 MG tablet Take 1 tablet (800 mg total) by mouth 4 (four) times daily. (Patient taking differently: Take 800 mg by mouth in the morning, at noon, and at bedtime. )  . Morphine Sulfate ER 30 MG T12A Take 30 mg by mouth 3 (three) times daily as needed. (Patient taking differently: Take 30 mg by mouth in the morning, at noon, and at bedtime. )  . pantoprazole (PROTONIX) 20 MG tablet Take 1 tablet (20 mg total) by mouth daily.  . predniSONE (STERAPRED UNI-PAK  21 TAB) 10 MG (21) TBPK tablet Take by mouth daily. Take as directed. (Patient taking differently: Take 10-60 mg by mouth See admin instructions. Tapered course started 07/24/2019: Take 6 tablets (60 mg) by mouth on day 1, then take 5 tablets (50 mg)  on day 2, then take 4 tablets (40 mg)  on day 3, then take 3 tablets (30 mg) on day 4, then take 2 tablets (20 mg) on day 5, then take 1 tablet (10 mg) on day 6, then stop.)  . QUEtiapine (SEROQUEL) 100 MG tablet Take 2 tablets (200 mg total) by mouth at bedtime.  Marland Kitchen umeclidinium-vilanterol (ANORO ELLIPTA) 62.5-25 MCG/INH AEPB Inhale 1 puff into the lungs daily as needed (shortness of breath).   Allergies: Allergies as of 07/27/2019 - Review Complete 07/27/2019  Allergen Reaction Noted  . Aspirin Other (See Comments) 08/31/2010  . Nsaids Other (See Comments) 02/06/2012   Past Medical History:  Diagnosis Date  . Anxiety   . Chronic back pain   . GERD (gastroesophageal reflux disease)   . Hypertension    prior, was on medication for a few months in the past  . Lumbar disc herniation   . OCD (obsessive compulsive disorder)   . Sciatica    Family History:  Family History  Family history unknown: Yes  Patient denied known family history.   Social History:  Social History   Tobacco Use  . Smoking status: Current Every Day Smoker    Packs/day: 1.00    Years: 40.00    Pack years: 40.00    Types: Cigarettes  . Smokeless tobacco: Never Used  . Tobacco comment: 1 PPD  Substance Use Topics  . Alcohol use: No  . Drug use: No  Confirmed the above.   Review of Systems: A complete ROS was negative except as per HPI.   Physical Exam: Blood pressure 129/78, pulse 77, temperature 98.4 F (36.9 C), temperature source Oral, resp. rate 18, height 6' 0.1" (1.831 m), SpO2 93 %. Physical Exam Constitutional:      General: He is not in acute distress.    Appearance: He is well-developed. He is not diaphoretic.  HENT:     Head: Normocephalic and  atraumatic.  Eyes:     Extraocular Movements: Extraocular movements intact.     Conjunctiva/sclera: Conjunctivae normal.     Pupils: Pupils are equal, round, and reactive to light.  Cardiovascular:     Rate and Rhythm: Normal rate and regular rhythm.     Heart sounds: No murmur.  Pulmonary:     Effort: Pulmonary effort is normal. No respiratory distress.     Breath sounds: No stridor. Examination of the right-lower field reveals decreased breath sounds. Decreased breath sounds and rales present. No wheezing or rhonchi.  Chest:     Chest wall: No mass, deformity, tenderness, crepitus or edema. There is dullness to percussion (RLL field).  Abdominal:  General: Bowel sounds are normal. There is no distension.     Palpations: Abdomen is soft.     Tenderness: There is no guarding or rebound.  Musculoskeletal:     Cervical back: Normal range of motion.     Right lower leg: No tenderness. No edema.     Left lower leg: No tenderness. No edema.  Skin:    General: Skin is warm.     Capillary Refill: Capillary refill takes less than 2 seconds.  Neurological:     General: No focal deficit present.     Mental Status: He is alert and oriented to person, place, and time.  Psychiatric:        Mood and Affect: Mood normal. Mood is not anxious.        Behavior: Behavior normal.    EKG: personally reviewed my interpretation is NSR no ST elevation or depression  CXR: personally reviewed my interpretation is Marked increase in RIGHT pleural effusion is concerning for parapneumonic effusion. Findings suggest RIGHT lower lobe pneumonia. Followup PA and lateral chest X-ray is recommended in 3-4 weeks following trial of antibiotic therapy to ensure resolution and exclude underlying MALIGNANCY.  CTA Chest PE study:  IMPRESSION: 1. No CT evidence of pulmonary embolism. 2. Large area of consolidation involving the right middle and right lower lobes consistent with post obstructive atelectasis  or pneumonia. A centrally obstructing mass/neoplasm is not excluded clinical correlation and close follow-up recommended. Bronchoscopy may provide better evaluation. 3. Secretions within the right middle and right lower lobe bronchi. 4. Moderate loculated appearing right pleural effusion with extension into the right fissures. 5. Right hilar and mediastinal adenopathy. 6. Emphysema (ICD10-J43.9).  Assessment & Plan by Problem: Principal Problem:   Pneumonia Active Problems:   Chronic bilateral low back pain with sciatica   Tobacco use   Attention and concentration deficit   Chronic prescription opiate use   Emphysema of lung (HCC)   Parapneumonic effusion  Summary: Roberto Blair is a 54 yo M w/ a PMHx notable for GERD, anxiety, HTN, OCD, sciatica, and chronic opioid use disorder who presented with progressively worsening chest pain, cough and shortness of breath this concerning for pneumonia versus post obstructive effusion.  Given severity is pleuritic pain shortness of breath admission was requested.  Assessment and Plan: CAP: Parapneumonic effusion: Pleuritis: Probable pneumonia given his presentation but I agree that consideration for an obstructive neoplasm is high and repeat imaging in 4-6 weeks should be completed. This was rapidly developing when comparing his CXR from 5/27 to today. Given his hemodynamic stability but acutely worsening effusion he will likely need pleurx catheter placed given the moderate loculated pleural effusion. However, we have requested an IR consult for thoracentesis consideration if the effusion is amenable to this. Some concern for mycobacterial process vs fungal process. I suspect that the chest pain is most likely pleuritis due to his effusion. Troponin was 3, and EKG unremarkable. No overt MSK pathology. Plan: -Continue CTX  -Start Metronidazole (for loculated effusion, suspecting anaerobic coverage need) -IR consult for consideration for a  thoracentesis -Continue home morphine (MS contin) 30mg  q12 hours -duonebs q6 hours PRN for wheezing or dyspnea -Ordered flutter valve and incentive spirometry   Emphysema of the lung: Noted on Chest CT, which is in conjunction with his smoking history. May need alpha-one antitrypsin testing as an outpatient but will defer to PCP.  Plan: -Continue home Lama/Laba combo Anoro ellipta -Duonebs continued -F/up with PFT's as an outpatient  Chronic tobacco use:  Recommended cessation. Patient is not ready to quit. Continue outpatient guidance.   Attention and concentration deficit: Continue home Adderall. Follow-up with psychiatry and PCP.  Chronic bilateral lower back pain with sciatica Chronic prescription opioid use disorder: Continue home morphine and gabapentin, will defer further adjustments in treatment to PCP and psychiatry.  Diet: Regular Code: Full DVT PPX: Enoxaparin Fluids/electrolytes: Stable, no Tx indicated Dispo: Admit patient to Inpatient with expected length of stay greater than 2 midnights.  Signed: Lanelle Bal, MD [facility] Internal Medicine, PGY-3  Please see Attending A/P and/or Addendum for final recommendations.

## 2019-07-27 NOTE — ED Notes (Signed)
Pt desat to 88% on RA while sleeping. Applied 2L Scraper, improved to 93%. EDP notified

## 2019-07-27 NOTE — ED Provider Notes (Signed)
[facility] EMERGENCY DEPARTMENT Provider Note   CSN: [id number] Arrival date & time: 07/27/19  1438     History Chief Complaint  Patient presents with  . Cough  . Shortness of Breath  . Chest Pain    Roberto Blair is a 54 y.o. male.  HPI   Patient presented to ED for evaluation of cough and chest pain that started approximately 5 weeks ago.  Patient states he has had a persistent cough.  Initially he was having some pain across the right side of his chest but it has moved towards both sides.  Pain interferes with his ability to take a deep breath.  Increases with coughing.  Patient has been seen before for this and initially was started on antibiotics including amoxicillin.  He has also taken steroids.  Patient states in the mornings he has had a couple episodes where he has had severe coughing pain.  He gets lightheaded and feels like he has to urinate.  The pain does go from both sides to his back.  He denies any fevers.  He has not been vaccinated for Covid but did test negative on 5/19.  Pt does smoke but has been cutting back.  Past Medical History:  Diagnosis Date  . Anxiety   . Chronic back pain   . GERD (gastroesophageal reflux disease)   . Hypertension    prior, was on medication for a few months in the past  . Lumbar disc herniation   . OCD (obsessive compulsive disorder)   . Sciatica     Patient Active Problem List   Diagnosis Date Noted  . Subacute cough 07/16/2019  . Chronic prescription opiate use 04/08/2019  . Contusion of left elbow 03/11/2019  . Chronic bilateral low back pain with sciatica 03/11/2019  . Tobacco use 03/11/2019  . Bilateral chronic knee pain 03/11/2019  . Other chronic pain 03/11/2019  . Attention and concentration deficit 03/11/2019  . Allergic reaction to nonsteroidal anti-inflammatory drug (NSAID) 03/11/2019  . Chronic insomnia 03/11/2019    Past Surgical History:  Procedure Laterality Date  . APPENDECTOMY      . LUMBAR EPIDURAL INJECTION    . TMJ ARTHROPLASTY    . TRIGGER POINT INJECTION         Family History  Family history unknown: Yes    Social History   Tobacco Use  . Smoking status: Current Every Day Smoker    Packs/day: 1.00    Years: 1.00    Pack years: 1.00    Types: Cigarettes  . Smokeless tobacco: Never Used  . Tobacco comment: 1 PPD  Substance Use Topics  . Alcohol use: No  . Drug use: No    Home Medications Prior to Admission medications   Medication Sig Start Date End Date Taking? Authorizing Provider  amphetamine-dextroamphetamine (ADDERALL) 20 MG tablet Take 1 tablet (20 mg total) by mouth 2 (two) times daily. 07/18/19   Madalyn Rob, MD  benzonatate (TESSALON PERLES) 100 MG capsule Take 1 capsule (100 mg total) by mouth every 6 (six) hours as needed for cough. 07/16/19 08/15/19  Chundi, Verne Spurr, MD  buPROPion (WELLBUTRIN XL) 150 MG 24 hr tablet Take 1 tablet (150 mg total) by mouth daily. 07/23/19   Arfeen, Arlyce Harman, MD  carisoprodol (SOMA) 350 MG tablet Take 1 tablet (350 mg total) by mouth 3 (three) times daily as needed for muscle spasms. 07/18/19   Madalyn Rob, MD  chlorpheniramine-HYDROcodone [facility]) 10-8 MG/5ML SUER Take 5  mLs by mouth every 12 (twelve) hours as needed for cough. 07/19/19   Claudean Severance, MD  cloNIDine (CATAPRES) 0.2 MG tablet Take 1 tablet (0.2 mg total) by mouth at bedtime. 07/23/19   Arfeen, Phillips Grout, MD  gabapentin (NEURONTIN) 800 MG tablet Take 1 tablet (800 mg total) by mouth 4 (four) times daily. 07/18/19   Albertha Ghee, MD  Morphine Sulfate ER 30 MG T12A Take 30 mg by mouth 3 (three) times daily as needed. 07/18/19   Albertha Ghee, MD  pantoprazole (PROTONIX) 20 MG tablet Take 1 tablet (20 mg total) by mouth daily. 07/19/19   Claudean Severance, MD  predniSONE (STERAPRED UNI-PAK 21 TAB) 10 MG (21) TBPK tablet Take by mouth daily. Take as directed. 07/24/19   Mardella Layman, MD  QUEtiapine (SEROQUEL) 100 MG tablet Take 2 tablets  (200 mg total) by mouth at bedtime. 07/18/19   Albertha Ghee, MD  clonazePAM (KLONOPIN) 1 MG tablet Take 1 mg by mouth 2 (two) times daily as needed.  03/09/19  [provider]    Allergies    Aspirin and Nsaids  Review of Systems   Review of Systems  All other systems reviewed and are negative.   Physical Exam Updated Vital Signs BP 138/70 (BP Location: Right Arm)   Pulse 80   Temp 99.3 F (37.4 C) (Oral)   Resp 19   SpO2 95%   Physical Exam Vitals and nursing note reviewed.  Constitutional:      General: He is not in acute distress.    Appearance: He is well-developed.  HENT:     Head: Normocephalic and atraumatic.     Right Ear: External ear normal.     Left Ear: External ear normal.  Eyes:     General: No scleral icterus.       Right eye: No discharge.        Left eye: No discharge.     Conjunctiva/sclera: Conjunctivae normal.  Neck:     Trachea: No tracheal deviation.  Cardiovascular:     Rate and Rhythm: Normal rate and regular rhythm.  Pulmonary:     Effort: Pulmonary effort is normal. No respiratory distress.     Breath sounds: Normal breath sounds. No stridor. No wheezing or rales.  Chest:     Chest wall: Tenderness present. No crepitus.  Abdominal:     General: Bowel sounds are normal. There is no distension.     Palpations: Abdomen is soft.     Tenderness: There is no abdominal tenderness. There is no guarding or rebound.  Musculoskeletal:        General: No tenderness.     Cervical back: Neck supple.  Skin:    General: Skin is warm and dry.     Findings: No rash.  Neurological:     Mental Status: He is alert.     Cranial Nerves: No cranial nerve deficit (no facial droop, extraocular movements intact, no slurred speech).     Sensory: No sensory deficit.     Motor: No abnormal muscle tone or seizure activity.     Coordination: Coordination normal.     ED Results / Procedures / Treatments   Labs (all labs ordered are listed, but only  abnormal results are displayed) Labs Reviewed  BASIC METABOLIC PANEL - Abnormal; Notable for the following components:      Result Value   Glucose, Bld 143 (*)    Calcium 8.6 (*)    All other components within  normal limits  CBC - Abnormal; Notable for the following components:   WBC 16.0 (*)    All other components within normal limits  SARS CORONAVIRUS 2 BY RT PCR (HOSPITAL ORDER, PERFORMED IN [facility])  D-DIMER, QUANTITATIVE (NOT AT [facility])  CK  TROPONIN I (HIGH SENSITIVITY)    EKG None  Radiology DG Chest 2 View  Result Date: 07/27/2019 CLINICAL DATA:  Cough.  Phlegm EXAM: CHEST - 2 VIEW COMPARISON:  Radiograph 07/24/2019 FINDINGS: Normal cardiac silhouette. Significant increase in RIGHT pleural effusion in short interval follow-up. RIGHT basilar atelectasis noted. LEFT lung clear. Upper lobes clear. IMPRESSION: Marked increase in RIGHT pleural effusion is concerning for parapneumonic effusion. Findings suggest RIGHT lower lobe pneumonia. Followup PA and lateral chest X-ray is recommended in 3-4 weeks following trial of antibiotic therapy to ensure resolution and exclude underlying MALIGNANCY. Electronically Signed   By: Genevive Bi M.D.   On: 07/27/2019 15:17    Procedures Procedures (including critical care time)  Medications Ordered in ED Medications  cefTRIAXone (ROCEPHIN) 1 g in sodium chloride 0.9 % 100 mL IVPB (1 g Intravenous New Bag/Given 07/27/19 1904)  sodium chloride flush (NS) 0.9 % injection 3 mL (3 mLs Intravenous Given 07/27/19 1551)  albuterol (VENTOLIN HFA) 108 (90 Base) MCG/ACT inhaler 4 puff (4 puffs Inhalation Given 07/27/19 1546)  azithromycin (ZITHROMAX) tablet 500 mg (500 mg Oral Given 07/27/19 1904)    ED Course  I have reviewed the triage vital signs and the nursing notes.  Pertinent labs & imaging results that were available during my care of the patient were reviewed by me and considered in my medical decision making (see chart for  details).  Clinical Course as of Jul 27 1907  Wynelle Link Jul 27, 2019  1535 Trop ordered at triage.  Low suspicion for cardiac etiology based on his cough sx   [JK]  1732 Patient's white blood cell count is elevated.  Electrolyte panel is unremarkable.  D-dimer is currently pending.  Covid test is negative   [JK]  1733 Chest x-ray shows probable infiltrate in the right lower lung and increasing effusion.  Most likely related to a parapneumonic effusion.   [JK]    Clinical Course User Index [JK] Linwood Dibbles, MD   MDM Rules/Calculators/A&P                      Patient presented to the emergency room with persistent cough.  Patient has been on antibiotics as an outpatient.  Patient's laboratory tests do show an elevated white blood cell count.  He otherwise does not show any signs of hemodynamic instability.  Patient's Covid test is negative.  His chest x-ray shows right lower lobe infiltrate and increasing effusion suggestive of a parapneumonic effusion.  Patient does have an oxygen requirement now.  Considering he is need for nasal cannula oxygen and his failure on outpatient antibiotics I think it is reasonable to bring him in for IV antibiotics.  D-dimer is pending.  If positive will perform a CT angiogram of the chest.   Final Clinical Impression(s) / ED Diagnoses Final diagnoses:  Pneumonia of right lower lobe due to infectious organism  Pleural effusion      Linwood Dibbles, MD 07/27/19 1909

## 2019-07-27 NOTE — Assessment & Plan Note (Signed)
Continues to smoke > 1 ppd 

## 2019-07-28 ENCOUNTER — Inpatient Hospital Stay (HOSPITAL_COMMUNITY): Payer: 59

## 2019-07-28 LAB — COMPREHENSIVE METABOLIC PANEL
ALT: 29 U/L (ref 0–44)
AST: 20 U/L (ref 15–41)
Albumin: 2.5 g/dL — ABNORMAL LOW (ref 3.5–5.0)
Alkaline Phosphatase: 115 U/L (ref 38–126)
Anion gap: 9 (ref 5–15)
BUN: 8 mg/dL (ref 6–20)
CO2: 27 mmol/L (ref 22–32)
Calcium: 8.5 mg/dL — ABNORMAL LOW (ref 8.9–10.3)
Chloride: 98 mmol/L (ref 98–111)
Creatinine, Ser: 0.74 mg/dL (ref 0.61–1.24)
GFR calc Af Amer: >60 mL/min (ref >60)
GFR calc non Af Amer: >60 mL/min (ref >60)
Glucose, Bld: 105 mg/dL — ABNORMAL HIGH (ref 70–99)
Potassium: 3.9 mmol/L (ref 3.5–5.1)
Sodium: 134 mmol/L — ABNORMAL LOW (ref 135–145)
Total Bilirubin: 0.8 mg/dL (ref 0.3–1.2)
Total Protein: 6.6 g/dL (ref 6.5–8.1)

## 2019-07-28 LAB — PROTEIN, PLEURAL OR PERITONEAL FLUID: Total protein, fluid: 4.7 g/dL

## 2019-07-28 LAB — BODY FLUID CELL COUNT WITH DIFFERENTIAL
Eos, Fluid: 1 %
Lymphs, Fluid: 7 %
Monocyte-Macrophage-Serous Fluid: 23 % — ABNORMAL LOW (ref 50–90)
Neutrophil Count, Fluid: 69 % — ABNORMAL HIGH (ref 0–25)
Total Nucleated Cell Count, Fluid: 105000 cu mm — ABNORMAL HIGH (ref 0–1000)

## 2019-07-28 LAB — EXPECTORATED SPUTUM ASSESSMENT W GRAM STAIN, RFLX TO RESP C

## 2019-07-28 LAB — CBC
HCT: 37.7 % — ABNORMAL LOW (ref 39.0–52.0)
Hemoglobin: 12.4 g/dL — ABNORMAL LOW (ref 13.0–17.0)
MCH: 29.4 pg (ref 26.0–34.0)
MCHC: 32.9 g/dL (ref 30.0–36.0)
MCV: 89.3 fL (ref 80.0–100.0)
Platelets: 284 10*3/uL (ref 150–400)
RBC: 4.22 MIL/uL (ref 4.22–5.81)
RDW: 13.8 % (ref 11.5–15.5)
WBC: 13 10*3/uL — ABNORMAL HIGH (ref 4.0–10.5)
nRBC: 0 % (ref 0.0–0.2)

## 2019-07-28 LAB — GLUCOSE, PLEURAL OR PERITONEAL FLUID: Glucose, Fluid: 106 mg/dL

## 2019-07-28 LAB — ALBUMIN, PLEURAL OR PERITONEAL FLUID: Albumin, Fluid: 2.1 g/dL

## 2019-07-28 LAB — PROTEIN, TOTAL: Total Protein: 7 g/dL (ref 6.5–8.1)

## 2019-07-28 LAB — PROCALCITONIN: Procalcitonin: <0.1 ng/mL

## 2019-07-28 LAB — LACTATE DEHYDROGENASE: LDH: 97 U/L — ABNORMAL LOW (ref 98–192)

## 2019-07-28 LAB — GRAM STAIN

## 2019-07-28 LAB — HIV ANTIBODY (ROUTINE TESTING W REFLEX): HIV Screen 4th Generation wRfx: NONREACTIVE

## 2019-07-28 LAB — LACTATE DEHYDROGENASE, PLEURAL OR PERITONEAL FLUID: LD, Fluid: 335 U/L — ABNORMAL HIGH (ref 3–23)

## 2019-07-28 MED ORDER — LIDOCAINE HCL (PF) 1 % IJ SOLN
INTRAMUSCULAR | Status: AC
  Filled 2019-07-28: qty 30

## 2019-07-28 MED ORDER — METRONIDAZOLE IN NACL 5-0.79 MG/ML-% IV SOLN
500.0000 mg | Freq: Three times a day (TID) | INTRAVENOUS | Status: DC
  Administered 2019-07-28 – 2019-07-29 (×5): 500 mg via INTRAVENOUS
  Filled 2019-07-28 (×5): qty 100

## 2019-07-28 NOTE — Procedures (Signed)
PROCEDURE SUMMARY:  Small, loculated effusion. Only approachable pocket was more superior.  Successful US guided right thoracentesis. Yielded only of hazy amber fluid. Pt tolerated procedure well. No immediate complications.  Specimen was sent for labs. CXR ordered.  EBL < 5 mL  Brayton El PA-C 07/28/2019 1:12 PM

## 2019-07-28 NOTE — Progress Notes (Signed)
Subjective: NAE overnight. Reports it's hard to breath because of right sided chest discomfort. Discussed thoracentesis and antibiotics. No other complaints or concerns.  Consults: IR  Objective:  Vital signs in last 24 hours: Vitals:   07/27/19 2110 07/27/19 2143 07/28/19 0618 07/28/19 0744  BP:  129/78 110/69 (!) 131/97  Pulse: 77 77 77 76  Resp: 17 18 18 15   Temp:  98.4 F (36.9 C) 98.5 F (36.9 C) 97.7 F (36.5 C)  TempSrc:  Oral Oral   SpO2: 96% 93% 95% 92%  Height:  6' 0.1" (1.831 m)     General: non-toxic appearing Cardiac: RRR Pulm: breathing comfortably on room air; decreased breath sounds RLL  I/Os:  Intake/Output Summary (Last 24 hours) at 07/28/2019 1020 Last data filed at 07/27/2019 1938 Gross per 24 hour  Intake 100 ml  Output --  Net 100 ml    Telemetry:  Labs: Results for orders placed or performed during the hospital encounter of 07/27/19 (from the past 24 hour(s))  Basic metabolic panel     Status: Abnormal   Collection Time: 07/27/19  2:58 PM  Result Value Ref Range   Sodium 135 135 - 145 mmol/L   Potassium 4.2 3.5 - 5.1 mmol/L   Chloride 101 98 - 111 mmol/L   CO2 22 22 - 32 mmol/L   Glucose, Bld 143 (H) 70 - 99 mg/dL   BUN 9 6 - 20 mg/dL   Creatinine, Ser 07/29/19 0.61 - 1.24 mg/dL   Calcium 8.6 (L) 8.9 - 10.3 mg/dL   GFR calc non Af Amer >60 >60 mL/min   GFR calc Af Amer >60 >60 mL/min   Anion gap 12 5 - 15  CBC     Status: Abnormal   Collection Time: 07/27/19  2:58 PM  Result Value Ref Range   WBC 16.0 (H) 4.0 - 10.5 K/uL   RBC 4.54 4.22 - 5.81 MIL/uL   Hemoglobin 13.4 13.0 - 17.0 g/dL   HCT 07/29/19 48.5 - 46.2 %   MCV 88.5 80.0 - 100.0 fL   MCH 29.5 26.0 - 34.0 pg   MCHC 33.3 30.0 - 36.0 g/dL   RDW 70.3 50.0 - 93.8 %   Platelets 341 150 - 400 K/uL   nRBC 0.0 0.0 - 0.2 %  Troponin I (High Sensitivity)     Status: None   Collection Time: 07/27/19  2:58 PM  Result Value Ref Range   Troponin I (High Sensitivity) 3 <18 ng/L  D-dimer,  quantitative (not at [facility])     Status: Abnormal   Collection Time: 07/27/19  2:58 PM  Result Value Ref Range   D-Dimer, Quant 1.83 (H) 0.00 - 0.50 ug/mL-FEU  CK     Status: Abnormal   Collection Time: 07/27/19  2:58 PM  Result Value Ref Range   Total CK 18 (L) 49 - 397 U/L  SARS Coronavirus 2 by RT PCR (hospital order, performed in [facility] hospital lab) Nasopharyngeal Nasopharyngeal Swab     Status: None   Collection Time: 07/27/19  3:35 PM   Specimen: Nasopharyngeal Swab  Result Value Ref Range   SARS Coronavirus 2 NEGATIVE NEGATIVE  CBC     Status: Abnormal   Collection Time: 07/28/19  4:32 AM  Result Value Ref Range   WBC 13.0 (H) 4.0 - 10.5 K/uL   RBC 4.22 4.22 - 5.81 MIL/uL   Hemoglobin 12.4 (L) 13.0 - 17.0 g/dL   HCT 07/30/19 (L) 99.3 - 71.6 %  MCV 89.3 80.0 - 100.0 fL   MCH 29.4 26.0 - 34.0 pg   MCHC 32.9 30.0 - 36.0 g/dL   RDW 38.1 77.1 - 16.5 %   Platelets 284 150 - 400 K/uL   nRBC 0.0 0.0 - 0.2 %  Comprehensive metabolic panel     Status: Abnormal   Collection Time: 07/28/19  4:32 AM  Result Value Ref Range   Sodium 134 (L) 135 - 145 mmol/L   Potassium 3.9 3.5 - 5.1 mmol/L   Chloride 98 98 - 111 mmol/L   CO2 27 22 - 32 mmol/L   Glucose, Bld 105 (H) 70 - 99 mg/dL   BUN 8 6 - 20 mg/dL   Creatinine, Ser 7.90 0.61 - 1.24 mg/dL   Calcium 8.5 (L) 8.9 - 10.3 mg/dL   Total Protein 6.6 6.5 - 8.1 g/dL   Albumin 2.5 (L) 3.5 - 5.0 g/dL   AST 20 15 - 41 U/L   ALT 29 0 - 44 U/L   Alkaline Phosphatase 115 38 - 126 U/L   Total Bilirubin 0.8 0.3 - 1.2 mg/dL   GFR calc non Af Amer >60 >60 mL/min   GFR calc Af Amer >60 >60 mL/min   Anion gap 9 5 - 15    Imaging: DG Chest 2 View  Result Date: 07/27/2019 CLINICAL DATA:  Cough.  Phlegm EXAM: CHEST - 2 VIEW COMPARISON:  Radiograph 07/24/2019 FINDINGS: Normal cardiac silhouette. Significant increase in RIGHT pleural effusion in short interval follow-up. RIGHT basilar atelectasis noted. LEFT lung clear. Upper lobes clear.  IMPRESSION: Marked increase in RIGHT pleural effusion is concerning for parapneumonic effusion. Findings suggest RIGHT lower lobe pneumonia. Followup PA and lateral chest X-ray is recommended in 3-4 weeks following trial of antibiotic therapy to ensure resolution and exclude underlying MALIGNANCY. Electronically Signed   By: Genevive Bi M.D.   On: 07/27/2019 15:17   CT Angio Chest PE W and/or Wo Contrast  Result Date: 07/27/2019 CLINICAL DATA:  54 year old male with shortness of breath. EXAM: CT ANGIOGRAPHY CHEST WITH CONTRAST TECHNIQUE: Multidetector CT imaging of the chest was performed using the standard protocol during bolus administration of intravenous contrast. Multiplanar CT image reconstructions and MIPs were obtained to evaluate the vascular anatomy. CONTRAST:  67mL OMNIPAQUE IOHEXOL 350 MG/ML SOLN COMPARISON:  Chest radiograph dated 07/27/2019. FINDINGS: Cardiovascular: Top-normal cardiac size. No pericardial effusion. The thoracic aorta is unremarkable. The origins of the great vessels of the aortic arch appear patent. No pulmonary artery embolus identified. Mediastinum/Nodes: Right hilar adenopathy measures 2 cm. Subcarinal lymph nodes measure 15 mm in short axis. Right paratracheal lymph node measures 13 mm in short axis. The esophagus and the thyroid gland are grossly unremarkable. No mediastinal fluid collection. Multiple mildly enlarged and rounded lymph nodes noted along the right cardiophrenic angle and adjacent to the GE junction. These may be reactive although metastatic disease is not excluded. Lungs/Pleura: Moderate loculated appearing right pleural effusion with extension into the right fissures. Large area of consolidation involving the right lower and right middle lobes most consistent with atelectasis or pneumonia. There is background of emphysema. Secretions noted in the bronchus intermedius, right middle and right lower lobe bronchi with high-grade bronchial occlusion. A  centrally occlusive mass or endobronchial lesion is not excluded. Clinical correlation and close follow-up recommended. Bronchoscopy may provide better evaluation. Upper Abdomen: No acute abnormality. Musculoskeletal: No acute osseous pathology. Review of the MIP images confirms the above findings. IMPRESSION: 1. No CT evidence of pulmonary embolism. 2.  Large area of consolidation involving the right middle and right lower lobes consistent with post obstructive atelectasis or pneumonia. A centrally obstructing mass/neoplasm is not excluded clinical correlation and close follow-up recommended. Bronchoscopy may provide better evaluation. 3. Secretions within the right middle and right lower lobe bronchi. 4. Moderate loculated appearing right pleural effusion with extension into the right fissures. 5. Right hilar and mediastinal adenopathy. 6. Emphysema (ICD10-J43.9). Electronically Signed   By: Anner Crete M.D.   On: 07/27/2019 20:39    Assessment/Plan:  Roberto Blair is a 54 yo M w/ a PMHx notable for GERD, anxiety, HTN, OCD, sciatica, and chronic opioid use disorder who presented with progressively worsening chest pain, cough and shortness of breath concerning for pneumonia versus post obstructive effusion.  Given severity is pleuritic pain shortness of breath admission was requested.  Assessment and Plan: CAP: Parapneumonic effusion: Pleuritis: Probable pneumonia given his presentation but I agree that consideration for an obstructive neoplasm is high and repeat imaging in 4-6 weeks should be completed. This was rapidly developing when comparing his CXR from 5/27 to today. Given his hemodynamic stability but acutely worsening effusion he will likely need pleurx catheter placed given the moderate loculated pleural effusion. However, we have requested an IR consult for thoracentesis consideration if the effusion is amenable to this. Some concern for mycobacterial process vs fungal process. I suspect  that the chest pain is most likely pleuritis due to his effusion. Troponin was 3, and EKG unremarkable. No overt MSK pathology.  Plan: -Continue CTX  -Continue Metronidazole for loculated effusion  -fu thoracentesis results --checking serum LDH, pleural LDH, serum protein, pleural protein, body fluid cell count, cytology, pleural glucose, body fluid cx -Continue home morphine (MS contin) 30mg  q12 hours -duonebs q6 hours PRN for wheezing or dyspnea -Flutter valve and incentive spirometry  -may consider bronch pending thora results  Emphysema of the lung: Noted on Chest CT, which is in conjunction with his smoking history. May need alpha-one antitrypsin testing as an outpatient but will defer to PCP.   Plan: -Continue home Lama/Laba combo Anoro ellipta -Duonebs continued -F/up with PFT's as an outpatient  Chronic tobacco use: Recommended cessation. Patient is not ready to quit. Continue outpatient guidance.   Attention and concentration deficit: Continue home Adderall.  Chronic bilateral lower back pain with sciatica Chronic prescription opioid use disorder: Continue home morphine and gabapentin  Plan: Principal Problem:   Pneumonia Active Problems:   Chronic bilateral low back pain with sciatica   Tobacco use   Attention and concentration deficit   Chronic prescription opiate use   Emphysema of lung (HCC)   Parapneumonic effusion  Dispo: Anticipated discharge in approximately 1 day.   Al Decant, MD 07/28/2019, 10:20 AM Pager: [phone]

## 2019-07-28 NOTE — Progress Notes (Signed)
Patient was given sputum specimen container to collect sample. Patient unable at this time. RT instructed patient on how to collect sample and once able to called RT or RN and we would send it to lab. Family at bedside. Sputum container left on bedside table in reach of patient.

## 2019-07-29 ENCOUNTER — Telehealth: Payer: Self-pay | Admitting: *Deleted

## 2019-07-29 DIAGNOSIS — J189 Pneumonia, unspecified organism: Secondary | ICD-10-CM

## 2019-07-29 DIAGNOSIS — J869 Pyothorax without fistula: Secondary | ICD-10-CM

## 2019-07-29 LAB — ABO/RH: ABO/RH(D): O POS

## 2019-07-29 LAB — COMPREHENSIVE METABOLIC PANEL
ALT: 27 U/L (ref 0–44)
AST: 15 U/L (ref 15–41)
Albumin: 2.4 g/dL — ABNORMAL LOW (ref 3.5–5.0)
Alkaline Phosphatase: 108 U/L (ref 38–126)
Anion gap: 10 (ref 5–15)
BUN: 10 mg/dL (ref 6–20)
CO2: 27 mmol/L (ref 22–32)
Calcium: 8.3 mg/dL — ABNORMAL LOW (ref 8.9–10.3)
Chloride: 99 mmol/L (ref 98–111)
Creatinine, Ser: 0.83 mg/dL (ref 0.61–1.24)
GFR calc Af Amer: >60 mL/min (ref >60)
GFR calc non Af Amer: >60 mL/min (ref >60)
Glucose, Bld: 118 mg/dL — ABNORMAL HIGH (ref 70–99)
Potassium: 4.7 mmol/L (ref 3.5–5.1)
Sodium: 136 mmol/L (ref 135–145)
Total Bilirubin: 0.4 mg/dL (ref 0.3–1.2)
Total Protein: 6.6 g/dL (ref 6.5–8.1)

## 2019-07-29 LAB — BLOOD GAS, ARTERIAL
Acid-base deficit: 0 mmol/L (ref 0.0–2.0)
Bicarbonate: 23.5 mmol/L (ref 20.0–28.0)
Drawn by: 34719
FIO2: 21
O2 Saturation: 91 %
Patient temperature: 37.3
pCO2 arterial: 34.9 mmHg (ref 32.0–48.0)
pH, Arterial: 7.445 (ref 7.350–7.450)
pO2, Arterial: 62.2 mmHg — ABNORMAL LOW (ref 83.0–108.0)

## 2019-07-29 LAB — CBC WITH DIFFERENTIAL/PLATELET
Abs Immature Granulocytes: 0.07 10*3/uL (ref 0.00–0.07)
Basophils Absolute: 0.1 10*3/uL (ref 0.0–0.1)
Basophils Relative: 0 %
Eosinophils Absolute: 0.3 10*3/uL (ref 0.0–0.5)
Eosinophils Relative: 2 %
HCT: 36.6 % — ABNORMAL LOW (ref 39.0–52.0)
Hemoglobin: 12.3 g/dL — ABNORMAL LOW (ref 13.0–17.0)
Immature Granulocytes: 1 %
Lymphocytes Relative: 15 %
Lymphs Abs: 1.8 10*3/uL (ref 0.7–4.0)
MCH: 29.6 pg (ref 26.0–34.0)
MCHC: 33.6 g/dL (ref 30.0–36.0)
MCV: 88.2 fL (ref 80.0–100.0)
Monocytes Absolute: 1 10*3/uL (ref 0.1–1.0)
Monocytes Relative: 9 %
Neutro Abs: 8.4 10*3/uL — ABNORMAL HIGH (ref 1.7–7.7)
Neutrophils Relative %: 73 %
Platelets: 316 10*3/uL (ref 150–400)
RBC: 4.15 MIL/uL — ABNORMAL LOW (ref 4.22–5.81)
RDW: 13.9 % (ref 11.5–15.5)
WBC: 11.6 10*3/uL — ABNORMAL HIGH (ref 4.0–10.5)
nRBC: 0 % (ref 0.0–0.2)

## 2019-07-29 LAB — URINALYSIS, ROUTINE W REFLEX MICROSCOPIC
Bilirubin Urine: NEGATIVE
Glucose, UA: NEGATIVE mg/dL
Hgb urine dipstick: NEGATIVE
Ketones, ur: NEGATIVE mg/dL
Nitrite: NEGATIVE
Protein, ur: NEGATIVE mg/dL
Specific Gravity, Urine: 1.025 (ref 1.005–1.030)
pH: 5 (ref 5.0–8.0)

## 2019-07-29 LAB — CBC
HCT: 38.1 % — ABNORMAL LOW (ref 39.0–52.0)
Hemoglobin: 12.6 g/dL — ABNORMAL LOW (ref 13.0–17.0)
MCH: 29.4 pg (ref 26.0–34.0)
MCHC: 33.1 g/dL (ref 30.0–36.0)
MCV: 89 fL (ref 80.0–100.0)
Platelets: 303 10*3/uL (ref 150–400)
RBC: 4.28 MIL/uL (ref 4.22–5.81)
RDW: 13.8 % (ref 11.5–15.5)
WBC: 11.1 10*3/uL — ABNORMAL HIGH (ref 4.0–10.5)
nRBC: 0 % (ref 0.0–0.2)

## 2019-07-29 LAB — TYPE AND SCREEN
ABO/RH(D): O POS
Antibody Screen: NEGATIVE

## 2019-07-29 LAB — PROTIME-INR
INR: 1.3 — ABNORMAL HIGH (ref 0.8–1.2)
Prothrombin Time: 15.8 seconds — ABNORMAL HIGH (ref 11.4–15.2)

## 2019-07-29 LAB — APTT: aPTT: 43 seconds — ABNORMAL HIGH (ref 24–36)

## 2019-07-29 MED ORDER — CEFAZOLIN SODIUM-DEXTROSE 2-4 GM/100ML-% IV SOLN: 2.0000 g | INTRAVENOUS | Status: DC

## 2019-07-29 MED ORDER — SODIUM CHLORIDE 0.9 % IV SOLN
3.0000 g | Freq: Four times a day (QID) | INTRAVENOUS | Status: DC
  Administered 2019-07-29 – 2019-07-30 (×4): 3 g via INTRAVENOUS
  Filled 2019-07-29: qty 3
  Filled 2019-07-29 (×2): qty 8
  Filled 2019-07-29 (×2): qty 3
  Filled 2019-07-29: qty 8

## 2019-07-29 MED ORDER — SODIUM CHLORIDE 0.9 % IV SOLN: 1.0000 g | INTRAVENOUS | Status: DC

## 2019-07-29 MED ORDER — IPRATROPIUM-ALBUTEROL 0.5-2.5 (3) MG/3ML IN SOLN: 3.0000 mL | Freq: Four times a day (QID) | RESPIRATORY_TRACT | Status: DC | PRN

## 2019-07-29 MED ORDER — DICLOFENAC SODIUM 1 % EX GEL
2.0000 g | Freq: Four times a day (QID) | CUTANEOUS | Status: DC | PRN
  Filled 2019-07-29: qty 100

## 2019-07-29 MED ORDER — HYDROXYZINE HCL 25 MG PO TABS
25.0000 mg | ORAL_TABLET | Freq: Three times a day (TID) | ORAL | Status: DC | PRN
  Administered 2019-07-29: 25 mg via ORAL
  Filled 2019-07-29: qty 1

## 2019-07-29 MED ORDER — AMPHETAMINE-DEXTROAMPHETAMINE 20 MG PO TABS: 20.0000 mg | ORAL_TABLET | Freq: Two times a day (BID) | ORAL | Status: DC

## 2019-07-29 MED ORDER — UMECLIDINIUM-VILANTEROL 62.5-25 MCG/INH IN AEPB
1.0000 | INHALATION_SPRAY | Freq: Every day | RESPIRATORY_TRACT | Status: DC | PRN
  Filled 2019-07-29: qty 14

## 2019-07-29 MED ORDER — CARISOPRODOL 350 MG PO TABS
350.0000 mg | ORAL_TABLET | Freq: Three times a day (TID) | ORAL | Status: DC | PRN
  Administered 2019-07-29: 350 mg via ORAL
  Filled 2019-07-29: qty 1

## 2019-07-29 MED ORDER — HYDROXYZINE HCL 25 MG PO TABS: 25.0000 mg | ORAL_TABLET | Freq: Three times a day (TID) | ORAL | Status: DC | PRN

## 2019-07-29 MED ORDER — ENOXAPARIN SODIUM 40 MG/0.4ML ~~LOC~~ SOLN
40.0000 mg | SUBCUTANEOUS | Status: DC
  Administered 2019-07-29: 40 mg via SUBCUTANEOUS
  Filled 2019-07-29: qty 0.4

## 2019-07-29 MED ORDER — ACETAMINOPHEN 325 MG PO TABS: 650.0000 mg | ORAL_TABLET | Freq: Four times a day (QID) | ORAL | Status: DC | PRN

## 2019-07-29 MED ORDER — METRONIDAZOLE IN NACL 5-0.79 MG/ML-% IV SOLN
500.0000 mg | Freq: Three times a day (TID) | INTRAVENOUS | Status: DC
  Administered 2019-07-29 – 2019-07-30 (×2): 500 mg via INTRAVENOUS
  Filled 2019-07-29 (×2): qty 100

## 2019-07-29 NOTE — Telephone Encounter (Signed)
I am currently on night float. Dr.Lanier is listed as first contact and questions should be sent to her. I contacted Dr.Lanier and she has updated the wife.

## 2019-07-29 NOTE — Telephone Encounter (Signed)
Spouse calls and is not happy, she is very upset and states she has left messages for a doctor to call and heard from no one. She feels pt is not getting the care he needs. She states he needs to see a pulmonologist and one has not been called. She states he is not getting breathing treatments and should.  Spoke to dr Sandre Kitty he suggest that the team make contact with spouse by 2pm Sending to dr Barbaraann Faster Nurse paged [phone] pager and pcp. No response

## 2019-07-29 NOTE — Progress Notes (Signed)
Subjective: NAE overnight. Patient states that he continues to have chest wall pain on the right side. He is concerned that he is not feeling much better since he got here. He is also concerned we aren't doing enough to help. We discussed in depth our findings and the investigation we are doing behind the scenes. We discussed what we think is going on and our next steps. We discussed that we would try an extra medication for his pain.  Consults: IR  Objective:  Vital signs in last 24 hours: Vitals:   07/28/19 2049 07/28/19 2128 07/28/19 2259 07/29/19 0727  BP: 139/80 (!) 171/63 118/60 (!) 105/58  Pulse: 80 66 72 74  Resp:   18 16  Temp:   98.2 F (36.8 C) 98.2 F (36.8 C)  TempSrc:   Oral   SpO2: 91% 99% 91% 95%  Height:       General: non-toxic appearing Cardiac: RRR Pulm: breathing comfortably on room air; decreased breath sounds RLL Skin: erythematous dry skin on bilateral upper back Psych: anxious and frustrated  I/Os:  Intake/Output Summary (Last 24 hours) at 07/29/2019 1125 Last data filed at 07/29/2019 0559 Gross per 24 hour  Intake 240 ml  Output --  Net 240 ml   Telemetry:  Labs: Results for orders placed or performed during the hospital encounter of 07/27/19 (from the past 24 hour(s))  Lactate dehydrogenase (pleural or peritoneal fluid)     Status: Abnormal   Collection Time: 07/28/19  1:18 PM  Result Value Ref Range   LD, Fluid 335 (H) 3 - 23 U/L   Fluid Type-FLDH CYTO PLEU RIGHT   Body fluid cell count with differential     Status: Abnormal   Collection Time: 07/28/19  1:18 PM  Result Value Ref Range   Fluid Type-FCT CYTO PLEU RIGHT    Color, Fluid YELLOW    Appearance, Fluid CLOUDY (A) CLEAR   Total Nucleated Cell Count, Fluid 105,000 (H) 0 - 1,000 cu mm   Neutrophil Count, Fluid 69 (H) 0 - 25 %   Lymphs, Fluid 7 %   Monocyte-Macrophage-Serous Fluid 23 (L) 50 - 90 %   Eos, Fluid 1 %   Other Cells, Fluid MESOTHELIAL CELLS PRESENT %  Albumin, pleural  or peritoneal fluid     Status: None   Collection Time: 07/28/19  1:18 PM  Result Value Ref Range   Albumin, Fluid 2.1 g/dL   Fluid Type-FALB CYTO PLEU RIGHT   Protein, pleural or peritoneal fluid     Status: None   Collection Time: 07/28/19  1:18 PM  Result Value Ref Range   Total protein, fluid 4.7 g/dL   Fluid Type-FTP CYTO PLEU RIGHT   Glucose, pleural or peritoneal fluid     Status: None   Collection Time: 07/28/19  1:18 PM  Result Value Ref Range   Glucose, Fluid 106 mg/dL   Fluid Type-FGLU CYTO PLEU RIGHT   Gram stain     Status: None   Collection Time: 07/28/19  1:18 PM   Specimen: Pleura  Result Value Ref Range   Specimen Description PLEURAL FLUID    Special Requests NONE    Gram Stain      CYTOSPIN SMEAR ABUNDANT WBC PRESENT, PREDOMINANTLY PMN NO ORGANISMS SEEN Performed at [facility], [address] [place], [place] [address]    Report Status 07/28/2019 FINAL   Culture, body fluid-bottle     Status: None (Preliminary result)   Collection Time: 07/28/19  1:18 PM   Specimen: Pleura  Result Value Ref Range   Specimen Description PLEURAL FLUID    Special Requests NONE    Culture      NO GROWTH < 24 HOURS Performed at [facility], [address] [place], [place] [address]    Report Status PENDING   Procalcitonin - Baseline     Status: None   Collection Time: 07/28/19  4:47 PM  Result Value Ref Range   Procalcitonin <0.10 ng/mL  Expectorated sputum assessment w rflx to resp cult     Status: None   Collection Time: 07/28/19  4:59 PM   Specimen: Expectorated Sputum  Result Value Ref Range   Specimen Description EXPECTORATED SPUTUM    Special Requests NONE    Sputum evaluation      THIS SPECIMEN IS ACCEPTABLE FOR SPUTUM CULTURE Performed at [facility], [address], [place], [place] [address]    Report Status 07/28/2019 FINAL   Culture, respiratory     Status: None (Preliminary result)   Collection Time: 07/28/19  4:59 PM  Result  Value Ref Range   Specimen Description EXPECTORATED SPUTUM    Special Requests NONE Reflexed from [id number]    Gram Stain      ABUNDANT WBC PRESENT, PREDOMINANTLY PMN MODERATE GRAM POSITIVE COCCI FEW YEAST RARE GRAM NEGATIVE RODS    Culture      CULTURE REINCUBATED FOR BETTER GROWTH Performed at [facility], [address], [place], [place] [address]    Report Status PENDING   CBC with Differential/Platelet     Status: Abnormal   Collection Time: 07/29/19  6:42 AM  Result Value Ref Range   WBC 11.6 (H) 4.0 - 10.5 K/uL   RBC 4.15 (L) 4.22 - 5.81 MIL/uL   Hemoglobin 12.3 (L) 13.0 - 17.0 g/dL   HCT 07.6 (L) 80.8 - 81.1 %   MCV 88.2 80.0 - 100.0 fL   MCH 29.6 26.0 - 34.0 pg   MCHC 33.6 30.0 - 36.0 g/dL   RDW 03.1 59.4 - 58.5 %   Platelets 316 150 - 400 K/uL   nRBC 0.0 0.0 - 0.2 %   Neutrophils Relative % 73 %   Neutro Abs 8.4 (H) 1.7 - 7.7 K/uL   Lymphocytes Relative 15 %   Lymphs Abs 1.8 0.7 - 4.0 K/uL   Monocytes Relative 9 %   Monocytes Absolute 1.0 0.1 - 1.0 K/uL   Eosinophils Relative 2 %   Eosinophils Absolute 0.3 0.0 - 0.5 K/uL   Basophils Relative 0 %   Basophils Absolute 0.1 0.0 - 0.1 K/uL   Immature Granulocytes 1 %   Abs Immature Granulocytes 0.07 0.00 - 0.07 K/uL    Imaging: DG Chest 1 View  Result Date: 07/28/2019 CLINICAL DATA:  Pleural effusion EXAM: CHEST  1 VIEW COMPARISON:  Jul 27, 2019 chest radiograph and chest CT FINDINGS: There is a persistent pleural effusion on the right with right base atelectasis. There is mild left base atelectasis. No new opacity evident. Heart size and pulmonary vascularity are normal. No adenopathy. No bone lesions. IMPRESSION: Persistent and stable appearing right pleural effusion. Bibasilar atelectasis, more on the right than on the left. No new opacity evident. Stable cardiac silhouette. Electronically Signed   By: Bretta Bang III M.D.   On: 07/28/2019 13:28   US THORACENTESIS ASP PLEURAL SPACE W/IMG GUIDE  Result  Date: 07/28/2019 INDICATION: Shortness of breath, right-sided pleural effusion. Request for diagnostic and  possibly therapeutic thoracentesis. EXAM: ULTRASOUND GUIDED RIGHT THORACENTESIS MEDICATIONS: 1% plain lidocaine, a 6 mL COMPLICATIONS: None immediate. PROCEDURE: An ultrasound guided thoracentesis was thoroughly discussed with the patient and questions answered. The benefits, risks, alternatives and complications were also discussed. The patient understands and wishes to proceed with the procedure. Written consent was obtained. Ultrasound of the right chest demonstrates a small loculated effusion. Only approachable pocket for safe thoracentesis was more superiorly near the scapula. Site was chosen for thoracentesis. Ultrasound was performed to localize and Burnard an adequate pocket of fluid in the right chest. The area was then prepped and draped in the normal sterile fashion. 1% Lidocaine was used for local anesthesia. Under ultrasound guidance a 6 Fr Safe-T-Centesis catheter was introduced. Thoracentesis was performed. The catheter was removed and a dressing applied. FINDINGS: A total of approximately only 150 mL of hazy, amber colored fluid was removed. Samples were sent to the laboratory as requested by the clinical team. IMPRESSION: Successful ultrasound guided right thoracentesis yielding 150 mL of pleural fluid. Read by: Ascencion Dike PA-C Electronically Signed   By: Markus Daft M.D.   On: 07/28/2019 13:18   Assessment/Plan:  Roberto Blair is a 54 yo M w/ a PMHx notable for GERD, anxiety, HTN, OCD, sciatica, and chronic opioid use disorder who presented with progressively worsening chest pain, cough and shortness of breath concerning for pneumonia versus post obstructive effusion.  Given severity in pleuritic pain and shortness of breath admission was requested.  Assessment and Plan: CAP: Parapneumonic effusion: Pleuritis: Possible pneumonia vs obstructive neoplasm. This was rapidly developing  when comparing his CXR from 5/27 to admission. Thoracentesis performed yesterday. Resulted labs consistent with exudative process. Only 150 ml drained. Procal normal making malignancy higher on the differential. Chest pain is consistent with pleuritis due to his effusion. Troponin was 3, and EKG unremarkable. No overt MSK pathology. -pt continues to have significant pain which interferes with his breathing; he is concerned about what's going on; he is very anxious; we discussed his work up thus far and our plan in depth -consulted pulm for recs on management of pleural effusion and for possible bronch -spoke on the phone with wife who appreciates the update  Plan: -Continue CTX  -Continue Metronidazole for loculated effusion  -fu thoracentesis pending labs results -Continue home morphine (MS contin) 30mg  q12 hours; adding voltaren gel for pain -duonebs q6 hours PRN for wheezing or dyspnea -Flutter valve and incentive spirometry  -atarax prn for anxiety  Emphysema of the lung: Noted on Chest CT, which is in conjunction with his smoking history. May need alpha-one antitrypsin testing as an outpatient but will defer to PCP.   Plan: -Continue home Lama/Laba combo Anoro ellipta -Duonebs continued -F/up with PFT's as an outpatient  Chronic tobacco use: Recommended cessation. Patient is not ready to quit. Continue outpatient guidance.   Attention and concentration deficit: Continue home Adderall.  Chronic bilateral lower back pain with sciatica Chronic prescription opioid use disorder: Continue home morphine and gabapentin  Plan: Principal Problem:   Pneumonia Active Problems:   Chronic bilateral low back pain with sciatica   Tobacco use   Attention and concentration deficit   Chronic prescription opiate use   Emphysema of lung (HCC)   Pleural effusion on right  Dispo: Anticipated discharge in approximately 1 day.   Al Decant, MD 07/29/2019, 11:25 AM Pager: [phone]

## 2019-07-29 NOTE — Anesthesia Preprocedure Evaluation (Addendum)
Anesthesia Evaluation  Patient identified by MRN, date of birth, ID band Patient awake    Reviewed: Allergy & Precautions, NPO status , Patient's Chart, lab work & pertinent test results  Airway Mallampati: I  TM Distance: >3 FB Neck ROM: Full    Dental  (+) Poor Dentition, Missing   Pulmonary COPD,  COPD inhaler, Current Smoker and Patient abstained from smoking.,    Pulmonary exam normal breath sounds clear to auscultation       Cardiovascular hypertension, Pt. on medications Normal cardiovascular exam Rhythm:Regular Rate:Normal  ECG: NSR, rate 92   Neuro/Psych PSYCHIATRIC DISORDERS Anxiety OCD (obsessive compulsive disorder)   GI/Hepatic Neg liver ROS, GERD  Medicated and Controlled,  Endo/Other  negative endocrine ROS  Renal/GU negative Renal ROS     Musculoskeletal Chronic back pain Sciatica   Abdominal   Peds  Hematology  (+) anemia ,   Anesthesia Other Findings RIGHT LOCULATED EFFUSION  Reproductive/Obstetrics                           Anesthesia Physical Anesthesia Plan  ASA: III  Anesthesia Plan: General   Post-op Pain Management:    Induction: Intravenous  PONV Risk Score and Plan: 1 and Ondansetron, Dexamethasone, Midazolam and Treatment may vary due to age or medical condition  Airway Management Planned: Double Lumen EBT  Additional Equipment: Arterial line  Intra-op Plan:   Post-operative Plan: Extubation in OR  Informed Consent: I have reviewed the patients History and Physical, chart, labs and discussed the procedure including the risks, benefits and alternatives for the proposed anesthesia with the patient or authorized representative who has indicated his/her understanding and acceptance.     Dental advisory given  Plan Discussed with: CRNA  Anesthesia Plan Comments: (Potential central line placement discussed  Precedex)     Anesthesia Quick  Evaluation

## 2019-07-29 NOTE — Consult Note (Signed)
[address]       [place], [address]             [phone]        Roberto Blair [facility] Medical Record #[id number] Date of Birth: 1965/07/08  Referring: No ref. provider found Primary Care: Madalyn Rob, MD Primary Cardiologist:No primary care provider on file.  Chief Complaint:    Chief Complaint  Patient presents with  . Cough  . Shortness of Breath  . Chest Pain    History of Present Illness:      54 you man was in [facility] until approx one month ago when he began to experience cough followed by increased SOB/DOE. He notes that he has seasonal allergies but no h/o asthma or similar sx as these. He continues to smoke 1ppd. When sx persisted, he sought attention at urgent care, where cxr was done and was nonspecific. He presented to ED this pas weekend with worsening sx and difficulty expanding his lungs to catch his breath. CT chest showed loculated right pleural effusion, matching his perception of sx. Denies f/c, but he states pain is now involving upper abdomen.   Current Activity/ Functional Status: Patient will be independent with mobility/ambulation, transfers, ADL's, IADL's.   Zubrod Score: At the time of surgery this patient's most appropriate activity status/level should be described as: []     0    Normal activity, no symptoms []     1    Restricted in physical strenuous activity but ambulatory, able to do out light work []     2    Ambulatory and capable of self care, unable to do work activities, up and about                 more than 50%  Of the time                            []     3    Only limited self care, in bed greater than 50% of waking hours []     4    Completely disabled, no self care, confined to bed or chair []     5    Moribund  Past Medical History:  Diagnosis Date  . Anxiety   . Chronic back pain   . GERD (gastroesophageal reflux disease)   . Hypertension    prior, was on medication for a few months in the past  .  Lumbar disc herniation   . OCD (obsessive compulsive disorder)   . Sciatica     Past Surgical History:  Procedure Laterality Date  . APPENDECTOMY    . LUMBAR EPIDURAL INJECTION    . TMJ ARTHROPLASTY    . TRIGGER POINT INJECTION      Social History   Tobacco Use  Smoking Status Current Every Day Smoker  . Packs/day: 1.00  . Years: 40.00  . Pack years: 40.00  . Types: Cigarettes  Smokeless Tobacco Never Used  Tobacco Comment   1 PPD    Social History   Substance and Sexual Activity  Alcohol Use No     Allergies  Allergen Reactions  . Aspirin Other (See Comments)    Causes ulcers  . Nsaids Other (See Comments)    Causes ulcers    Current Facility-Administered Medications  Medication Dose Route Frequency Provider Last Rate Last Admin  . acetaminophen (TYLENOL) tablet 650 mg  650 mg Oral  Q6H PRN Seawell, Jaimie A, DO      . amphetamine-dextroamphetamine (ADDERALL) tablet 20 mg  20 mg Oral BID Seawell, Jaimie A, DO      . Ampicillin-Sulbactam (UNASYN) 3 g in sodium chloride 0.9 % 100 mL IVPB  3 g Intravenous Q6H Anne Shutter, MD 200 mL/hr at 07/29/19 1542 3 g at 07/29/19 1542  . carisoprodol (SOMA) tablet 350 mg  350 mg Oral TID PRN Seawell, Jaimie A, DO   350 mg at 07/29/19 1719  . ceFAZolin (ANCEF) IVPB 2g/100 mL premix  2 g Intravenous 30 min Pre-Op Chesnee Floren Z, MD      . cloNIDine (CATAPRES) tablet 0.2 mg  0.2 mg Oral QHS Albertha Ghee, MD   0.2 mg at 07/28/19 2045  . diclofenac Sodium (VOLTAREN) 1 % topical gel 2 g  2 g Topical QID PRN Seawell, Jaimie A, DO      . enoxaparin (LOVENOX) injection 40 mg  40 mg Subcutaneous Q24H Seawell, Jaimie A, DO      . gabapentin (NEURONTIN) capsule 800 mg  800 mg Oral QID Albertha Ghee, MD   800 mg at 07/29/19 1719  . hydrOXYzine (ATARAX/VISTARIL) tablet 25 mg  25 mg Oral TID PRN Seawell, Jaimie A, DO      . ipratropium-albuterol (DUONEB) 0.5-2.5 (3) MG/3ML nebulizer solution 3 mL  3 mL Nebulization Q6H PRN Seawell,  Jaimie A, DO      . metroNIDAZOLE (FLAGYL) IVPB 500 mg  500 mg Intravenous Q8H Seawell, Jaimie A, DO 100 mL/hr at 07/29/19 1620 500 mg at 07/29/19 1620  . morphine (MS CONTIN) 12 hr tablet 30 mg  30 mg Oral TID PRN Albertha Ghee, MD   30 mg at 07/29/19 1723  . pantoprazole (PROTONIX) EC tablet 20 mg  20 mg Oral Daily Albertha Ghee, MD   20 mg at 07/29/19 0935  . QUEtiapine (SEROQUEL) tablet 200 mg  200 mg Oral QHS Albertha Ghee, MD   200 mg at 07/28/19 2044  . umeclidinium-vilanterol (ANORO ELLIPTA) 62.5-25 MCG/INH 1 puff  1 puff Inhalation Daily PRN Seawell, Jaimie A, DO        Medications Prior to Admission  Medication Sig Dispense Refill Last Dose  . albuterol (VENTOLIN HFA) 108 (90 Base) MCG/ACT inhaler Inhale 2 puffs into the lungs every 6 (six) hours as needed for wheezing or shortness of breath.   07/27/2019 at am  . amphetamine-dextroamphetamine (ADDERALL) 20 MG tablet Take 1 tablet (20 mg total) by mouth 2 (two) times daily. (Patient taking differently: Take 20 mg by mouth See admin instructions. Take one tablet (20 mg) by mouth twice daily - early morning and at lunch (may also take 2 tablets (40 mg) every morning instead of twice daily)) 60 tablet 0 07/27/2019 at am  . carisoprodol (SOMA) 350 MG tablet Take 1 tablet (350 mg total) by mouth 3 (three) times daily as needed for muscle spasms. (Patient taking differently: Take 350 mg by mouth in the morning, at noon, and at bedtime. ) 90 tablet 0 07/27/2019 at noon  . chlorpheniramine-HYDROcodone (TUSSIONEX PENNKINETIC ER) 10-8 MG/5ML SUER Take 5 mLs by mouth every 12 (twelve) hours as needed for cough. 70 mL 0 07/23/2019  . cloNIDine (CATAPRES) 0.1 MG tablet Take 0.2 mg by mouth at bedtime.   07/26/2019 at pm  . gabapentin (NEURONTIN) 800 MG tablet Take 1 tablet (800 mg total) by mouth 4 (four) times daily. (Patient taking differently: Take 800 mg by mouth in the morning, at  noon, and at bedtime. ) 120 tablet 0 07/27/2019 at noon  . Morphine Sulfate  ER 30 MG T12A Take 30 mg by mouth 3 (three) times daily as needed. (Patient taking differently: Take 30 mg by mouth in the morning, at noon, and at bedtime. ) 90 tablet 0 07/27/2019 at noon  . pantoprazole (PROTONIX) 20 MG tablet Take 1 tablet (20 mg total) by mouth daily. 30 tablet 0 07/27/2019 at am  . predniSONE (STERAPRED UNI-PAK 21 TAB) 10 MG (21) TBPK tablet Take by mouth daily. Take as directed. (Patient taking differently: Take 10-60 mg by mouth See admin instructions. Tapered course started 07/24/2019: Take 6 tablets (60 mg) by mouth on day 1, then take 5 tablets (50 mg)  on day 2, then take 4 tablets (40 mg)  on day 3, then take 3 tablets (30 mg) on day 4, then take 2 tablets (20 mg) on day 5, then take 1 tablet (10 mg) on day 6, then stop.) 21 tablet 0 07/25/2019  . QUEtiapine (SEROQUEL) 100 MG tablet Take 2 tablets (200 mg total) by mouth at bedtime. 60 tablet 0 07/26/2019 at pm  . umeclidinium-vilanterol (ANORO ELLIPTA) 62.5-25 MCG/INH AEPB Inhale 1 puff into the lungs daily as needed (shortness of breath).   07/27/2019 at am  . benzonatate (TESSALON PERLES) 100 MG capsule Take 1 capsule (100 mg total) by mouth every 6 (six) hours as needed for cough. (Patient not taking: Reported on 07/27/2019) 30 capsule 0 Not Taking at Unknown time  . buPROPion (WELLBUTRIN XL) 150 MG 24 hr tablet Take 1 tablet (150 mg total) by mouth daily. 30 tablet 1 not yet taken  . cloNIDine (CATAPRES) 0.2 MG tablet Take 1 tablet (0.2 mg total) by mouth at bedtime. (Patient not taking: Reported on 07/27/2019) 30 tablet 1 Not Taking at Unknown time    Family History  Family history unknown: Yes     Review of Systems:   ROS A comprehensive review of systems was negative.     Cardiac Review of Systems: Y or  [    ]= no  Chest Pain [    ]  Resting SOB [   ] Exertional SOB  [  ]  Orthopnea [  ]   Pedal Edema [   ]    Palpitations [  ] Syncope  [  ]   Presyncope [   ]  General Review of Systems: [Y] = yes [   ]=no Constitional: recent weight change [  ]; anorexia [  ]; fatigue [  ]; nausea [  ]; night sweats [  ]; fever [  ]; or chills [  ]                                                               Dental: Last Dentist visit:   Eye : blurred vision [  ]; diplopia [   ]; vision changes [  ];  Amaurosis fugax[  ]; Resp: cough [  ];  wheezing[  ];  hemoptysis[  ]; shortness of breath[  ]; paroxysmal nocturnal dyspnea[  ]; dyspnea on exertion[  ]; or orthopnea[  ];  GI:  gallstones[  ], vomiting[  ];  dysphagia[  ]; melena[  ];  hematochezia [  ];  heartburn[  ];   Hx of  Colonoscopy[  ]; GU: kidney stones [  ]; hematuria[  ];   dysuria [  ];  nocturia[  ];  history of     obstruction [  ]; urinary frequency [  ]             Skin: rash, swelling[  ];, hair loss[  ];  peripheral edema[  ];  or itching[  ]; Musculosketetal: myalgias[  ];  joint swelling[  ];  joint erythema[  ];  joint pain[  ];  back pain[  ];  Heme/Lymph: bruising[  ];  bleeding[  ];  anemia[  ];  Neuro: TIA[  ];  headaches[  ];  stroke[  ];  vertigo[  ];  seizures[  ];   paresthesias[  ];  difficulty walking[  ];  Psych:depression[  ]; anxiety[  ];  Endocrine: diabetes[  ];  thyroid dysfunction[  ];          Physical Exam: BP 129/63 (BP Location: Left Arm)   Pulse 71   Temp 99.4 F (37.4 C)   Resp 16   Ht 6' 0.1" (1.831 m)   SpO2 93%   BMI 28.81 kg/m    General appearance: alert and cooperative Head: Normocephalic, without obvious abnormality, atraumatic Neck: no adenopathy, no carotid bruit, no JVD, supple, symmetrical, trachea midline and thyroid not enlarged, symmetric, no tenderness/mass/nodules Resp: diminished breath sounds RLL Cardio: regular rate and rhythm, S1, S2 normal, no murmur, click, rub or gallop GI: soft, non-tender; bowel sounds normal; no masses,  no organomegaly Extremities: extremities normal, atraumatic, no cyanosis or edema Neurologic: Alert and oriented X 3, normal strength and tone. Normal  symmetric reflexes. Normal coordination and gait  Diagnostic Studies & Laboratory data:     Recent Radiology Findings:   DG Chest 1 View  Result Date: 07/28/2019 CLINICAL DATA:  Pleural effusion EXAM: CHEST  1 VIEW COMPARISON:  Jul 27, 2019 chest radiograph and chest CT FINDINGS: There is a persistent pleural effusion on the right with right base atelectasis. There is mild left base atelectasis. No new opacity evident. Heart size and pulmonary vascularity are normal. No adenopathy. No bone lesions. IMPRESSION: Persistent and stable appearing right pleural effusion. Bibasilar atelectasis, more on the right than on the left. No new opacity evident. Stable cardiac silhouette. Electronically Signed   By: Bretta Bang III M.D.   On: 07/28/2019 13:28   CT Angio Chest PE W and/or Wo Contrast  Result Date: 07/27/2019 CLINICAL DATA:  54 year old male with shortness of breath. EXAM: CT ANGIOGRAPHY CHEST WITH CONTRAST TECHNIQUE: Multidetector CT imaging of the chest was performed using the standard protocol during bolus administration of intravenous contrast. Multiplanar CT image reconstructions and MIPs were obtained to evaluate the vascular anatomy. CONTRAST:  80mL OMNIPAQUE IOHEXOL 350 MG/ML SOLN COMPARISON:  Chest radiograph dated 07/27/2019. FINDINGS: Cardiovascular: Top-normal cardiac size. No pericardial effusion. The thoracic aorta is unremarkable. The origins of the great vessels of the aortic arch appear patent. No pulmonary artery embolus identified. Mediastinum/Nodes: Right hilar adenopathy measures 2 cm. Subcarinal lymph nodes measure 15 mm in short axis. Right paratracheal lymph node measures 13 mm in short axis. The esophagus and the thyroid gland are grossly unremarkable. No mediastinal fluid collection. Multiple mildly enlarged and rounded lymph nodes noted along the right cardiophrenic angle and adjacent to the GE junction. These may be reactive although metastatic disease is not excluded.  Lungs/Pleura: Moderate loculated appearing right pleural effusion with  extension into the right fissures. Large area of consolidation involving the right lower and right middle lobes most consistent with atelectasis or pneumonia. There is background of emphysema. Secretions noted in the bronchus intermedius, right middle and right lower lobe bronchi with high-grade bronchial occlusion. A centrally occlusive mass or endobronchial lesion is not excluded. Clinical correlation and close follow-up recommended. Bronchoscopy may provide better evaluation. Upper Abdomen: No acute abnormality. Musculoskeletal: No acute osseous pathology. Review of the MIP images confirms the above findings. IMPRESSION: 1. No CT evidence of pulmonary embolism. 2. Large area of consolidation involving the right middle and right lower lobes consistent with post obstructive atelectasis or pneumonia. A centrally obstructing mass/neoplasm is not excluded clinical correlation and close follow-up recommended. Bronchoscopy may provide better evaluation. 3. Secretions within the right middle and right lower lobe bronchi. 4. Moderate loculated appearing right pleural effusion with extension into the right fissures. 5. Right hilar and mediastinal adenopathy. 6. Emphysema (ICD10-J43.9). Electronically Signed   By: Elgie Collard M.D.   On: 07/27/2019 20:39   US THORACENTESIS ASP PLEURAL SPACE W/IMG GUIDE  Result Date: 07/28/2019 INDICATION: Shortness of breath, right-sided pleural effusion. Request for diagnostic and possibly therapeutic thoracentesis. EXAM: ULTRASOUND GUIDED RIGHT THORACENTESIS MEDICATIONS: 1% plain lidocaine, a 6 mL COMPLICATIONS: None immediate. PROCEDURE: An ultrasound guided thoracentesis was thoroughly discussed with the patient and questions answered. The benefits, risks, alternatives and complications were also discussed. The patient understands and wishes to proceed with the procedure. Written consent was obtained.  Ultrasound of the right chest demonstrates a small loculated effusion. Only approachable pocket for safe thoracentesis was more superiorly near the scapula. Site was chosen for thoracentesis. Ultrasound was performed to localize and Luisfernando an adequate pocket of fluid in the right chest. The area was then prepped and draped in the normal sterile fashion. 1% Lidocaine was used for local anesthesia. Under ultrasound guidance a 6 Fr Safe-T-Centesis catheter was introduced. Thoracentesis was performed. The catheter was removed and a dressing applied. FINDINGS: A total of approximately only 150 mL of hazy, amber colored fluid was removed. Samples were sent to the laboratory as requested by the clinical team. IMPRESSION: Successful ultrasound guided right thoracentesis yielding 150 mL of pleural fluid. Read by: Brayton El PA-C Electronically Signed   By: Richarda Overlie M.D.   On: 07/28/2019 13:18     I have independently reviewed the above radiologic studies and discussed with the patient   Recent Lab Findings: Lab Results  Component Value Date   WBC 11.6 (H) 07/29/2019   HGB 12.3 (L) 07/29/2019   HCT 36.6 (L) 07/29/2019   PLT 316 07/29/2019   GLUCOSE 105 (H) 07/28/2019   ALT 29 07/28/2019   AST 20 07/28/2019   NA 134 (L) 07/28/2019   K 3.9 07/28/2019   CL 98 07/28/2019   CREATININE 0.74 07/28/2019   BUN 8 07/28/2019   CO2 27 07/28/2019      Assessment / Plan:    54 yo man with several weeks h/o worsening respiratory difficulty and CT evidence for empyema with right lung entrapment. The etiology is unclear.   Suggest right thoracoscopic chest exploration, pulmonary decortication, and mechanical pleurodesis along with fiberoptic bronchoscopy. This will be performed as 1st case on 07/30/19. He is in agreement and wishes to proceed.    I  spent 40 minutes counseling the patient face to face.  Larence Thone Z. Vickey Sages, MD [phone] 07/29/2019 7:33 PM

## 2019-07-29 NOTE — Consult Note (Signed)
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  He denies purulent sputum.  He was seen in the urgent care 07/24/2019 chest x-ray showed right effusion he was treated for pleurisy with prednisone.  Discharged home he returns to [facility] 07/27/2019 with worsening chest pains inability to take a deep breath and feelings of dyspnea. 07/27/2019 CT of the chest showed no pulmonary embolism but did demonstrate a large area of consolidation involving the right middle and right lower lobe consistent with postobstructive atelectasis and/or pneumonia.  There are secretions noted in the right middle and right lower lobe bronchi moderate loculated pleural effusion with extension into the right fissure along with right hilar and mediastinal adenopathy.  There are also changes consistent with emphysema.  He underwent right sided thoracentesis per interventional radiology with 150 cc of yellow fluid obtained which was remarkable for appearance being cloudy total nucleated cell count fluid 105,000, neutrophil count 69 and monocyte marker 5 serous fluid 23. He does have strong family history of cancer with father dying of colon cancer and mother side of family having lung cancer. 07/29/2019 pulmonary critical care asked to evaluate patient for right loculated effusion, right chest pain and increasing dyspnea.  He may need  CVTS intervention with possible VATS and/or possibility of fiberoptic bronchoscopy per the pulmonary critical care service.  Agree with antimicrobial therapy for pneumonia.  Await further findings from pleural fluid.  Pulmonary critical care MD to evaluate.  PAST MEDICAL HISTORY :   has a past medical history of Anxiety, Chronic back pain, GERD (gastroesophageal reflux disease), Hypertension, Lumbar disc herniation, OCD (obsessive compulsive disorder), and Sciatica.  has a past surgical history that includes Appendectomy; TMJ Arthroplasty; Lumbar epidural injection; and Trigger point injection. Prior to Admission medications   Medication Sig Start Date End Date Taking? Authorizing Provider  albuterol (VENTOLIN HFA) 108 (90 Base) MCG/ACT inhaler Inhale 2 puffs into the lungs every 6 (six) hours as needed for wheezing or shortness of breath.   Yes [provider]  amphetamine-dextroamphetamine (ADDERALL) 20 MG tablet Take 1 tablet (20 mg total) by mouth 2 (two) times daily. Patient taking differently: Take 20 mg by mouth See admin instructions. Take one tablet (20 mg) by mouth twice daily - early morning and at lunch (may also take 2 tablets (40 mg) every morning instead of twice daily) 07/18/19  Yes Madalyn Rob, MD  carisoprodol (SOMA) 350 MG tablet Take 1 tablet (350 mg total) by mouth 3 (three) times daily as needed for muscle spasms. Patient taking differently: Take 350 mg by mouth in the morning, at noon, and at bedtime.  07/18/19  Yes Madalyn Rob, MD  chlorpheniramine-HYDROcodone [facility]) 10-8 MG/5ML SUER Take 5 mLs by mouth every 12 (twelve) hours as needed for cough. 07/19/19  Yes Asencion Noble, MD  cloNIDine (CATAPRES) 0.1 MG tablet Take 0.2 mg by mouth at bedtime.   Yes [provider]  gabapentin (NEURONTIN) 800 MG tablet  Take 1 tablet (800 mg total) by mouth 4 (four) times daily. Patient taking differently: Take 800 mg by mouth in the morning, at noon,  and at bedtime.  07/18/19  Yes Albertha Ghee, MD  Morphine Sulfate ER 30 MG T12A Take 30 mg by mouth 3 (three) times daily as needed. Patient taking differently: Take 30 mg by mouth in the morning, at noon, and at bedtime.  07/18/19  Yes Albertha Ghee, MD  pantoprazole (PROTONIX) 20 MG tablet Take 1 tablet (20 mg total) by mouth daily. 07/19/19  Yes Claudean Severance, MD  predniSONE (STERAPRED UNI-PAK 21 TAB) 10 MG (21) TBPK tablet Take by mouth daily. Take as directed. Patient taking differently: Take 10-60 mg by mouth See admin instructions. Tapered course started 07/24/2019: Take 6 tablets (60 mg) by mouth on day 1, then take 5 tablets (50 mg)  on day 2, then take 4 tablets (40 mg)  on day 3, then take 3 tablets (30 mg) on day 4, then take 2 tablets (20 mg) on day 5, then take 1 tablet (10 mg) on day 6, then stop. 07/24/19  Yes Hagler, Arlys John, MD  QUEtiapine (SEROQUEL) 100 MG tablet Take 2 tablets (200 mg total) by mouth at bedtime. 07/18/19  Yes Albertha Ghee, MD  umeclidinium-vilanterol [facility] ELLIPTA) 62.5-25 MCG/INH AEPB Inhale 1 puff into the lungs daily as needed (shortness of breath).   Yes [provider]  benzonatate (TESSALON PERLES) 100 MG capsule Take 1 capsule (100 mg total) by mouth every 6 (six) hours as needed for cough. Patient not taking: Reported on 07/27/2019 07/16/19 08/15/19  Lorenso Courier, MD  buPROPion (WELLBUTRIN XL) 150 MG 24 hr tablet Take 1 tablet (150 mg total) by mouth daily. 07/23/19   Arfeen, Phillips Grout, MD  cloNIDine (CATAPRES) 0.2 MG tablet Take 1 tablet (0.2 mg total) by mouth at bedtime. Patient not taking: Reported on 07/27/2019 07/23/19   Arfeen, Phillips Grout, MD  clonazePAM (KLONOPIN) 1 MG tablet Take 1 mg by mouth 2 (two) times daily as needed.  03/09/19  [provider]   Allergies  Allergen Reactions  . Aspirin Other (See Comments)    Causes ulcers  . Nsaids Other (See Comments)    Causes ulcers    FAMILY HISTORY:  Father passed away with intestinal  cancer Mother saw the family deaths from lung cancer   SOCIAL HISTORY:  reports that he has been smoking cigarettes. He has a 40.00 pack-year smoking history. He has never used smokeless tobacco. He reports that he does not drink alcohol or use drugs.  REVIEW OF SYSTEMS:   10 point review of system taken, please see HPI for positives and negatives.   SUBJECTIVE:  54 year old male no acute distress VITAL SIGNS: Temp:  [98.2 F (36.8 C)] 98.2 F (36.8 C) (06/01 0727) Pulse Rate:  [66-85] 74 (06/01 0727) Resp:  [16-18] 16 (06/01 0727) BP: (105-171)/(58-80) 105/58 (06/01 0727) SpO2:  [90 %-99 %] 95 % (06/01 0727)  PHYSICAL EXAMINATION: General: Well-developed well-nourished male no acute distress complaining of right-sided chest pain Neuro: Grossly intact no focal defects HEENT: No JVD or lymphadenopathy is appreciated Cardiovascular: Heart sounds are regular regular rate and rhythm Lungs: Decreased breath sounds on the right tender to percussion on the right, dull to percussion on the right. Abdomen: Soft nontender Musculoskeletal: Grossly intact Skin: Warm and dry  Recent Labs  Lab 07/27/19 1458 07/28/19 0432  NA 135 134*  K 4.2 3.9  CL 101 98  CO2 22  27  BUN 9 8  CREATININE 0.72 0.74  GLUCOSE 143* 105*   Recent Labs  Lab 07/27/19 1458 07/28/19 0432 07/29/19 0642  HGB 13.4 12.4* 12.3*  HCT 40.2 37.7* 36.6*  WBC 16.0* 13.0* 11.6*  PLT 341 284 316   DG Chest 1 View  Result Date: 07/28/2019 CLINICAL DATA:  Pleural effusion EXAM: CHEST  1 VIEW COMPARISON:  Jul 27, 2019 chest radiograph and chest CT FINDINGS: There is a persistent pleural effusion on the right with right base atelectasis. There is mild left base atelectasis. No new opacity evident. Heart size and pulmonary vascularity are normal. No adenopathy. No bone lesions. IMPRESSION: Persistent and stable appearing right pleural effusion. Bibasilar atelectasis, more on the right than on the left. No new opacity  evident. Stable cardiac silhouette. Electronically Signed   By: Bretta BangWilliam  Woodruff III M.D.   On: 07/28/2019 13:28   DG Chest 2 View  Result Date: 07/27/2019 CLINICAL DATA:  Cough.  Phlegm EXAM: CHEST - 2 VIEW COMPARISON:  Radiograph 07/24/2019 FINDINGS: Normal cardiac silhouette. Significant increase in RIGHT pleural effusion in short interval follow-up. RIGHT basilar atelectasis noted. LEFT lung clear. Upper lobes clear. IMPRESSION: Marked increase in RIGHT pleural effusion is concerning for parapneumonic effusion. Findings suggest RIGHT lower lobe pneumonia. Followup PA and lateral chest X-ray is recommended in 3-4 weeks following trial of antibiotic therapy to ensure resolution and exclude underlying MALIGNANCY. Electronically Signed   By: Genevive BiStewart  Edmunds M.D.   On: 07/27/2019 15:17   CT Angio Chest PE W and/or Wo Contrast  Result Date: 07/27/2019 CLINICAL DATA:  54 year old male with shortness of breath. EXAM: CT ANGIOGRAPHY CHEST WITH CONTRAST TECHNIQUE: Multidetector CT imaging of the chest was performed using the standard protocol during bolus administration of intravenous contrast. Multiplanar CT image reconstructions and MIPs were obtained to evaluate the vascular anatomy. CONTRAST:  75mL OMNIPAQUE IOHEXOL 350 MG/ML SOLN COMPARISON:  Chest radiograph dated 07/27/2019. FINDINGS: Cardiovascular: Top-normal cardiac size. No pericardial effusion. The thoracic aorta is unremarkable. The origins of the great vessels of the aortic arch appear patent. No pulmonary artery embolus identified. Mediastinum/Nodes: Right hilar adenopathy measures 2 cm. Subcarinal lymph nodes measure 15 mm in short axis. Right paratracheal lymph node measures 13 mm in short axis. The esophagus and the thyroid gland are grossly unremarkable. No mediastinal fluid collection. Multiple mildly enlarged and rounded lymph nodes noted along the right cardiophrenic angle and adjacent to the GE junction. These may be reactive although  metastatic disease is not excluded. Lungs/Pleura: Moderate loculated appearing right pleural effusion with extension into the right fissures. Large area of consolidation involving the right lower and right middle lobes most consistent with atelectasis or pneumonia. There is background of emphysema. Secretions noted in the bronchus intermedius, right middle and right lower lobe bronchi with high-grade bronchial occlusion. A centrally occlusive mass or endobronchial lesion is not excluded. Clinical correlation and close follow-up recommended. Bronchoscopy may provide better evaluation. Upper Abdomen: No acute abnormality. Musculoskeletal: No acute osseous pathology. Review of the MIP images confirms the above findings. IMPRESSION: 1. No CT evidence of pulmonary embolism. 2. Large area of consolidation involving the right middle and right lower lobes consistent with post obstructive atelectasis or pneumonia. A centrally obstructing mass/neoplasm is not excluded clinical correlation and close follow-up recommended. Bronchoscopy may provide better evaluation. 3. Secretions within the right middle and right lower lobe bronchi. 4. Moderate loculated appearing right pleural effusion with extension into the right fissures. 5. Right hilar and mediastinal adenopathy. 6.  Emphysema (ICD10-J43.9). Electronically Signed   By: Elgie Collard M.D.   On: 07/27/2019 20:39   US THORACENTESIS ASP PLEURAL SPACE W/IMG GUIDE  Result Date: 07/28/2019 INDICATION: Shortness of breath, right-sided pleural effusion. Request for diagnostic and possibly therapeutic thoracentesis. EXAM: ULTRASOUND GUIDED RIGHT THORACENTESIS MEDICATIONS: 1% plain lidocaine, a 6 mL COMPLICATIONS: None immediate. PROCEDURE: An ultrasound guided thoracentesis was thoroughly discussed with the patient and questions answered. The benefits, risks, alternatives and complications were also discussed. The patient understands and wishes to proceed with the procedure.  Written consent was obtained. Ultrasound of the right chest demonstrates a small loculated effusion. Only approachable pocket for safe thoracentesis was more superiorly near the scapula. Site was chosen for thoracentesis. Ultrasound was performed to localize and Lyan an adequate pocket of fluid in the right chest. The area was then prepped and draped in the normal sterile fashion. 1% Lidocaine was used for local anesthesia. Under ultrasound guidance a 6 Fr Safe-T-Centesis catheter was introduced. Thoracentesis was performed. The catheter was removed and a dressing applied. FINDINGS: A total of approximately only 150 mL of hazy, amber colored fluid was removed. Samples were sent to the laboratory as requested by the clinical team. IMPRESSION: Successful ultrasound guided right thoracentesis yielding 150 mL of pleural fluid. Read by: Brayton El PA-C Electronically Signed   By: Richarda Overlie M.D.   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  He denies purulent sputum.  He was seen in the urgent care 07/24/2019 chest x-ray showed right effusion he was treated for pleurisy with prednisone.  Discharged home he returns to [facility] 07/27/2019 with worsening chest pains inability to take a deep breath and feelings of dyspnea. 07/27/2019 CT of the chest showed no pulmonary embolism but did demonstrate a large area of consolidation involving the right middle and right lower lobe consistent with postobstructive atelectasis and/or pneumonia.  There are secretions noted in the right middle and right lower lobe bronchi moderate loculated pleural  effusion with extension into the right fissure along with right hilar and mediastinal adenopathy.  There are also changes consistent with emphysema.  He underwent right sided thoracentesis per interventional radiology with 150 cc of yellow fluid obtained which was remarkable for appearance being cloudy total nucleated cell count fluid 105,000, neutrophil count 69 and monocyte marker 5 serous fluid 23. He does have strong family history of cancer with father dying of colon cancer and mother side of family having lung cancer. 07/29/2019 pulmonary critical care asked to evaluate patient for right loculated effusion, right chest pain and increasing dyspnea.  He may need CVTS intervention with possible VATS and/or possibility of fiberoptic bronchoscopy per the pulmonary critical care service.  Agree with antimicrobial therapy for pneumonia.  Await further findings from pleural fluid.  Pulmonary critical care MD to evaluate.      PLAN: Agree with antimicrobial therapy Continue to follow pleural fluid labs Consideration for possible fiberoptic bronchoscopy Consideration for possible cardiovascular thoracic surgery consult for VATS Pain relief which will be difficult in a chronic opioid user Tobacco cessation    Brett Canales Syrena Burges ACNP Acute  Care Nurse Practitioner [facility] Please consult Amion 07/29/2019, 11:57 AM

## 2019-07-29 NOTE — Progress Notes (Signed)
Consent forms obtained for surgery in AM and placed on chart. MRSA and urine obtained as ordered, sent for processing. Patient is aware of NPO status after midnight. No acute needs voiced at this time.

## 2019-07-30 ENCOUNTER — Inpatient Hospital Stay (HOSPITAL_COMMUNITY): Payer: 59

## 2019-07-30 ENCOUNTER — Encounter (HOSPITAL_COMMUNITY)
Admission: EM | Disposition: A | Discharge status: Home / Self Care | Payer: Self-pay | Source: Home / Self Care | Attending: Internal Medicine

## 2019-07-30 HISTORY — PX: VIDEO BRONCHOSCOPY: SHX5072

## 2019-07-30 HISTORY — PX: VIDEO ASSISTED THORACOSCOPY (VATS)/DECORTICATION: SHX6171

## 2019-07-30 HISTORY — PX: EMPYEMA DRAINAGE: SHX5097

## 2019-07-30 LAB — CBC WITH DIFFERENTIAL/PLATELET
Abs Immature Granulocytes: 0.09 10*3/uL — ABNORMAL HIGH (ref 0.00–0.07)
Basophils Absolute: 0.1 10*3/uL (ref 0.0–0.1)
Basophils Relative: 1 %
Eosinophils Absolute: 0.3 10*3/uL (ref 0.0–0.5)
Eosinophils Relative: 3 %
HCT: 35.3 % — ABNORMAL LOW (ref 39.0–52.0)
Hemoglobin: 11.7 g/dL — ABNORMAL LOW (ref 13.0–17.0)
Immature Granulocytes: 1 %
Lymphocytes Relative: 19 %
Lymphs Abs: 2.1 10*3/uL (ref 0.7–4.0)
MCH: 29.4 pg (ref 26.0–34.0)
MCHC: 33.1 g/dL (ref 30.0–36.0)
MCV: 88.7 fL (ref 80.0–100.0)
Monocytes Absolute: 1 10*3/uL (ref 0.1–1.0)
Monocytes Relative: 9 %
Neutro Abs: 7.4 10*3/uL (ref 1.7–7.7)
Neutrophils Relative %: 67 %
Platelets: 292 10*3/uL (ref 150–400)
RBC: 3.98 MIL/uL — ABNORMAL LOW (ref 4.22–5.81)
RDW: 13.8 % (ref 11.5–15.5)
WBC: 10.9 10*3/uL — ABNORMAL HIGH (ref 4.0–10.5)
nRBC: 0 % (ref 0.0–0.2)

## 2019-07-30 LAB — CULTURE, RESPIRATORY W GRAM STAIN: Culture: NORMAL

## 2019-07-30 LAB — CYTOLOGY - NON PAP

## 2019-07-30 LAB — ACID FAST SMEAR (AFB, MYCOBACTERIA): Acid Fast Smear: NEGATIVE

## 2019-07-30 LAB — MRSA PCR SCREENING: MRSA by PCR: NEGATIVE

## 2019-07-30 SURGERY — BRONCHOSCOPY, VIDEO-ASSISTED
Anesthesia: General | Site: Chest | Laterality: Right

## 2019-07-30 MED ORDER — BUPIVACAINE HCL (PF) 0.5 % IJ SOLN
INTRAMUSCULAR | Status: AC
  Filled 2019-07-30: qty 30

## 2019-07-30 MED ORDER — GLYCOPYRROLATE PF 0.2 MG/ML IJ SOSY
PREFILLED_SYRINGE | INTRAMUSCULAR | Status: DC | PRN
  Administered 2019-07-30: .1 mg via INTRAVENOUS

## 2019-07-30 MED ORDER — ALBUTEROL SULFATE HFA 108 (90 BASE) MCG/ACT IN AERS
INHALATION_SPRAY | RESPIRATORY_TRACT | Status: AC
  Filled 2019-07-30: qty 6.7

## 2019-07-30 MED ORDER — MIDAZOLAM HCL 2 MG/2ML IJ SOLN
INTRAMUSCULAR | Status: DC | PRN
  Administered 2019-07-30 (×2): 1 mg via INTRAVENOUS

## 2019-07-30 MED ORDER — HYDROMORPHONE HCL 1 MG/ML IJ SOLN
INTRAMUSCULAR | Status: AC
  Filled 2019-07-30: qty 1

## 2019-07-30 MED ORDER — FENTANYL CITRATE (PF) 250 MCG/5ML IJ SOLN
INTRAMUSCULAR | Status: AC
  Filled 2019-07-30: qty 5

## 2019-07-30 MED ORDER — LIDOCAINE 2% (20 MG/ML) 5 ML SYRINGE
INTRAMUSCULAR | Status: AC
  Filled 2019-07-30: qty 5

## 2019-07-30 MED ORDER — PROPOFOL 10 MG/ML IV BOLUS
INTRAVENOUS | Status: AC
  Filled 2019-07-30: qty 20

## 2019-07-30 MED ORDER — ACETAMINOPHEN 10 MG/ML IV SOLN
INTRAVENOUS | Status: AC
  Filled 2019-07-30: qty 100

## 2019-07-30 MED ORDER — ALBUTEROL SULFATE HFA 108 (90 BASE) MCG/ACT IN AERS
INHALATION_SPRAY | RESPIRATORY_TRACT | Status: DC | PRN
  Administered 2019-07-30: 8 via RESPIRATORY_TRACT

## 2019-07-30 MED ORDER — ACETAMINOPHEN 10 MG/ML IV SOLN
1000.0000 mg | Freq: Once | INTRAVENOUS | Status: AC
  Administered 2019-07-30: 1000 mg via INTRAVENOUS

## 2019-07-30 MED ORDER — HYDROMORPHONE HCL 1 MG/ML IJ SOLN
0.5000 mg | INTRAMUSCULAR | Status: DC | PRN
  Administered 2019-07-30 (×2): 0.5 mg via INTRAVENOUS

## 2019-07-30 MED ORDER — FENTANYL CITRATE (PF) 100 MCG/2ML IJ SOLN: 25.0000 ug | INTRAMUSCULAR | Status: DC | PRN

## 2019-07-30 MED ORDER — SUGAMMADEX SODIUM 200 MG/2ML IV SOLN
INTRAVENOUS | Status: DC | PRN
  Administered 2019-07-30: 200 mg via INTRAVENOUS

## 2019-07-30 MED ORDER — ACETAMINOPHEN 500 MG PO TABS
1000.0000 mg | ORAL_TABLET | Freq: Four times a day (QID) | ORAL | Status: DC
  Administered 2019-07-30 – 2019-07-31 (×5): 1000 mg via ORAL
  Filled 2019-07-30 (×5): qty 2

## 2019-07-30 MED ORDER — LIDOCAINE 2% (20 MG/ML) 5 ML SYRINGE
INTRAMUSCULAR | Status: DC | PRN
  Administered 2019-07-30: 60 mg via INTRAVENOUS

## 2019-07-30 MED ORDER — OXYCODONE HCL 5 MG/5ML PO SOLN: 5.0000 mg | Freq: Once | ORAL | Status: DC | PRN

## 2019-07-30 MED ORDER — FENTANYL CITRATE (PF) 250 MCG/5ML IJ SOLN
INTRAMUSCULAR | Status: DC | PRN
  Administered 2019-07-30 (×6): 50 ug via INTRAVENOUS
  Administered 2019-07-30: 100 ug via INTRAVENOUS
  Administered 2019-07-30 (×2): 50 ug via INTRAVENOUS

## 2019-07-30 MED ORDER — BUPIVACAINE LIPOSOME 1.3 % IJ SUSP
INTRAMUSCULAR | Status: DC | PRN
  Administered 2019-07-30: 20 mL

## 2019-07-30 MED ORDER — ONDANSETRON HCL 4 MG/2ML IJ SOLN
INTRAMUSCULAR | Status: AC
  Filled 2019-07-30: qty 2

## 2019-07-30 MED ORDER — SENNOSIDES-DOCUSATE SODIUM 8.6-50 MG PO TABS
1.0000 | ORAL_TABLET | Freq: Every day | ORAL | Status: DC
  Filled 2019-07-30 (×2): qty 1

## 2019-07-30 MED ORDER — LACTATED RINGERS IV SOLN: INTRAVENOUS | Status: DC | PRN

## 2019-07-30 MED ORDER — BISACODYL 5 MG PO TBEC
10.0000 mg | DELAYED_RELEASE_TABLET | Freq: Every day | ORAL | Status: DC
  Administered 2019-08-01: 10 mg via ORAL
  Filled 2019-07-30 (×2): qty 2

## 2019-07-30 MED ORDER — ROCURONIUM BROMIDE 10 MG/ML (PF) SYRINGE
PREFILLED_SYRINGE | INTRAVENOUS | Status: AC
  Filled 2019-07-30: qty 10

## 2019-07-30 MED ORDER — HYDROMORPHONE HCL 1 MG/ML IJ SOLN
0.2500 mg | INTRAMUSCULAR | Status: DC | PRN
  Administered 2019-07-30 (×4): 0.5 mg via INTRAVENOUS

## 2019-07-30 MED ORDER — GLYCOPYRROLATE PF 0.2 MG/ML IJ SOSY
PREFILLED_SYRINGE | INTRAMUSCULAR | Status: AC
  Filled 2019-07-30: qty 1

## 2019-07-30 MED ORDER — CARISOPRODOL 350 MG PO TABS: 350.0000 mg | ORAL_TABLET | Freq: Three times a day (TID) | ORAL | Status: DC | PRN

## 2019-07-30 MED ORDER — OXYCODONE HCL 5 MG PO TABS: 5.0000 mg | ORAL_TABLET | Freq: Once | ORAL | Status: DC | PRN

## 2019-07-30 MED ORDER — LUNG SURGERY BOOK
Freq: Once | Status: AC
  Filled 2019-07-30: qty 1

## 2019-07-30 MED ORDER — LACTATED RINGERS IV SOLN
INTRAVENOUS | Status: DC | PRN
Start: 2019-07-30 — End: 2019-07-30

## 2019-07-30 MED ORDER — PROMETHAZINE HCL 25 MG/ML IJ SOLN: 6.2500 mg | INTRAMUSCULAR | Status: DC | PRN

## 2019-07-30 MED ORDER — DEXAMETHASONE SODIUM PHOSPHATE 10 MG/ML IJ SOLN
INTRAMUSCULAR | Status: DC | PRN
  Administered 2019-07-30: 10 mg via INTRAVENOUS

## 2019-07-30 MED ORDER — DEXAMETHASONE SODIUM PHOSPHATE 10 MG/ML IJ SOLN
INTRAMUSCULAR | Status: AC
  Filled 2019-07-30: qty 1

## 2019-07-30 MED ORDER — OXYCODONE HCL 5 MG PO TABS: 5.0000 mg | ORAL_TABLET | ORAL | Status: DC | PRN

## 2019-07-30 MED ORDER — ACETAMINOPHEN 160 MG/5ML PO SOLN: 1000.0000 mg | Freq: Four times a day (QID) | ORAL | Status: DC

## 2019-07-30 MED ORDER — MIDAZOLAM HCL 2 MG/2ML IJ SOLN
INTRAMUSCULAR | Status: AC
  Filled 2019-07-30: qty 2

## 2019-07-30 MED ORDER — HYDROMORPHONE HCL 1 MG/ML IJ SOLN
1.0000 mg | INTRAMUSCULAR | Status: DC | PRN
  Administered 2019-07-30 – 2019-08-01 (×8): 1 mg via INTRAVENOUS
  Filled 2019-07-30 (×8): qty 1

## 2019-07-30 MED ORDER — MORPHINE SULFATE ER 15 MG PO TBCR
30.0000 mg | EXTENDED_RELEASE_TABLET | Freq: Three times a day (TID) | ORAL | Status: DC
  Administered 2019-07-30 – 2019-08-01 (×7): 30 mg via ORAL
  Filled 2019-07-30 (×7): qty 2

## 2019-07-30 MED ORDER — ROCURONIUM BROMIDE 10 MG/ML (PF) SYRINGE
PREFILLED_SYRINGE | INTRAVENOUS | Status: DC | PRN
  Administered 2019-07-30: 10 mg via INTRAVENOUS
  Administered 2019-07-30: 60 mg via INTRAVENOUS
  Administered 2019-07-30 (×2): 10 mg via INTRAVENOUS

## 2019-07-30 MED ORDER — 0.9 % SODIUM CHLORIDE (POUR BTL) OPTIME
TOPICAL | Status: DC | PRN
  Administered 2019-07-30: 2000 mL

## 2019-07-30 MED ORDER — ONDANSETRON HCL 4 MG/2ML IJ SOLN: 4.0000 mg | Freq: Four times a day (QID) | INTRAMUSCULAR | Status: DC | PRN

## 2019-07-30 MED ORDER — PHENYLEPHRINE HCL-NACL 10-0.9 MG/250ML-% IV SOLN
INTRAVENOUS | Status: DC | PRN
  Administered 2019-07-30: 25 ug/min via INTRAVENOUS

## 2019-07-30 MED ORDER — SODIUM CHLORIDE 0.9 % IV SOLN
3.0000 g | Freq: Four times a day (QID) | INTRAVENOUS | Status: DC
  Administered 2019-07-30 – 2019-08-01 (×8): 3 g via INTRAVENOUS
  Filled 2019-07-30: qty 3
  Filled 2019-07-30 (×3): qty 8
  Filled 2019-07-30 (×2): qty 3
  Filled 2019-07-30 (×3): qty 8
  Filled 2019-07-30 (×2): qty 3

## 2019-07-30 MED ORDER — SODIUM CHLORIDE 0.45 % IV SOLN: INTRAVENOUS | Status: DC

## 2019-07-30 MED ORDER — PROPOFOL 10 MG/ML IV BOLUS
INTRAVENOUS | Status: DC | PRN
  Administered 2019-07-30: 200 mg via INTRAVENOUS

## 2019-07-30 MED ORDER — TRAMADOL HCL 50 MG PO TABS: 50.0000 mg | ORAL_TABLET | Freq: Four times a day (QID) | ORAL | Status: DC | PRN

## 2019-07-30 MED ORDER — ONDANSETRON HCL 4 MG/2ML IJ SOLN
INTRAMUSCULAR | Status: DC | PRN
  Administered 2019-07-30: 4 mg via INTRAVENOUS

## 2019-07-30 SURGICAL SUPPLY — 100 items
APPLICATOR TIP COSEAL (VASCULAR PRODUCTS) IMPLANT
APPLICATOR TIP EXT COSEAL (VASCULAR PRODUCTS) IMPLANT
BLADE CLIPPER SURG (BLADE) ×3 IMPLANT
BLADE SURG 11 STRL SS (BLADE) ×3 IMPLANT
CANISTER SUCT 3000ML PPV (MISCELLANEOUS) ×4 IMPLANT
CATH THORACIC 28FR (CATHETERS) ×1 IMPLANT
CATH THORACIC 36FR (CATHETERS) IMPLANT
CATH THORACIC 36FR RT ANG (CATHETERS) IMPLANT
CHLORAPREP W/TINT 26 (MISCELLANEOUS) ×3 IMPLANT
CLEANER TIP ELECTROSURG 2X2 (MISCELLANEOUS) ×3 IMPLANT
CNTNR URN SCR LID CUP LEK RST (MISCELLANEOUS) ×6 IMPLANT
CONN ST 1/4X3/8  BEN (MISCELLANEOUS) ×16
CONN ST 1/4X3/8 BEN (MISCELLANEOUS) IMPLANT
CONN Y 3/8X3/8X3/8  BEN (MISCELLANEOUS) ×8
CONN Y 3/8X3/8X3/8 BEN (MISCELLANEOUS) IMPLANT
CONT SPEC 4OZ STRL OR WHT (MISCELLANEOUS) ×8
COVER SURGICAL LIGHT HANDLE (MISCELLANEOUS) ×3 IMPLANT
DERMABOND ADVANCED (GAUZE/BANDAGES/DRESSINGS) ×1
DERMABOND ADVANCED .7 DNX12 (GAUZE/BANDAGES/DRESSINGS) ×3 IMPLANT
DISSECTOR BLUNT TIP ENDO 5MM (MISCELLANEOUS) IMPLANT
DRAIN CHANNEL 28F RND 3/8 FF (WOUND CARE) IMPLANT
DRAPE INCISE 13X13 STRL (DRAPES) ×2 IMPLANT
DRAPE LAPAROSCOPIC ABDOMINAL (DRAPES) ×4 IMPLANT
DRAPE WARM FLUID 44X44 (DRAPES) ×4 IMPLANT
ELECT BLADE 4.0 EZ CLEAN MEGAD (MISCELLANEOUS) ×4
ELECT REM PT RETURN 9FT ADLT (ELECTROSURGICAL) ×4
ELECTRODE BLDE 4.0 EZ CLN MEGD (MISCELLANEOUS) ×3 IMPLANT
ELECTRODE REM PT RTRN 9FT ADLT (ELECTROSURGICAL) ×3 IMPLANT
GAUZE SPONGE 4X4 12PLY STRL (GAUZE/BANDAGES/DRESSINGS) ×5 IMPLANT
GAUZE SPONGE 4X4 12PLY STRL LF (GAUZE/BANDAGES/DRESSINGS) ×1 IMPLANT
GLOVE BIO SURGEON STRL SZ 6.5 (GLOVE) ×1 IMPLANT
GLOVE BIO SURGEON STRL SZ7.5 (GLOVE) ×10 IMPLANT
GLOVE BIOGEL PI IND STRL 6 (GLOVE) IMPLANT
GLOVE BIOGEL PI INDICATOR 6 (GLOVE) ×2
GLOVE NEODERM STRL 7.5  LF PF (GLOVE) ×6
GLOVE NEODERM STRL 7.5 LF PF (GLOVE) IMPLANT
GLOVE SURG NEODERM 7.5  LF PF (GLOVE) ×2
GOWN STRL REUS W/ TWL LRG LVL3 (GOWN DISPOSABLE) ×9 IMPLANT
GOWN STRL REUS W/ TWL XL LVL3 (GOWN DISPOSABLE) ×3 IMPLANT
GOWN STRL REUS W/TWL LRG LVL3 (GOWN DISPOSABLE) ×16
GOWN STRL REUS W/TWL XL LVL3 (GOWN DISPOSABLE)
KIT BASIN OR (CUSTOM PROCEDURE TRAY) ×4 IMPLANT
KIT SUCTION CATH 14FR (SUCTIONS) ×4 IMPLANT
KIT TURNOVER KIT B (KITS) ×4 IMPLANT
NDL HYPO 25GX1X1/2 BEV (NEEDLE) IMPLANT
NEEDLE 22X1 1/2 (OR ONLY) (NEEDLE) ×1 IMPLANT
NEEDLE HYPO 25GX1X1/2 BEV (NEEDLE) IMPLANT
NS IRRIG 1000ML POUR BTL (IV SOLUTION) ×14 IMPLANT
PACK CHEST (CUSTOM PROCEDURE TRAY) ×4 IMPLANT
PAD ARMBOARD 7.5X6 YLW CONV (MISCELLANEOUS) ×8 IMPLANT
PASSER SUT SWANSON 36MM LOOP (INSTRUMENTS) IMPLANT
SCISSORS LAP 5X35 DISP (ENDOMECHANICALS) IMPLANT
SEALANT PROGEL (MISCELLANEOUS) IMPLANT
SEALANT SURG COSEAL 4ML (VASCULAR PRODUCTS) IMPLANT
SEALANT SURG COSEAL 8ML (VASCULAR PRODUCTS) IMPLANT
STOPCOCK 4 WAY LG BORE MALE ST (IV SETS) IMPLANT
SUT MNCRL AB 4-0 PS2 18 (SUTURE) ×1 IMPLANT
SUT PROLENE 3 0 SH DA (SUTURE) IMPLANT
SUT PROLENE 4 0 RB 1 (SUTURE)
SUT PROLENE 4-0 RB1 .5 CRCL 36 (SUTURE) IMPLANT
SUT SILK  1 MH (SUTURE) ×8
SUT SILK 1 MH (SUTURE) ×3 IMPLANT
SUT SILK 1 TIES 10X30 (SUTURE) IMPLANT
SUT SILK 2 0 SH CR/8 (SUTURE) ×1 IMPLANT
SUT SILK 2 0SH CR/8 30 (SUTURE) IMPLANT
SUT SILK 3 0SH CR/8 30 (SUTURE) IMPLANT
SUT VIC AB 0 CT1 27 (SUTURE) ×4
SUT VIC AB 0 CT1 27XBRD ANBCTR (SUTURE) IMPLANT
SUT VIC AB 1 CTX 18 (SUTURE) IMPLANT
SUT VIC AB 1 CTX 36 (SUTURE)
SUT VIC AB 1 CTX36XBRD ANBCTR (SUTURE) IMPLANT
SUT VIC AB 2-0 CT1 27 (SUTURE) ×4
SUT VIC AB 2-0 CT1 TAPERPNT 27 (SUTURE) ×3 IMPLANT
SUT VIC AB 2-0 CTX 36 (SUTURE) IMPLANT
SUT VIC AB 3-0 SH 27 (SUTURE)
SUT VIC AB 3-0 SH 27X BRD (SUTURE) ×3 IMPLANT
SUT VIC AB 3-0 X1 27 (SUTURE) IMPLANT
SUT VICRYL 0 UR6 27IN ABS (SUTURE) ×6 IMPLANT
SUT VICRYL 2 TP 1 (SUTURE) IMPLANT
SWAB COLLECTION DEVICE MRSA (MISCELLANEOUS) IMPLANT
SWAB CULTURE ESWAB REG 1ML (MISCELLANEOUS) IMPLANT
SYR 10ML LL (SYRINGE) IMPLANT
SYR 20ML LL LF (SYRINGE) ×3 IMPLANT
SYR 50ML LL SCALE MARK (SYRINGE) IMPLANT
SYR CONTROL 10ML LL (SYRINGE) ×1 IMPLANT
SYSTEM SAHARA CHEST DRAIN ATS (WOUND CARE) ×4 IMPLANT
TAPE CLOTH 4X10 WHT NS (GAUZE/BANDAGES/DRESSINGS) ×4 IMPLANT
TAPE CLOTH SURG 4X10 WHT LF (GAUZE/BANDAGES/DRESSINGS) ×1 IMPLANT
TAPE UMBILICAL COTTON 1/8X30 (MISCELLANEOUS) ×3 IMPLANT
TIP APPLICATOR SPRAY EXTEND 16 (VASCULAR PRODUCTS) IMPLANT
TOWEL GREEN STERILE (TOWEL DISPOSABLE) ×8 IMPLANT
TOWEL GREEN STERILE FF (TOWEL DISPOSABLE) ×4 IMPLANT
TRAP SPECIMEN MUCUS 40CC (MISCELLANEOUS) ×3 IMPLANT
TRAY FOLEY SLVR 16FR LF STAT (SET/KITS/TRAYS/PACK) ×4 IMPLANT
TROCAR XCEL 12X100 BLDLESS (ENDOMECHANICALS) ×4 IMPLANT
TUBING EXTENTION W/L.L. (IV SETS) IMPLANT
TUNNELER SHEATH ON-Q 11GX8 DSP (PAIN MANAGEMENT) IMPLANT
VALVE BIOPSY  SINGLE USE (MISCELLANEOUS) ×8
VALVE BIOPSY SINGLE USE (MISCELLANEOUS) IMPLANT
WATER STERILE IRR 1000ML POUR (IV SOLUTION) ×8 IMPLANT

## 2019-07-30 NOTE — Progress Notes (Signed)
   Name: Roberto Blair MRN: [id number] DOB: 1965/12/10    ADMISSION DATE:  07/27/2019 CONSULTATION DATE: 07/29/2019  REFERRING MD : Teaching service  CHIEF COMPLAINT: Right-sided chest pain, right-sided loculated effusion  HISTORY OF PRESENT ILLNESS:  54 year old 1 pack/day smoker presenting with several weeks of chest pain, coughing.  Treated for pleurisy as an outpatient with prednisone. CT on admission with no pulmonary emboli but recommend lobe and lower lobe consolidation with loculated effusion. S/p thoracentesis on 5/31.  PCCM consulted for help with management.  PAST MEDICAL HISTORY :   has a past medical history of Anxiety, Chronic back pain, GERD (gastroesophageal reflux disease), Hypertension, Lumbar disc herniation, OCD (obsessive compulsive disorder), and Sciatica.  has a past surgical history that includes Appendectomy; TMJ Arthroplasty; Lumbar epidural injection; and Trigger point injection.   SIGNIFICANT EVENTS  6/1 Pulmonary and critical care consult 6/2 VATS decortication  STUDIES:  Pleural fluid LDH 335, total protein 4.7, 105 K cells with 69% neutrophils  Sputum culture 5/31-GNR, GPC  CTA 5/30-moderate loculated right effusion with right lower lobe, middle lobe consolidation, background emphysema.  I have reviewed the images personally.  SUBJECTIVE:   C/O pain at chest tube site    VITAL SIGNS: Temp:  [97 F (36.1 C)-99.6 F (37.6 C)] 97.6 F (36.4 C) (06/02 1535) Pulse Rate:  [61-101] 61 (06/02 1535) Resp:  [18-22] 19 (06/02 1535) BP: (119-141)/(53-77) 119/70 (06/02 1535) SpO2:  [91 %-98 %] 96 % (06/02 1535) Arterial Line BP: (161-165)/(68) 165/68 (06/02 1103)  PHYSICAL EXAMINATION: Gen:      No acute distress HEENT:  EOMI, sclera anicteric Neck:     No masses; no thyromegaly Lungs:    Clear to auscultation bilaterally; normal respiratory effort CV:         Regular rate and rhythm; no murmurs Abd:      + bowel sounds; soft, non-tender; no  palpable masses, no distension Ext:    No edema; adequate peripheral perfusion Skin:      Warm and dry; no rash Neuro: alert and oriented x 3 Psych: normal mood and affect  ASSESSMENT/PLAN: Right lung CAP with para pneumonic loculated effusion. Pleural fluid studies with exudative neutrophilic predominant inflammatory fluid consistent with parapneumonic process S/p VATS decortication and bronchoscopy by cardiothoracic surgery Follow final cultures Continue Unasyn  PCCM will be available as needed.  Please call with any questions.  Chilton Greathouse MD [facility] Please see [email or web address] for pager details.  07/30/2019, 6:06 PM

## 2019-07-30 NOTE — Anesthesia Procedure Notes (Signed)
Procedure Name: Intubation Date/Time: 07/30/2019 10:21 AM Performed by: Mayer Camel, CRNA Pre-anesthesia Checklist: Patient identified, Emergency Drugs available, Suction available and Patient being monitored Patient Re-evaluated:Patient Re-evaluated prior to induction Oxygen Delivery Method: Circle System Utilized Preoxygenation: Pre-oxygenation with 100% oxygen Induction Type: Inhalational induction with existing ETT Ventilation: Mask ventilation without difficulty Grade View: Grade II Tube type: Oral Tube size: 8.5 mm Number of attempts: 1 Airway Equipment and Method: Stylet and Bougie stylet Placement Confirmation: positive ETCO2 and breath sounds checked- equal and bilateral Secured at: 23 cm Tube secured with: Tape Dental Injury: Teeth and Oropharynx as per pre-operative assessment  Comments: DLT exchanged to single lumen 8.5 ETT with use of bougie stylet.  Bilateral breath sounds and positive ETCO2.  Performed by Eula Fried, supervised by CRNA and MDA

## 2019-07-30 NOTE — Transfer of Care (Signed)
Immediate Anesthesia Transfer of Care Note  Patient: Roberto Blair  Procedure(s) Performed: VIDEO BRONCHOSCOPY (N/A ) VIDEO ASSISTED THORACOSCOPY (VATS)/DECORTICATION (Right Chest) EMPYEMA DRAINAGE (Right )  Patient Location: PACU  Anesthesia Type:General  Level of Consciousness: awake and alert   Airway & Oxygen Therapy: Patient Spontanous Breathing and Patient connected to face mask oxygen  Post-op Assessment: Report given to RN, Post -op Vital signs reviewed and stable and Patient moving all extremities  Post vital signs: Reviewed and stable  Last Vitals:  Vitals Value Taken Time  BP 130/71 07/30/19 1048  Temp    Pulse 90 07/30/19 1054  Resp 20 07/30/19 1054  SpO2 100 % 07/30/19 1054  Vitals shown include unvalidated device data.  Last Pain:  Vitals:   07/29/19 1946  TempSrc:   PainSc: 4       Patients Stated Pain Goal: 4 (07/29/19 1946)  Complications: No apparent anesthesia complications

## 2019-07-30 NOTE — Anesthesia Postprocedure Evaluation (Signed)
Anesthesia Post Note  Patient: Roberto Blair  Procedure(s) Performed: VIDEO BRONCHOSCOPY (N/A ) VIDEO ASSISTED THORACOSCOPY (VATS)/DECORTICATION (Right Chest) EMPYEMA DRAINAGE (Right )     Patient location during evaluation: PACU Anesthesia Type: General Level of consciousness: awake Pain control: treating pain with additional IV medications  Vital Signs Assessment: post-procedure vital signs reviewed and stable Respiratory status: spontaneous breathing, nonlabored ventilation, respiratory function stable and patient connected to nasal cannula oxygen Cardiovascular status: blood pressure returned to baseline and stable Postop Assessment: no apparent nausea or vomiting Anesthetic complications: no    Last Vitals:  Vitals:   07/30/19 1252 07/30/19 1500  BP: 138/77   Pulse: 67 66  Resp: 18 19  Temp: 36.8 C   SpO2: 91% 92%    Last Pain:  Vitals:   07/30/19 1337  TempSrc:   PainSc: 10-Worst pain ever                 Shani Fitch P Ramesses Crampton

## 2019-07-30 NOTE — Brief Op Note (Signed)
07/30/2019  10:45 AM  PATIENT:  Arna Medici Didion  54 y.o. male  PRE-OPERATIVE DIAGNOSIS:  RIGHT LOCULATED EFFUSION  POST-OPERATIVE DIAGNOSIS:  RIGHT LOCULATED EFFUSION  PROCEDURE:  Procedure(s): VIDEO BRONCHOSCOPY (N/A) VIDEO ASSISTED THORACOSCOPY (VATS)/DECORTICATION (Right) EMPYEMA DRAINAGE (Right)  SURGEON:  Surgeon(s) and Role:    * Linden Dolin, MD - Primary  PHYSICIAN ASSISTANT: Barrett, E PA-C  ASSISTANTS: staff   ANESTHESIA:   general  EBL:  30 mL   BLOOD ADMINISTERED:none  DRAINS: 2 Chest Tube(s) in the right pleural space   LOCAL MEDICATIONS USED:  OTHER Exparel   SPECIMEN:  Source of Specimen:  pleural fluid, pleural peel  DISPOSITION OF SPECIMEN:  PATHOLOGY  COUNTS:  YES  TOURNIQUET:  * No tourniquets in log *  DICTATION: .Note written in EPIC  PLAN OF CARE: Admit to inpatient   PATIENT DISPOSITION:  PACU - hemodynamically stable.   Delay start of Pharmacological VTE agent (>24hrs) due to surgical blood loss or risk of bleeding: yes

## 2019-07-30 NOTE — H&P (Signed)
History and Physical Interval Note:  07/30/2019 8:14 AM  Roberto Blair  has presented today for surgery, with the diagnosis of RIGHT LOCULATED EFFUSION.  The various methods of treatment have been discussed with the patient and family. After consideration of risks, benefits and other options for treatment, the patient has consented to  Procedure(s): VIDEO BRONCHOSCOPY (N/A) VIDEO ASSISTED THORACOSCOPY (VATS)/DECORTICATION (Right) EMPYEMA DRAINAGE (Right) as a surgical intervention.  The patient's history has been reviewed, patient examined, no change in status, stable for surgery.  I have reviewed the patient's chart and labs.  Questions were answered to the patient's satisfaction.     Linden Dolin

## 2019-07-30 NOTE — Anesthesia Procedure Notes (Signed)
Arterial Line Insertion Start/End6/03/2019 7:55 AM, 07/30/2019 8:05 AM Performed by: Mayer Camel, CRNA, CRNA  Patient location: OOR procedure area. Preanesthetic checklist: patient identified, IV checked, site marked, risks and benefits discussed, surgical consent, monitors and equipment checked, pre-op evaluation, timeout performed and anesthesia consent Lidocaine 1% used for infiltration and patient sedated radial was placed Catheter size: 20 Fr Hand hygiene performed  and maximum sterile barriers used  Allen's test indicative of satisfactory collateral circulation Attempts: 1 Procedure performed without using ultrasound guided technique. Following insertion, dressing applied and Biopatch. Post procedure assessment: normal and unchanged  Patient tolerated the procedure well with no immediate complications. Additional procedure comments: Performed by Tressia Miners, SRNA under the supervision of CRNA.

## 2019-07-30 NOTE — Brief Op Note (Signed)
07/27/2019 - 07/30/2019  10:16 AM  PATIENT:  Arna Medici Overby  54 y.o. male  PRE-OPERATIVE DIAGNOSIS:  RIGHT LOCULATED EFFUSION  POST-OPERATIVE DIAGNOSIS:  RIGHT LOCULATED EFFUSION  PROCEDURE:  Procedure(s):  VIDEO BRONCHOSCOPY (N/A) VIDEO ASSISTED THORACOSCOPY (VATS)/DECORTICATION (Right) EMPYEMA DRAINAGE (Right)  SURGEON:  Surgeon(s) and Role:    * Linden Dolin, MD - Primary  PHYSICIAN ASSISTANT: Priscilla Finklea PA-C  ANESTHESIA:   general  EBL:  30 mL   BLOOD ADMINISTERED:none  DRAINS: 28 Straight and ankled chest tubes   LOCAL MEDICATIONS USED:  MARCAINE     SPECIMEN:  Source of Specimen:  pleural fluid, peel  DISPOSITION OF SPECIMEN:  PATHOLOGY  COUNTS:  YES  TOURNIQUET:  * No tourniquets in log *  DICTATION: .Dragon Dictation  PLAN OF CARE: PACU, and return to previous room  PATIENT DISPOSITION:  PACU - hemodynamically stable.   Delay start of Pharmacological VTE agent (>24hrs) due to surgical blood loss or risk of bleeding: yes

## 2019-07-30 NOTE — Anesthesia Procedure Notes (Signed)
Procedure Name: Intubation Date/Time: 07/30/2019 8:38 AM Performed by: Alain Marion, CRNA Pre-anesthesia Checklist: Patient identified, Emergency Drugs available, Suction available and Patient being monitored Patient Re-evaluated:Patient Re-evaluated prior to induction Oxygen Delivery Method: Circle System Utilized Preoxygenation: Pre-oxygenation with 100% oxygen Induction Type: IV induction Ventilation: Mask ventilation without difficulty and Oral airway inserted - appropriate to patient size Laryngoscope Size: Mac and 4 Grade View: Grade II Endobronchial tube: Left and Double lumen EBT and 39 Fr Number of attempts: 1 Airway Equipment and Method: Stylet and Oral airway Placement Confirmation: ETT inserted through vocal cords under direct vision,  positive ETCO2 and breath sounds checked- equal and bilateral Secured at: 31 cm Tube secured with: Tape Dental Injury: Teeth and Oropharynx as per pre-operative assessment  Comments: Viva-sight video DLT used, placement confirmed. Pt with poor dentition, remains the same as pre-op. Performed by Reece Agar, SRNA under the supervision of CRNA and MDA

## 2019-07-30 NOTE — Progress Notes (Signed)
Subjective: Patient see in PACU after his surgery. Underwent video brochoscopy, VATS/decortication and empyema drainage. He now has 2 chest tubes in place. He is complaining of pain and of his urinary catheter. We discussed we would provide him IV pain medication. He was amenable to that.  Consults: IR  Objective:  Vital signs in last 24 hours: Vitals:   07/30/19 1048 07/30/19 1103 07/30/19 1120 07/30/19 1134  BP: 130/71 (!) 121/53 134/65 132/69  Pulse: (!) 101 85 77 79  Resp: (!) 22 19 18 20   Temp: (!) 97 F (36.1 C)     TempSrc:      SpO2:  98% 92% 92%  Height:       General: resting in bed in PACU; wash cloth on forehead Cardiac: RRR Pulm: breathing comfortably; placed on Grady  Psych: frustrated   I/Os:  Intake/Output Summary (Last 24 hours) at 07/30/2019 1151 Last data filed at 07/30/2019 1120 Gross per 24 hour  Intake 2080 ml  Output 335 ml  Net 1745 ml   Telemetry:  Labs: Results for orders placed or performed during the hospital encounter of 07/27/19 (from the past 24 hour(s))  Urinalysis, Routine w reflex microscopic     Status: Abnormal   Collection Time: 07/29/19  7:24 PM  Result Value Ref Range   Color, Urine YELLOW YELLOW   APPearance CLEAR CLEAR   Specific Gravity, Urine 1.025 1.005 - 1.030   pH 5.0 5.0 - 8.0   Glucose, UA NEGATIVE NEGATIVE mg/dL   Hgb urine dipstick NEGATIVE NEGATIVE   Bilirubin Urine NEGATIVE NEGATIVE   Ketones, ur NEGATIVE NEGATIVE mg/dL   Protein, ur NEGATIVE NEGATIVE mg/dL   Nitrite NEGATIVE NEGATIVE   Leukocytes,Ua TRACE (A) NEGATIVE   RBC / HPF 0-5 0 - 5 RBC/hpf   WBC, UA 0-5 0 - 5 WBC/hpf   Bacteria, UA RARE (A) NONE SEEN   Mucus PRESENT   Blood gas, arterial on room air     Status: Abnormal   Collection Time: 07/29/19  8:00 PM  Result Value Ref Range   FIO2 21.00    pH, Arterial 7.445 7.350 - 7.450   pCO2 arterial 34.9 32.0 - 48.0 mmHg   pO2, Arterial 62.2 (L) 83.0 - 108.0 mmHg   Bicarbonate 23.5 20.0 - 28.0 mmol/L   Acid-base deficit 0.0 0.0 - 2.0 mmol/L   O2 Saturation 91.0 %   Patient temperature 37.3    Collection site RIGHT RADIAL    Drawn by [phone]    Sample type ARTERIAL    Allens test (pass/fail) PASS PASS  CBC     Status: Abnormal   Collection Time: 07/29/19  8:27 PM  Result Value Ref Range   WBC 11.1 (H) 4.0 - 10.5 K/uL   RBC 4.28 4.22 - 5.81 MIL/uL   Hemoglobin 12.6 (L) 13.0 - 17.0 g/dL   HCT 38.1 (L) 39.0 - 52.0 %   MCV 89.0 80.0 - 100.0 fL   MCH 29.4 26.0 - 34.0 pg   MCHC 33.1 30.0 - 36.0 g/dL   RDW 13.8 11.5 - 15.5 %   Platelets 303 150 - 400 K/uL   nRBC 0.0 0.0 - 0.2 %  Comprehensive metabolic panel     Status: Abnormal   Collection Time: 07/29/19  8:27 PM  Result Value Ref Range   Sodium 136 135 - 145 mmol/L   Potassium 4.7 3.5 - 5.1 mmol/L   Chloride 99 98 - 111 mmol/L   CO2 27 22 - 32 mmol/L  Glucose, Bld 118 (H) 70 - 99 mg/dL   BUN 10 6 - 20 mg/dL   Creatinine, Ser 3.01 0.61 - 1.24 mg/dL   Calcium 8.3 (L) 8.9 - 10.3 mg/dL   Total Protein 6.6 6.5 - 8.1 g/dL   Albumin 2.4 (L) 3.5 - 5.0 g/dL   AST 15 15 - 41 U/L   ALT 27 0 - 44 U/L   Alkaline Phosphatase 108 38 - 126 U/L   Total Bilirubin 0.4 0.3 - 1.2 mg/dL   GFR calc non Af Amer >60 >60 mL/min   GFR calc Af Amer >60 >60 mL/min   Anion gap 10 5 - 15  Protime-INR     Status: Abnormal   Collection Time: 07/29/19  8:27 PM  Result Value Ref Range   Prothrombin Time 15.8 (H) 11.4 - 15.2 seconds   INR 1.3 (H) 0.8 - 1.2  APTT     Status: Abnormal   Collection Time: 07/29/19  8:27 PM  Result Value Ref Range   aPTT 43 (H) 24 - 36 seconds  Type and screen [facility]     Status: None   Collection Time: 07/29/19  8:27 PM  Result Value Ref Range   ABO/RH(D) O POS    Antibody Screen NEG    Sample Expiration      08/01/2019,2359 Performed at [facility], [address] [place], [place] [address]   ABO/Rh     Status: None   Collection Time: 07/29/19  8:27 PM  Result Value Ref Range    ABO/RH(D)      O POS Performed at [facility], [address], [place], [place] [address]   MRSA PCR Screening     Status: None   Collection Time: 07/29/19  9:59 PM   Specimen: Nasal Mucosa; Nasopharyngeal  Result Value Ref Range   MRSA by PCR NEGATIVE NEGATIVE  CBC with Differential/Platelet     Status: Abnormal   Collection Time: 07/30/19  2:11 AM  Result Value Ref Range   WBC 10.9 (H) 4.0 - 10.5 K/uL   RBC 3.98 (L) 4.22 - 5.81 MIL/uL   Hemoglobin 11.7 (L) 13.0 - 17.0 g/dL   HCT 73.2 (L) 20.2 - 54.2 %   MCV 88.7 80.0 - 100.0 fL   MCH 29.4 26.0 - 34.0 pg   MCHC 33.1 30.0 - 36.0 g/dL   RDW 70.6 23.7 - 62.8 %   Platelets 292 150 - 400 K/uL   nRBC 0.0 0.0 - 0.2 %   Neutrophils Relative % 67 %   Neutro Abs 7.4 1.7 - 7.7 K/uL   Lymphocytes Relative 19 %   Lymphs Abs 2.1 0.7 - 4.0 K/uL   Monocytes Relative 9 %   Monocytes Absolute 1.0 0.1 - 1.0 K/uL   Eosinophils Relative 3 %   Eosinophils Absolute 0.3 0.0 - 0.5 K/uL   Basophils Relative 1 %   Basophils Absolute 0.1 0.0 - 0.1 K/uL   Immature Granulocytes 1 %   Abs Immature Granulocytes 0.09 (H) 0.00 - 0.07 K/uL   Imaging: No results found. Assessment/Plan:  Kaine Mcquillen is a 54 yo M w/ a PMHx notable for GERD, anxiety, HTN, OCD, sciatica, and chronic opioid use disorder who presented with progressively worsening chest pain, cough and shortness of breath concerning for pneumonia versus post obstructive effusion.  Given severity in pleuritic pain and shortness of breath admission was requested.  Assessment and Plan: CAP: Parapneumonic effusion: Pleuritis: Possible pneumonia vs  obstructive neoplasm. This was rapidly developing when comparing his CXR from 5/27 to admission. Thoracentesis performed yesterday. Resulted labs consistent with exudative process. Only 150 ml drained. Procal normal making malignancy higher on the differential. Chest pain is consistent with pleuritis due to his effusion. Troponin was 3, and EKG  unremarkable. No overt MSK pathology. -s/p thora demonstrating exudative effusion w/ cytology pending -pt continued to have significant pain which interferes with his breathing -consulted pulm for recs on management of pleural effusion and for possible bronch and they recommended starting unasyn and consulting CT surgery  -CT surgery performed a video brochoscopy, VATS/decortication and empyema drainage today -2 right sided chest tubes placed -patient continues to be in significant pain today and we discussed using IV pain medication and patient amenable  Plan: -Continue CTX and unasyn -fu CT surgery recs if any -fu surgical pathology -Continue home morphine (MS contin) 30mg  q12 hours -1 mg dilaudid q4 for breakthrough pain -duonebs q6 hours PRN for wheezing or dyspnea -Flutter valve and incentive spirometry  -atarax prn for anxiety  Emphysema of the lung: Noted on Chest CT, which is in conjunction with his smoking history. May need alpha-one antitrypsin testing as an outpatient but will defer to PCP.   Plan: -Continue home Lama/Laba combo Anoro ellipta -Duonebs continued -F/up with PFT's as an outpatient  Chronic tobacco use: Recommended cessation. Patient is not ready to quit. Continue outpatient guidance.   Attention and concentration deficit: Continue home Adderall.  Chronic bilateral lower back pain with sciatica Chronic prescription opioid use disorder: Continue home morphine and gabapentin  Plan: Principal Problem:   Pneumonia Active Problems:   Chronic bilateral low back pain with sciatica   Tobacco use   Attention and concentration deficit   Chronic prescription opiate use   Emphysema of lung (HCC)   Pleural effusion on right  Dispo: Anticipated discharge in approximately 1 day.   , MD 07/30/2019, 11:51 AM Pager: [phone]

## 2019-07-30 NOTE — Progress Notes (Addendum)
Pharmacy Antibiotic Note  Roberto Blair is a 54 y.o. male admitted on 07/27/2019 with RLL pneumonia and possible empyema. Pt is S/P video-assisted bronch, VATS, R empyema drainage this afternoon. Pharmacy has been consulted for Unasyn dosing for empyema (see below for pt's prior antibiotic therapy during this admission).  WBC 10.9, Tmax 99.6, Scr 0.83; CrCl 121 ml/min (Scr trending up slightly since admission)  Plan: Unasyn 3 gm IV Q 6 hrs (last dose given ~8:45 this AM) Monitor WBC, temp, clinical improvement, cultures, renal function  Height: 6' 0.1" (183.1 cm) IBW/kg (Calculated) : 77.83  Temp (24hrs), Avg:98.3 F (36.8 C), Min:97 F (36.1 C), Max:99.6 F (37.6 C)  Recent Labs  Lab 07/27/19 1458 07/28/19 0432 07/29/19 0642 07/29/19 2027 07/30/19 0211  WBC 16.0* 13.0* 11.6* 11.1* 10.9*  CREATININE 0.72 0.74  --  0.83  --     Estimated Creatinine Clearance: 121.3 mL/min (by C-G formula based on SCr of 0.83 mg/dL).    Allergies  Allergen Reactions  . Aspirin Other (See Comments)    Causes ulcers  . Nsaids Other (See Comments)    Causes ulcers    Antimicrobials this admission: 5/30 Azithromycin X 1 5/30-5/31 Ceftriaxone 5/31-6/1 Metronidazole 6/1 >> Unasyn  Microbiology results: 5/31 Sputum: Gram stain: abundant WBC (PMNs), mod GPC, few yeast, rare GNR, cx consistent with normal respiratory flora 6/1 MRSA PCR:  negative 5/30 COVID: negative 5/31 HIV screen: negative 5/31 Pleural fluid: Gram stain: abundant WBC (PMNs, NOS), cx pending; AFB neg (cx pending); fungal cx pending 6/2 Pleural fluid, R loculated effusion: Gram stain: rare WBC (PMNs), NOS, cx pending  Thank you for allowing pharmacy to be a part of this patient's care.  Vicki Mallet, PharmD, BCPS, [facility] Clinical Pharmacist 07/30/2019 3:51 PM

## 2019-07-31 ENCOUNTER — Inpatient Hospital Stay (HOSPITAL_COMMUNITY): Payer: 59

## 2019-07-31 LAB — BASIC METABOLIC PANEL
Anion gap: 8 (ref 5–15)
BUN: 7 mg/dL (ref 6–20)
CO2: 24 mmol/L (ref 22–32)
Calcium: 8.2 mg/dL — ABNORMAL LOW (ref 8.9–10.3)
Chloride: 101 mmol/L (ref 98–111)
Creatinine, Ser: 0.66 mg/dL (ref 0.61–1.24)
GFR calc Af Amer: >60 mL/min (ref >60)
GFR calc non Af Amer: >60 mL/min (ref >60)
Glucose, Bld: 152 mg/dL — ABNORMAL HIGH (ref 70–99)
Potassium: 4.5 mmol/L (ref 3.5–5.1)
Sodium: 133 mmol/L — ABNORMAL LOW (ref 135–145)

## 2019-07-31 LAB — SURGICAL PATHOLOGY

## 2019-07-31 LAB — CBC
HCT: 37.1 % — ABNORMAL LOW (ref 39.0–52.0)
Hemoglobin: 12.4 g/dL — ABNORMAL LOW (ref 13.0–17.0)
MCH: 29.7 pg (ref 26.0–34.0)
MCHC: 33.4 g/dL (ref 30.0–36.0)
MCV: 89 fL (ref 80.0–100.0)
Platelets: 322 10*3/uL (ref 150–400)
RBC: 4.17 MIL/uL — ABNORMAL LOW (ref 4.22–5.81)
RDW: 13.5 % (ref 11.5–15.5)
WBC: 20.8 10*3/uL — ABNORMAL HIGH (ref 4.0–10.5)
nRBC: 0 % (ref 0.0–0.2)

## 2019-07-31 MED ORDER — QUETIAPINE FUMARATE 100 MG PO TABS
200.0000 mg | ORAL_TABLET | Freq: Every day | ORAL | Status: DC
  Administered 2019-07-31: 200 mg via ORAL
  Filled 2019-07-31: qty 2

## 2019-07-31 MED ORDER — GABAPENTIN 400 MG PO CAPS
800.0000 mg | ORAL_CAPSULE | Freq: Three times a day (TID) | ORAL | Status: DC
  Administered 2019-07-31 – 2019-08-01 (×3): 800 mg via ORAL
  Filled 2019-07-31 (×3): qty 2

## 2019-07-31 NOTE — Progress Notes (Signed)
PCCM progress note  We were asked by internal medicine team about need for fiberoptic bronchoscopy Discussed with cardiothoracic surgery team and clarified that Dr. Vickey Sages did bronchoscopy at end of VATS procedure with no mass identified.  Secretions are clear  No additional procedure needed at this point.  Chilton Greathouse MD [facility] Please see [email or web address] for pager details.  07/31/2019, 8:37 AM

## 2019-07-31 NOTE — Progress Notes (Signed)
Subjective: Patient out walking with physical therapy when we first came by on morning rounds. See sitting up in chair when we returned. He reports significant improvement in his breathing. He is relieved to be able to take a full breath. His remaining pain is from his chest tubes and the incisions. CT surgery was in the room on rounds and she reported that the patient may be able to get a chest tube out today and then another hopefully tomorrow after which he can be discharged. He was amenable to this.  Consults: Pulm, CT surg  Objective:  Vital signs in last 24 hours: Vitals:   07/31/19 0331 07/31/19 0339 07/31/19 0352 07/31/19 0752  BP:   127/61 124/63  Pulse: 69 63 68 76  Resp: 19 19 15 16   Temp:   98.7 F (37.1 C) 98 F (36.7 C)  TempSrc:   Oral Axillary  SpO2: 91% 93% 92% 92%  Height:       General: sitting comfortably in chair Cardiac: RRR Pulm: breathing comfortably, decreased breath sounds RLL   I/Os:  Intake/Output Summary (Last 24 hours) at 07/31/2019 1004 Last data filed at 07/31/2019 0730 Gross per 24 hour  Intake 3713.51 ml  Output 1352 ml  Net 2361.51 ml   Telemetry:  Labs: Results for orders placed or performed during the hospital encounter of 07/27/19 (from the past 24 hour(s))  CBC     Status: Abnormal   Collection Time: 07/31/19  1:03 AM  Result Value Ref Range   WBC 20.8 (H) 4.0 - 10.5 K/uL   RBC 4.17 (L) 4.22 - 5.81 MIL/uL   Hemoglobin 12.4 (L) 13.0 - 17.0 g/dL   HCT 37.1 (L) 39.0 - 52.0 %   MCV 89.0 80.0 - 100.0 fL   MCH 29.7 26.0 - 34.0 pg   MCHC 33.4 30.0 - 36.0 g/dL   RDW 13.5 11.5 - 15.5 %   Platelets 322 150 - 400 K/uL   nRBC 0.0 0.0 - 0.2 %  Basic metabolic panel     Status: Abnormal   Collection Time: 07/31/19  1:03 AM  Result Value Ref Range   Sodium 133 (L) 135 - 145 mmol/L   Potassium 4.5 3.5 - 5.1 mmol/L   Chloride 101 98 - 111 mmol/L   CO2 24 22 - 32 mmol/L   Glucose, Bld 152 (H) 70 - 99 mg/dL   BUN 7 6 - 20 mg/dL   Creatinine, Ser 0.66 0.61 - 1.24 mg/dL   Calcium 8.2 (L) 8.9 - 10.3 mg/dL   GFR calc non Af Amer >60 >60 mL/min   GFR calc Af Amer >60 >60 mL/min   Anion gap 8 5 - 15   Imaging: DG Chest Port 1 View  Result Date: 07/30/2019 CLINICAL DATA:  Chest tube placement. EXAM: PORTABLE CHEST 1 VIEW COMPARISON:  07/28/2019 FINDINGS: Two right chest tubes are in place. There is less pleural fluid on the right. No visible pleural air. Left chest remains clear. IMPRESSION: Two chest tubes placed on the right. Less pleural fluid presently. No pneumothorax. Electronically Signed   By: Nelson Chimes M.D.   On: 07/30/2019 14:03   Assessment/Plan:  Lin Glazier is a 54 yo M w/ a PMHx notable for GERD, anxiety, HTN, OCD, sciatica, and chronic opioid use disorder who presented with 5 weeks of cough and one week of chest pain and dyspnea admitted for further workup and management.   Assessment and Plan: CAP: Loculated effusion: Pleuritis: Patient presented with 5  weeks of cough and one week of chest pain and dyspnea. CT imaging in ED demonstrated CAP w/ loculated effusion. This was rapidly developing when comparing his CXR from 5/27 to admission. Thoracentesis performed and consistent with exudative process and no malignant cells on cytology. Consulted pulm who recommended treating with unasyn and consulting CT surg for VATS/bronchoscopy. Pt underwent these procedures yesterday. Bronch did not show a mass. Two chest tubes placed with significant improvement in patient's sx. Surgical path still pending.  -pain appears adequately treated with home morphine and dilaudid 1 mg q4 -CT surgery may take one chest tube out today and possibly anothre tomorrow with plan for quick discharge afterwards with oral antibiotis  Plan: -Continue unasyn -fu CT surgery recs  -fu surgical pathology -Continue home morphine (MS contin) 30mg  q8 hours -1 mg dilaudid q4 for breakthrough pain -duonebs q6 hours PRN for wheezing or  dyspnea -Flutter valve and incentive spirometry  -atarax prn for anxiety  Emphysema of the lung: Noted on Chest CT, which is in conjunction with his smoking history. May need alpha-one antitrypsin testing as an outpatient but will defer to PCP.   Plan: -Continue home Lama/Laba combo Anoro ellipta -Duonebs continued -F/up with PFT's as an outpatient  Chronic tobacco use: Recommended cessation. Patient is not ready to quit cold . Continue outpatient guidance.    Plan: Principal Problem:   Pneumonia Active Problems:   Chronic bilateral low back pain with sciatica   Tobacco use   Attention and concentration deficit   Chronic prescription opiate use   Emphysema of lung (HCC)   Pleural effusion on right  Dispo: Anticipated discharge in approximately 1-2 days.  [place], MD 07/31/2019, 10:04 AM Pager: [phone]

## 2019-07-31 NOTE — Op Note (Signed)
Procedure(s): VIDEO BRONCHOSCOPY VIDEO ASSISTED THORACOSCOPY (VATS)/DECORTICATION EMPYEMA DRAINAGE Procedure Note  Roberto Blair male 54 y.o. 07/31/2019  Procedure(s) and Anesthesia Type:    * VIDEO BRONCHOSCOPY - General    * VIDEO ASSISTED THORACOSCOPY (VATS)/DECORTICATION - General    * EMPYEMA DRAINAGE - General  Surgeon(s) and Role:    * Linden Dolin, MD - Primary   Indications: The patient was admitted to the hospital with a brief history of right-sided chest pain and difficulty breathing. Imaging studies revealed right loculated pleural effusion. The patient now presents for right VATS decortication after discussing therapeutic alternatives.        Surgeon: Linden Dolin   Assistants: Jaclyn Prime PA-C  Anesthesia: General endotracheal - Double lumen tube  ASA Class: 3    Procedure Detail  VIDEO BRONCHOSCOPY, VIDEO ASSISTED THORACOSCOPY (VATS)/DECORTICATION, EMPYEMA DRAINAGE The patient is taken to the operating room on the above date. He is placed in the supine position and anesthesia is begun. Then, he is placed in the left lateral decubitus position. The right chest is cleansed and draped sterilely. A preoperative pause is performed. Two incisions are made in the right chest allowing camera insertion and a working port incision. The right chest cavity is inspected thoracoscopically. A moderate amount of relatively free flowing fluid is evacuated and sent for culture and pathology as is a sample of pleural peel. Mechanical pleurodesis is undertaken. Two chest tubes are inserted in the right chest after completely evacuating contents and ensuring pulmonary re-expansion. Multilevel rib block is performed with Exparel solution.  Next, fiberoptic bronchoscopy was performed through a single lumen endotracheal tube of the distal trachea, its bifurcation and all airway divisions, specifically evaluating the right pulmonary branches for endotracheal lesions. No endotracheal  lesions were identified. This concluded the procedure; all sponge, instrument, and needle counts were correct.   Estimated Blood Loss:  Minimal         Drains: 2 right pleural tubes   Blood Given: none          Specimens: right pleural peel and right pleural fluid               Complications:  * No complications entered in OR log *         Disposition: PACU - hemodynamically stable.         Condition: stable

## 2019-07-31 NOTE — Plan of Care (Signed)

## 2019-07-31 NOTE — Progress Notes (Addendum)
      [address]       [place] [address]             [phone]      1 Day Post-Op Procedure(s) (LRB): VIDEO BRONCHOSCOPY (N/A) VIDEO ASSISTED THORACOSCOPY (VATS)/DECORTICATION (Right) EMPYEMA DRAINAGE (Right) Subjective: Having a lot of pain at the chest tube site. He does have less pain with deep breathing.   Objective: Vital signs in last 24 hours: Temp:  [97 F (36.1 C)-98.7 F (37.1 C)] 98 F (36.7 C) (06/03 0752) Pulse Rate:  [60-101] 76 (06/03 0752) Cardiac Rhythm: Normal sinus rhythm (06/03 0746) Resp:  [14-22] 16 (06/03 0752) BP: (117-141)/(53-77) 124/63 (06/03 0752) SpO2:  [91 %-98 %] 92 % (06/03 0752) Arterial Line BP: (161-165)/(68) 165/68 (06/02 1103)     Intake/Output from previous day: 06/02 0701 - 06/03 0700 In: 3476.5 [P.O.:480; I.V.:2596.5; IV Piggyback:400] Out: 1352 [Urine:1125; Blood:30; Chest Tube:197] Intake/Output this shift: Total I/O In: 237 [P.O.:237] Out: 0   General appearance: alert, cooperative and no distress Heart: regular rate and rhythm, S1, S2 normal, no murmur, click, rub or gallop Lungs: clear to auscultation bilaterally Abdomen: soft, non-tender; bowel sounds normal; no masses,  no organomegaly Extremities: extremities normal, atraumatic, no cyanosis or edema Wound: clean and dry around chest tubes  Lab Results: Recent Labs    07/30/19 0211 07/31/19 0103  WBC 10.9* 20.8*  HGB 11.7* 12.4*  HCT 35.3* 37.1*  PLT 292 322   BMET:  Recent Labs    07/29/19 2027 07/31/19 0103  NA 136 133*  K 4.7 4.5  CL 99 101  CO2 27 24  GLUCOSE 118* 152*  BUN 10 7  CREATININE 0.83 0.66  CALCIUM 8.3* 8.2*    PT/INR:  Recent Labs    07/29/19 2027  LABPROT 15.8*  INR 1.3*   ABG    Component Value Date/Time   PHART 7.445 07/29/2019 2000   HCO3 23.5 07/29/2019 2000   ACIDBASEDEF 0.0 07/29/2019 2000   O2SAT 91.0 07/29/2019 2000   CBG (last 3)  No results for input(s): GLUCAP in the last 72  hours.  Assessment/Plan: S/P Procedure(s) (LRB): VIDEO BRONCHOSCOPY (N/A) VIDEO ASSISTED THORACOSCOPY (VATS)/DECORTICATION (Right) EMPYEMA DRAINAGE (Right)  1. CXR stable today with two chest tubes in place. 200cc out of chest tubes overnight 2. NSR in the 70s, BP well controlled 3. Renal-creatinine 0.66, electrolytes okay 4. H and H 12.4/37.1, expected acute blood loss anemia 5. Endo-blood glucose is well controlled.  6. It appears that he did have a bronchoscopy intra-op  Plan: Nursing is planning on getting the patient up today and recording drainage from the chest tubes. If not much drainage then will consider removing one chest tube today. CXR for the morning. Hopefully can go home in the next few days.   Spoke with Dr. Lorre Nick anterior chest tube and CXR in the morning. Added back his home dose of gabapentin.    LOS: 4 days    Sharlene Dory 07/31/2019   Agree with PA notation. Camay Pedigo Z. Vickey Sages, MD [phone]

## 2019-07-31 NOTE — Progress Notes (Signed)
Patient ambulated 835ft around and outside of the unit, 2nd walk for the day. Patient tolerated very well.

## 2019-07-31 NOTE — Progress Notes (Signed)
1530: Anterior chest tube removed per order, patient tolerated well.

## 2019-07-31 NOTE — Evaluation (Signed)
Physical Therapy Evaluation Patient Details Name: Roberto Blair MRN: [id number] DOB: 1965/09/28 Today's Date: 07/31/2019   History of Present Illness  54 yo admitted with loculated Rt pleural effusion s/p VATS. 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 Pt with slight desaturation to 87% after stairs but able to maintain 90% with gait with standing rest breaks. Pt encouraged to continue to walk with observation of sats and to have pulse ox for home use. Pt educated for walking program and needed assist for lines acutely. No further acute therapy needs at this time with pt aware and agreeable.   Hr 76-84    Follow Up Recommendations No PT follow up    Equipment Recommendations  None recommended by PT    Recommendations for Other Services       Precautions / Restrictions        Mobility  Bed Mobility Overal bed mobility: Modified Independent                Transfers Overall transfer level: Modified independent                  Ambulation/Gait Ambulation/Gait assistance: Modified independent (Device/Increase time) Gait Distance (Feet): 500 Feet Assistive device: None Gait Pattern/deviations: WFL(Within Functional Limits)   Gait velocity interpretation: >2.62 ft/sec, indicative of community ambulatory General Gait Details: pt with good activity tolerance with monitoring O2 sats with 2 standing rest breaks during gait to maintain SpO2 >90%  Stairs Stairs: Yes Stairs assistance: Modified independent (Device/Increase time) Stair Management: Alternating pattern;Forwards Number of Stairs: 4 General stair comments: alternating ascending, step to descending. drop to 87% after stairs with standing rest to recover  Wheelchair Mobility    Modified Rankin (Stroke Patients Only)       Balance Overall balance assessment: No apparent  balance deficits (not formally assessed)                                           Pertinent Vitals/Pain Pain Assessment: 0-10 Pain Score: 8  Pain Location: chest at CT and back Pain Descriptors / Indicators: Aching;Discomfort;Constant Pain Intervention(s): Limited activity within patient's tolerance    Home Living Family/patient expects to be discharged to:: Private residence Living Arrangements: Spouse/significant other Available Help at Discharge: Family;Available 24 hours/day Type of Home: House Home Access: Stairs to enter Entrance Stairs-Rails: Doctor, general practice of Steps: 4 Home Layout: One level Home Equipment: None      Prior Function Level of Independence: Independent               Hand Dominance        Extremity/Trunk Assessment   Upper Extremity Assessment Upper Extremity Assessment: Overall WFL for tasks assessed    Lower Extremity Assessment Lower Extremity Assessment: Overall WFL for tasks assessed    Cervical / Trunk Assessment Cervical / Trunk Assessment: Normal  Communication   Communication: No difficulties  Cognition Arousal/Alertness: Awake/alert Behavior During Therapy: WFL for tasks assessed/performed Overall Cognitive Status: Within Functional Limits for tasks assessed                                        General Comments  Exercises     Assessment/Plan    PT Assessment Patent does not need any further PT services  PT Problem List         PT Treatment Interventions      PT Goals (Current goals can be found in the Care Plan section)  Acute Rehab PT Goals PT Goal Formulation: All assessment and education complete, DC therapy    Frequency     Barriers to discharge        Co-evaluation               AM-PAC PT "6 Clicks" Mobility  Outcome Measure Help needed turning from your back to your side while in a flat bed without using bedrails?: None Help needed  moving from lying on your back to sitting on the side of a flat bed without using bedrails?: None Help needed moving to and from a bed to a chair (including a wheelchair)?: None Help needed standing up from a chair using your arms (e.g., wheelchair or bedside chair)?: None Help needed to walk in hospital room?: None Help needed climbing 3-5 steps with a railing? : None 6 Click Score: 24    End of Session   Activity Tolerance: Patient tolerated treatment well Patient left: in chair;with nursing/sitter in room Nurse Communication: Mobility status PT Visit Diagnosis: Other abnormalities of gait and mobility (R26.89)    Time: 5035-4656 PT Time Calculation (min) (ACUTE ONLY): 29 min   Charges:   PT Evaluation $PT Eval Moderate Complexity: 1 Mod          Deziyah Arvin P, PT Acute Rehabilitation Services Pager: [phone] Office: [place] [facility] Tysin Salada 07/31/2019, 9:58 AM

## 2019-07-31 NOTE — Evaluation (Signed)
Occupational Therapy Evaluation Patient Details Name: Roberto Blair MRN: [id number] DOB: [birth year] Today's Date: 07/31/2019    History of Present Illness 54 yo admitted with loculated Rt pleural effusion s/p VATS. 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 At time of eval, pt presents with ability to complete transfers and mobility without device or external assist. No LOB or safety concerns noted. Pt on RA with VSS. Educated pt on pursed lip breathing, pacing, and prioritizing BADL routines for sufficient energy maintenance while healing. Wife present for education and supportive. Otherwise pt is independent and at baseline with BADLs. No further OT services indicated. OT will sign off, thank you for this consult.     Follow Up Recommendations  No OT follow up    Equipment Recommendations  None recommended by OT    Recommendations for Other Services       Precautions / Restrictions Precautions Precautions: None      Mobility Bed Mobility Overal bed mobility: Modified Independent                Transfers Overall transfer level: Modified independent                    Balance Overall balance assessment: No apparent balance deficits (not formally assessed)                                         ADL either performed or assessed with clinical judgement   ADL Overall ADL's : At baseline;Independent                                       General ADL Comments: Pt demonstrated ability to complete independent BADL. Pt was met in hallway, finishing walk with nursing without device, LOB or safety concerns. Pt able to complete toilet transfer, dressing, bathing without assist. Spent session educating pt on ECS strategies as it may apply to BADL/IADL routines upon d/c     Vision Patient Visual Report: No change from baseline        Perception     Praxis      Pertinent Vitals/Pain Pain Assessment: Faces Faces Pain Scale: Hurts little more Pain Location: chest at CT and back Pain Descriptors / Indicators: Aching;Discomfort;Constant Pain Intervention(s): Monitored during session     Hand Dominance     Extremity/Trunk Assessment Upper Extremity Assessment Upper Extremity Assessment: Overall WFL for tasks assessed   Lower Extremity Assessment Lower Extremity Assessment: Overall WFL for tasks assessed       Communication Communication Communication: No difficulties   Cognition Arousal/Alertness: Awake/alert Behavior During Therapy: WFL for tasks assessed/performed Overall Cognitive Status: Within Functional Limits for tasks assessed                                     General Comments  VSS on RA    Exercises     Shoulder Instructions      Home Living Family/patient expects to be discharged to:: Private residence Living Arrangements: Spouse/significant other Available Help at Discharge: Family;Available 24 hours/day Type of Home: House Home Access: Stairs to enter [employer] of Steps: 4  Entrance Stairs-Rails: Right;Left Home Layout: One level     Bathroom Shower/Tub: Teacher, early years/pre: Standard     Home Equipment: None          Prior Functioning/Environment Level of Independence: Independent                 OT Problem List: Decreased knowledge of use of DME or AE;Cardiopulmonary status limiting activity;Pain      OT Treatment/Interventions:      OT Goals(Current goals can be found in the care plan section) Acute Rehab OT Goals Patient 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 OT Goal Formulation: All assessment and education complete, DC therapy  OT Frequency:     Barriers to D/C:            Co-evaluation              AM-PAC OT "6 Clicks" Daily Activity     Outcome Measure Help from another person eating meals?:  None Help from another person taking care of personal grooming?: None Help from another person toileting, which includes using toliet, bedpan, or urinal?: None Help from another person bathing (including washing, rinsing, drying)?: None Help from another person to put on and taking off regular upper body clothing?: None Help from another person to put on and taking off regular lower body clothing?: None 6 Click Score: 24   End of Session Nurse Communication: Mobility status  Activity Tolerance: Patient tolerated treatment well Patient left: in bed;with family/visitor present  OT Visit Diagnosis: Other abnormalities of gait and mobility (R26.89);Pain Pain - part of body: (back, chest tube site)                Time: 3500-9381 OT Time Calculation (min): 10 min Charges:  OT General Charges $OT Visit: 1 Visit OT Evaluation $OT Eval Low Complexity: 1 Low  Zenovia Jarred, MSOT, OTR/L Acute Rehabilitation Services [facility] Office Number: [phone] Pager: [phone]  Zenovia Jarred 07/31/2019, 6:00 PM

## 2019-08-01 ENCOUNTER — Inpatient Hospital Stay (HOSPITAL_COMMUNITY): Payer: 59

## 2019-08-01 LAB — COMPREHENSIVE METABOLIC PANEL
ALT: 17 U/L (ref 0–44)
AST: 14 U/L — ABNORMAL LOW (ref 15–41)
Albumin: 2.3 g/dL — ABNORMAL LOW (ref 3.5–5.0)
Alkaline Phosphatase: 85 U/L (ref 38–126)
Anion gap: 9 (ref 5–15)
BUN: 5 mg/dL — ABNORMAL LOW (ref 6–20)
CO2: 28 mmol/L (ref 22–32)
Calcium: 8.2 mg/dL — ABNORMAL LOW (ref 8.9–10.3)
Chloride: 100 mmol/L (ref 98–111)
Creatinine, Ser: 0.71 mg/dL (ref 0.61–1.24)
GFR calc Af Amer: >60 mL/min (ref >60)
GFR calc non Af Amer: >60 mL/min (ref >60)
Glucose, Bld: 107 mg/dL — ABNORMAL HIGH (ref 70–99)
Potassium: 3.7 mmol/L (ref 3.5–5.1)
Sodium: 137 mmol/L (ref 135–145)
Total Bilirubin: 0.3 mg/dL (ref 0.3–1.2)
Total Protein: 6.1 g/dL — ABNORMAL LOW (ref 6.5–8.1)

## 2019-08-01 LAB — CBC
HCT: 38.2 % — ABNORMAL LOW (ref 39.0–52.0)
Hemoglobin: 12.4 g/dL — ABNORMAL LOW (ref 13.0–17.0)
MCH: 29.2 pg (ref 26.0–34.0)
MCHC: 32.5 g/dL (ref 30.0–36.0)
MCV: 89.9 fL (ref 80.0–100.0)
Platelets: 316 10*3/uL (ref 150–400)
RBC: 4.25 MIL/uL (ref 4.22–5.81)
RDW: 14 % (ref 11.5–15.5)
WBC: 11.3 10*3/uL — ABNORMAL HIGH (ref 4.0–10.5)
nRBC: 0 % (ref 0.0–0.2)

## 2019-08-01 MED ORDER — AMOXICILLIN-POT CLAVULANATE 875-125 MG PO TABS
1.0000 | ORAL_TABLET | Freq: Two times a day (BID) | ORAL | 0 refills | Status: AC
Start: 2019-08-01 — End: 2019-08-11

## 2019-08-01 MED ORDER — ENOXAPARIN SODIUM 40 MG/0.4ML ~~LOC~~ SOLN
40.0000 mg | SUBCUTANEOUS | Status: DC
  Administered 2019-08-01: 40 mg via SUBCUTANEOUS
  Filled 2019-08-01: qty 0.4

## 2019-08-01 MED FILL — AMOX-CLAV 875-125 MG TABLET: 875-125 | 10 days supply | Qty: 20 | Fill #0

## 2019-08-01 NOTE — Progress Notes (Signed)
[address]       Roberto Blair [address]             [phone]      2 Days Post-Op Procedure(s) (LRB): VIDEO BRONCHOSCOPY (N/A) VIDEO ASSISTED THORACOSCOPY (VATS)/DECORTICATION (Right) EMPYEMA DRAINAGE (Right) Subjective: Feels pretty well, no specific c/o  Objective: Vital signs in last 24 hours: Temp:  [97.7 F (36.5 C)-98.8 F (37.1 C)] 98.8 F (37.1 C) (06/04 0739) Pulse Rate:  [62-86] 86 (06/04 0739) Cardiac Rhythm: Normal sinus rhythm (06/04 0721) Resp:  [16-24] 17 (06/04 0739) BP: (111-160)/(62-73) 160/73 (06/04 0739) SpO2:  [96 %-100 %] 99 % (06/04 0739)  Hemodynamic parameters for last 24 hours:    Intake/Output from previous day: 06/03 0701 - 06/04 0700 In: 3409.7 [P.O.:1187; I.V.:2022.7; IV Piggyback:200] Out: 1270 [Urine:1250; Chest Tube:20] Intake/Output this shift: No intake/output data recorded.  General appearance: alert, cooperative and no distress Heart: regular rate and rhythm Lungs: dim right lower fields Abdomen: soft, non tender Extremities: no edema or calf tenderness Wound: incis ok  Lab Results: Recent Labs    07/31/19 0103 08/01/19 0232  WBC 20.8* 11.3*  HGB 12.4* 12.4*  HCT 37.1* 38.2*  PLT 322 316   BMET:  Recent Labs    07/31/19 0103 08/01/19 0232  NA 133* 137  K 4.5 3.7  CL 101 100  CO2 24 28  GLUCOSE 152* 107*  BUN 7 5*  CREATININE 0.66 0.71  CALCIUM 8.2* 8.2*    PT/INR:  Recent Labs    07/29/19 2027  LABPROT 15.8*  INR 1.3*   ABG    Component Value Date/Time   PHART 7.445 07/29/2019 2000   HCO3 23.5 07/29/2019 2000   ACIDBASEDEF 0.0 07/29/2019 2000   O2SAT 91.0 07/29/2019 2000   CBG (last 3)  No results for input(s): GLUCAP in the last 72 hours.  Meds Scheduled Meds: . acetaminophen  1,000 mg Oral Q6H   Or  . acetaminophen (TYLENOL) oral liquid 160 mg/5 mL  1,000 mg Oral Q6H  . bisacodyl  10 mg Oral Daily  . enoxaparin (LOVENOX) injection  40 mg Subcutaneous Q24H  .  gabapentin  800 mg Oral TID  . morphine  30 mg Oral Q8H  . QUEtiapine  200 mg Oral QHS  . senna-docusate  1 tablet Oral QHS   Continuous Infusions: . sodium chloride 100 mL/hr at 07/31/19 2324  . ampicillin-sulbactam (UNASYN) IV 3 g (08/01/19 1000)   PRN Meds:.carisoprodol, HYDROmorphone (DILAUDID) injection, ondansetron (ZOFRAN) IV  Xrays DG Chest Port 1 View  Result Date: 08/01/2019 CLINICAL DATA:  Chest tube, pneumonia. EXAM: PORTABLE CHEST 1 VIEW COMPARISON:  07/31/2019 chest radiograph. FINDINGS: Interval removal of right basilar chest tube. Right chest tube terminating over the upper to mid lung is unchanged. New tiny right apical pneumothorax. Mildly increased right and unchanged left basilar opacities are unchanged. Small right pleural effusion. Partially obscured cardiomediastinal silhouette. IMPRESSION: Interval removal of right basilar chest tube, remaining right chest tube is unchanged. New tiny right apical pneumothorax. Mildly increased right basilar opacities. Small right pleural effusion and left basilar opacities are unchanged. These results will be called to the ordering clinician or representative by the Radiologist Assistant, and communication documented in the PACS or [employer]. Electronically Signed   By: Stana Bunting M.D.   On: 08/01/2019 09:28   DG CHEST PORT 1 VIEW  Result Date: 07/31/2019 CLINICAL DATA:  Chest tube. EXAM: PORTABLE CHEST 1 VIEW COMPARISON:  07/30/2019 FINDINGS:  Cardiomediastinal contours are stable. Hilar structures are unremarkable. Increasing opacity at the LEFT lung base with stable ill-defined opacity at the RIGHT lung base associated with 2 RIGHT-sided chest tubes. Accounting for differences in projection the more cephalad oriented chest tube may have been retracted slightly since the previous exam. No visible pneumothorax. IMPRESSION: Increasing opacity at the LEFT lung base may represent atelectasis or developing infection. Stable  ill-defined opacity at the RIGHT lung base associated with 2 RIGHT-sided chest tubes. No visible pneumothorax. Electronically Signed   By: Donzetta Kohut M.D.   On: 07/31/2019 10:07   DG Chest Port 1 View  Result Date: 07/30/2019 CLINICAL DATA:  Chest tube placement. EXAM: PORTABLE CHEST 1 VIEW COMPARISON:  07/28/2019 FINDINGS: Two right chest tubes are in place. There is less pleural fluid on the right. No visible pleural air. Left chest remains clear. IMPRESSION: Two chest tubes placed on the right. Less pleural fluid presently. No pneumothorax. Electronically Signed   By: Paulina Fusi M.D.   On: 07/30/2019 14:03    Results for orders placed or performed during the hospital encounter of 07/27/19  SARS Coronavirus 2 by RT PCR (hospital order, performed in [facility] hospital lab) Nasopharyngeal Nasopharyngeal Swab     Status: None   Collection Time: 07/27/19  3:35 PM   Specimen: Nasopharyngeal Swab  Result Value Ref Range Status   SARS Coronavirus 2 NEGATIVE NEGATIVE Final    Comment: (NOTE) SARS-CoV-2 target nucleic acids are NOT DETECTED. The SARS-CoV-2 RNA is generally detectable in upper and lower respiratory specimens during the acute phase of infection. The lowest concentration of SARS-CoV-2 viral copies this assay can detect is 250 copies / mL. A negative result does not preclude SARS-CoV-2 infection and should not be used as the sole basis for treatment or other patient management decisions.  A negative result may occur with improper specimen collection / handling, submission of specimen other than nasopharyngeal swab, presence of viral mutation(s) within the areas targeted by this assay, and inadequate number of viral copies (<250 copies / mL). A negative result must be combined with clinical observations, patient history, and epidemiological information. Fact Sheet for Patients:   BoilerBrush.com.cy Fact Sheet for Healthcare  Providers: https://pope.com/ This test is not yet approved or cleared  by the [place] FDA and has been authorized for detection and/or diagnosis of SARS-CoV-2 by FDA under an Emergency Use Authorization (EUA).  This EUA will remain in effect (meaning this test can be used) for the duration of the COVID-19 declaration under Section 564(b)(1) of the Act, 21 U.S.C. section 360bbb-3(b)(1), unless the authorization is terminated or revoked sooner. Performed at [facility], [address], [place], [place] [address]   Fungus Culture With Stain     Status: None (Preliminary result)   Collection Time: 07/28/19  1:18 PM   Specimen: PATH Cytology Pleural fluid  Result Value Ref Range Status   Fungus Stain Final report  Final    Comment: (NOTE) Performed At: [facility] [address] [place], [place] [id number] Jolene Schimke MD OR:[id number]    Fungus (Mycology) Culture PENDING  Incomplete   Fungal Source PLEURAL  Final    Comment: FLUID  Acid Fast Smear (AFB)     Status: None   Collection Time: 07/28/19  1:18 PM   Specimen: PATH Cytology Pleural fluid  Result Value Ref Range Status   AFB Specimen Processing Concentration  Final   Acid Fast Smear Negative  Final    Comment: (NOTE)  Performed At: [facility] [address] [place], [place] [id number] Jolene Schimke MD CV:[id number]    Source (AFB) PLEURAL  Final    Comment: FLUID  Gram stain     Status: None   Collection Time: 07/28/19  1:18 PM   Specimen: Pleura  Result Value Ref Range Status   Specimen Description PLEURAL FLUID  Final   Special Requests NONE  Final   Gram Stain   Final    CYTOSPIN SMEAR ABUNDANT WBC PRESENT, PREDOMINANTLY PMN NO ORGANISMS SEEN Performed at [facility], [address], [place], [place] [address]    Report Status 07/28/2019 FINAL  Final  Culture, body fluid-bottle     Status: None (Preliminary result)   Collection Time: 07/28/19   1:18 PM   Specimen: Pleura  Result Value Ref Range Status   Specimen Description PLEURAL FLUID  Final   Special Requests NONE  Final   Culture   Final    NO GROWTH 3 DAYS Performed at [facility], [address], [place], [place] [address]    Report Status PENDING  Incomplete  Fungus Culture Result     Status: None   Collection Time: 07/28/19  1:18 PM  Result Value Ref Range Status   Result 1 Comment  Final    Comment: (NOTE) KOH/Calcofluor preparation:  no fungus observed. Performed At: [facility] [address] [place], [place] [id number] Jolene Schimke MD RX:[id number]   Expectorated sputum assessment w rflx to resp cult     Status: None   Collection Time: 07/28/19  4:59 PM   Specimen: Expectorated Sputum  Result Value Ref Range Status   Specimen Description EXPECTORATED SPUTUM  Final   Special Requests NONE  Final   Sputum evaluation   Final    THIS SPECIMEN IS ACCEPTABLE FOR SPUTUM CULTURE Performed at [facility], [address], [place], [place] [address]    Report Status 07/28/2019 FINAL  Final  Culture, respiratory     Status: None   Collection Time: 07/28/19  4:59 PM  Result Value Ref Range Status   Specimen Description EXPECTORATED SPUTUM  Final   Special Requests NONE Reflexed from [id number]  Final   Gram Stain   Final    ABUNDANT WBC PRESENT, PREDOMINANTLY PMN MODERATE GRAM POSITIVE COCCI FEW YEAST RARE GRAM NEGATIVE RODS    Culture   Final    Consistent with normal respiratory flora. Performed at [facility], [address] [place], [place] [address]    Report Status 07/30/2019 FINAL  Final  MRSA PCR Screening     Status: None   Collection Time: 07/29/19  9:59 PM   Specimen: Nasal Mucosa; Nasopharyngeal  Result Value Ref Range Status   MRSA by PCR NEGATIVE NEGATIVE Final    Comment:        The GeneXpert MRSA Assay (FDA approved for NASAL specimens only), is one component of a comprehensive MRSA  colonization surveillance program. It is not intended to diagnose MRSA infection nor to guide or monitor treatment for MRSA infections. Performed at [facility], [address] [place], [place] [address]   Aerobic/Anaerobic Culture (surgical/deep wound)     Status: None (Preliminary result)   Collection Time: 07/30/19  9:18 AM   Specimen: PATH Cytology Pleural fluid; Body Fluid  Result Value Ref Range Status   Specimen Description PLEURAL FLUID RIGHT  Final   Special Requests RIGHT LOCULATED EFFUSION  Final   Gram Stain  Final    RARE WBC PRESENT, PREDOMINANTLY PMN NO ORGANISMS SEEN    Culture   Final    NO GROWTH 2 DAYS Performed at [place] [address], [place], [place] [address]    Report Status PENDING  Incomplete    Assessment/Plan: S/P Procedure(s) (LRB): VIDEO BRONCHOSCOPY (N/A) VIDEO ASSISTED THORACOSCOPY (VATS)/DECORTICATION (Right) EMPYEMA DRAINAGE (Right)  1 afeb, VSS, some HTB 2 sats good on RA 3 CT- only 20 cc of drainage 4 CXR- tiny apical pntx<5%- will d/c other tube today 5 leukocytosis improved- conts unasyn, sensitivities pending 6 H/H stable 7 normal renal fxn 8 f/u CXR after tube removal- if stable than can be discharged when ok with medicine service   LOS: 5 days    John Giovanni PA-C Pager [phone] 08/01/2019

## 2019-08-01 NOTE — Progress Notes (Signed)
IV discontinued.  Discharge instructions reviewed with patient and spouse.  Both verbalize understanding of the instructions and were given a written copy of the instructions.  Patient via wheelchair to wife's waiting car

## 2019-08-01 NOTE — Discharge Summary (Signed)
Name: Roberto ChengMark M Blair MRN: [id number] DOB: March 03, 1965 54 y.o. PCP: Albertha GheeSteen, James, MD  Date of Admission: 07/27/2019  2:43 PM Date of Discharge:  08/02/19 Attending Physician: No att. providers found  Discharge Diagnosis:  1. Community Acquired Pneumonia 2. Loculated Pleural Effusion 3. Emphysema 4. Chronic tobacco use  Discharge Medications: Allergies as of 08/01/2019      Reactions   Aspirin Other (See Comments)   Causes ulcers   Nsaids Other (See Comments)   Causes ulcers      Medication List    STOP taking these medications   predniSONE 10 MG (21) Tbpk tablet Commonly known as: STERAPRED UNI-PAK 21 TAB     TAKE these medications   albuterol 108 (90 Base) MCG/ACT inhaler Commonly known as: VENTOLIN HFA Inhale 2 puffs into the lungs every 6 (six) hours as needed for wheezing or shortness of breath.   amoxicillin-clavulanate 875-125 MG tablet Commonly known as: Augmentin Take 1 tablet by mouth 2 (two) times daily for 10 days.   amphetamine-dextroamphetamine 20 MG tablet Commonly known as: ADDERALL Take 1 tablet (20 mg total) by mouth 2 (two) times daily. What changed:   when to take this  additional instructions   Anoro Ellipta 62.5-25 MCG/INH Aepb Generic drug: umeclidinium-vilanterol Inhale 1 puff into the lungs daily as needed (shortness of breath).   benzonatate 100 MG capsule Commonly known as: Tessalon Perles Take 1 capsule (100 mg total) by mouth every 6 (six) hours as needed for cough.   buPROPion 150 MG 24 hr tablet Commonly known as: Wellbutrin XL Take 1 tablet (150 mg total) by mouth daily.   carisoprodol 350 MG tablet Commonly known as: Soma Take 1 tablet (350 mg total) by mouth 3 (three) times daily as needed for muscle spasms. What changed: when to take this   chlorpheniramine-HYDROcodone 10-8 MG/5ML Suer Commonly known as: Tussionex Pennkinetic ER Take 5 mLs by mouth every 12 (twelve) hours as needed for cough.   cloNIDine 0.1 MG  tablet Commonly known as: CATAPRES Take 0.2 mg by mouth at bedtime. What changed: Another medication with the same name was removed. Continue taking this medication, and follow the directions you see here.   gabapentin 800 MG tablet Commonly known as: NEURONTIN Take 1 tablet (800 mg total) by mouth 4 (four) times daily. What changed: when to take this   Morphine Sulfate ER 30 MG T12a Take 30 mg by mouth 3 (three) times daily as needed. What changed: when to take this   pantoprazole 20 MG tablet Commonly known as: PROTONIX Take 1 tablet (20 mg total) by mouth daily.   QUEtiapine 100 MG tablet Commonly known as: SEROquel Take 2 tablets (200 mg total) by mouth at bedtime.            Discharge Care Instructions  (From admission, onward)         Start     Ordered   08/01/19 0000  If the dressing is still on your incision site when you go home, remove it on the third day after your surgery date. Remove dressing if it begins to fall off, or if it is dirty or damaged before the third day.     08/01/19 1145          Disposition and follow-up:   Roberto Blair [facility].  At the hospital follow up visit please address:   1.  Please ensure continued resolution of shortness of breath  and chest pain. Please ensure chest tube incision sites are healing well. Please address findings of emphysema on CT. May need PFTs and alpha-1 antitrypsin testing. Please address tobacco use and need for cessation.  2.  Labs / imaging needed at time of follow-up: na  3.  Pending labs/ test needing follow-up: na  Follow-up Appointments: Follow-up Information    Albertha Ghee, MD. Schedule an appointment as soon as possible for a visit in 1 week(s).   Specialty: Internal Medicine Contact information: [address] [place] [address] [phone]        Linden Dolin, MD Follow up on 08/11/2019.   Specialty:  Cardiothoracic Surgery Why: Appointment is at 1:30 Contact information: [address] [place] [address] [place] [address] [phone]           Hospital Course by problem list:  1. Community Acquired Pneumonia 2. Loculated Pleural Effusion Patient presented with 5 weeks of cough and one week of chest pain and dyspnea. CT imaging in ED demonstrated CAP w/ loculated effusion. This was rapidly developing when comparing his CXR from 5/27 to admission. Thoracentesis performed and consistent with exudative process and no malignant cells on cytology. Consulted pulm who recommended treating with unasyn and consulting CT surg for VATS/bronchoscopy. Pt underwent these procedures yesterday. Bronch did not show a mass. Surgical path negative for malignancy. Two chest tubes placed with significant improvement in patient's sx. Both chest tubes removed without reaccumulation of fluid. Sending home on augmentin.  3. Emphysema Noted on Chest CT, which is consistent with his smoking history. May need alpha-one antitrypsin testing as an outpatient but will defer to PCP. F/up with PFT's as an outpatient  4. Chronic tobacco use Recommended cessation. Patient is not ready to quit cold [place]. Continue outpatient guidance.  Discharge Vitals:   BP (!) 145/78 (BP Location: Right Arm)    Pulse 84    Temp 98.3 F (36.8 C) (Oral)    Resp 18    Ht 6' 0.1" (1.831 m)    SpO2 98%    BMI 28.81 kg/m   Pertinent Labs, Studies, and Procedures:  Results for orders placed or performed during the hospital encounter of 07/27/19 (from the past 168 hour(s))  Basic metabolic panel   Collection Time: 07/27/19  2:58 PM  Result Value Ref Range   Sodium 135 135 - 145 mmol/L   Potassium 4.2 3.5 - 5.1 mmol/L   Chloride 101 98 - 111 mmol/L   CO2 22 22 - 32 mmol/L   Glucose, Bld 143 (H) 70 - 99 mg/dL   BUN 9 6 - 20 mg/dL   Creatinine, Ser 5.78 0.61 - 1.24 mg/dL   Calcium 8.6 (L) 8.9 - 10.3 mg/dL   GFR calc non Af Amer >60  >60 mL/min   GFR calc Af Amer >60 >60 mL/min   Anion gap 12 5 - 15  CBC   Collection Time: 07/27/19  2:58 PM  Result Value Ref Range   WBC 16.0 (H) 4.0 - 10.5 K/uL   RBC 4.54 4.22 - 5.81 MIL/uL   Hemoglobin 13.4 13.0 - 17.0 g/dL   HCT 46.9 62.9 - 52.8 %   MCV 88.5 80.0 - 100.0 fL   MCH 29.5 26.0 - 34.0 pg   MCHC 33.3 30.0 - 36.0 g/dL   RDW 41.3 24.4 - 01.0 %   Platelets 341 150 - 400 K/uL   nRBC 0.0 0.0 - 0.2 %  D-dimer, quantitative (not at [facility])  Collection Time: 07/27/19  2:58 PM  Result Value Ref Range   D-Dimer, Quant 1.83 (H) 0.00 - 0.50 ug/mL-FEU  CK   Collection Time: 07/27/19  2:58 PM  Result Value Ref Range   Total CK 18 (L) 49 - 397 U/L  Troponin I (High Sensitivity)   Collection Time: 07/27/19  2:58 PM  Result Value Ref Range   Troponin I (High Sensitivity) 3 <18 ng/L  SARS Coronavirus 2 by RT PCR (hospital order, performed in [facility] hospital lab) Nasopharyngeal Nasopharyngeal Swab   Collection Time: 07/27/19  3:35 PM   Specimen: Nasopharyngeal Swab  Result Value Ref Range   SARS Coronavirus 2 NEGATIVE NEGATIVE  CBC   Collection Time: 07/28/19  4:32 AM  Result Value Ref Range   WBC 13.0 (H) 4.0 - 10.5 K/uL   RBC 4.22 4.22 - 5.81 MIL/uL   Hemoglobin 12.4 (L) 13.0 - 17.0 g/dL   HCT 60.4 (L) 54.0 - 98.1 %   MCV 89.3 80.0 - 100.0 fL   MCH 29.4 26.0 - 34.0 pg   MCHC 32.9 30.0 - 36.0 g/dL   RDW 19.1 47.8 - 29.5 %   Platelets 284 150 - 400 K/uL   nRBC 0.0 0.0 - 0.2 %  Comprehensive metabolic panel   Collection Time: 07/28/19  4:32 AM  Result Value Ref Range   Sodium 134 (L) 135 - 145 mmol/L   Potassium 3.9 3.5 - 5.1 mmol/L   Chloride 98 98 - 111 mmol/L   CO2 27 22 - 32 mmol/L   Glucose, Bld 105 (H) 70 - 99 mg/dL   BUN 8 6 - 20 mg/dL   Creatinine, Ser 6.21 0.61 - 1.24 mg/dL   Calcium 8.5 (L) 8.9 - 10.3 mg/dL   Total Protein 6.6 6.5 - 8.1 g/dL   Albumin 2.5 (L) 3.5 - 5.0 g/dL   AST 20 15 - 41 U/L   ALT 29 0 - 44 U/L   Alkaline Phosphatase 115 38 -  126 U/L   Total Bilirubin 0.8 0.3 - 1.2 mg/dL   GFR calc non Af Amer >60 >60 mL/min   GFR calc Af Amer >60 >60 mL/min   Anion gap 9 5 - 15  HIV Antibody (routine testing w rflx)   Collection Time: 07/28/19  4:32 AM  Result Value Ref Range   HIV Screen 4th Generation wRfx Non Reactive Non Reactive  Lactate dehydrogenase   Collection Time: 07/28/19 10:58 AM  Result Value Ref Range   LDH 97 (L) 98 - 192 U/L  Protein, total   Collection Time: 07/28/19 10:58 AM  Result Value Ref Range   Total Protein 7.0 6.5 - 8.1 g/dL  Fungus Culture With Stain   Collection Time: 07/28/19  1:18 PM   Specimen: PATH Cytology Pleural fluid  Result Value Ref Range   Fungus Stain Final report    Fungus (Mycology) Culture PENDING    Fungal Source PLEURAL   Acid Fast Smear (AFB)   Collection Time: 07/28/19  1:18 PM   Specimen: PATH Cytology Pleural fluid  Result Value Ref Range   AFB Specimen Processing Concentration    Acid Fast Smear Negative    Source (AFB) PLEURAL   Gram stain   Collection Time: 07/28/19  1:18 PM   Specimen: Pleura  Result Value Ref Range   Specimen Description PLEURAL FLUID    Special Requests NONE    Gram Stain      CYTOSPIN SMEAR ABUNDANT WBC PRESENT, PREDOMINANTLY PMN NO ORGANISMS  SEEN Performed at [facility], [address], [place], [place] [address]    Report Status 07/28/2019 FINAL   Culture, body fluid-bottle   Collection Time: 07/28/19  1:18 PM   Specimen: Pleura  Result Value Ref Range   Specimen Description PLEURAL FLUID    Special Requests NONE    Culture      NO GROWTH 4 DAYS Performed at [facility], [address], [place], [place] [address]    Report Status PENDING   Fungus Culture Result   Collection Time: 07/28/19  1:18 PM  Result Value Ref Range   Result 1 Comment   Lactate dehydrogenase (pleural or peritoneal fluid)   Collection Time: 07/28/19  1:18 PM  Result Value Ref Range   LD, Fluid 335 (H) 3 - 23 U/L   Fluid Type-FLDH  CYTO PLEU RIGHT   Body fluid cell count with differential   Collection Time: 07/28/19  1:18 PM  Result Value Ref Range   Fluid Type-FCT CYTO PLEU RIGHT    Color, Fluid YELLOW    Appearance, Fluid CLOUDY (A) CLEAR   Total Nucleated Cell Count, Fluid 105,000 (H) 0 - 1,000 cu mm   Neutrophil Count, Fluid 69 (H) 0 - 25 %   Lymphs, Fluid 7 %   Monocyte-Macrophage-Serous Fluid 23 (L) 50 - 90 %   Eos, Fluid 1 %   Other Cells, Fluid MESOTHELIAL CELLS PRESENT %  Albumin, pleural or peritoneal fluid   Collection Time: 07/28/19  1:18 PM  Result Value Ref Range   Albumin, Fluid 2.1 g/dL   Fluid Type-FALB CYTO PLEU RIGHT   Protein, pleural or peritoneal fluid   Collection Time: 07/28/19  1:18 PM  Result Value Ref Range   Total protein, fluid 4.7 g/dL   Fluid Type-FTP CYTO PLEU RIGHT   Glucose, pleural or peritoneal fluid   Collection Time: 07/28/19  1:18 PM  Result Value Ref Range   Glucose, Fluid 106 mg/dL   Fluid Type-FGLU CYTO PLEU RIGHT   Cytology - Non PAP;   Collection Time: 07/28/19  1:18 PM  Result Value Ref Range   CYTOLOGY - NON GYN      CYTOLOGY - NON PAP CASE: [id number] PATIENT: Roberto Blair Non-Gynecological Cytology Report     Clinical History: None provided Specimen Submitted:  A. PLEURAL FLUID, RIGHT, THORACENTESIS:   FINAL MICROSCOPIC DIAGNOSIS: - No malignant cells identified  SPECIMEN ADEQUACY: Satisfactory for evaluation  DIAGNOSTIC COMMENTS: Severe inflammation is present.  GROSS: Received is/are: 100cc's of red fluid. (KJ:[id number]) Smears: 0 Concentration Method (ThinPrep): 1 Cell Block: 1 Conventional Additional Studies: N/A     Final Diagnosis performed by Holley Bouche, MD.   Electronically signed 07/30/2019 Technical component performed at [employer], [address] [place], [place] [address].  Professional component performed at [facility], [address] [place], [place] [address].   Immunohistochemistry Technical component (if applicable) was performed at [facility]. [address], [address], [place], [place] [address].   IMMUNOHISTOCHEMISTRY DISCLAIMER (if applicable): Some of these immunohistochemical stains may have been developed and the performance characteristics determine by [facility]. Some may not have been cleared or approved by the U.S. Food and Drug Administration. The FDA has determined that such clearance or approval is not necessary. This test is used for clinical purposes. It should not be regarded as investigational or for research. This laboratory is certified under the Clinical Laboratory Improvement Amendments of 1988 (  CLIA-88) as qualified to perform high complexity clinical laboratory testing.  The controls stained appropriately.   Procalcitonin - Baseline   Collection Time: 07/28/19  4:47 PM  Result Value Ref Range   Procalcitonin <0.10 ng/mL  Expectorated sputum assessment w rflx to resp cult   Collection Time: 07/28/19  4:59 PM   Specimen: Expectorated Sputum  Result Value Ref Range   Specimen Description EXPECTORATED SPUTUM    Special Requests NONE    Sputum evaluation      THIS SPECIMEN IS ACCEPTABLE FOR SPUTUM CULTURE Performed at [facility], [address], [place], [place] [address]    Report Status 07/28/2019 FINAL   Culture, respiratory   Collection Time: 07/28/19  4:59 PM  Result Value Ref Range   Specimen Description EXPECTORATED SPUTUM    Special Requests NONE Reflexed from [id number]    Gram Stain      ABUNDANT WBC PRESENT, PREDOMINANTLY PMN MODERATE GRAM POSITIVE COCCI FEW YEAST RARE GRAM NEGATIVE RODS    Culture      Consistent with normal respiratory flora. Performed at [facility], [address] [place], [place] [address]    Report Status 07/30/2019 FINAL   CBC with Differential/Platelet   Collection Time: 07/29/19  6:42 AM  Result Value Ref Range   WBC 11.6 (H)  4.0 - 10.5 K/uL   RBC 4.15 (L) 4.22 - 5.81 MIL/uL   Hemoglobin 12.3 (L) 13.0 - 17.0 g/dL   HCT 09.8 (L) 11.9 - 14.7 %   MCV 88.2 80.0 - 100.0 fL   MCH 29.6 26.0 - 34.0 pg   MCHC 33.6 30.0 - 36.0 g/dL   RDW 82.9 56.2 - 13.0 %   Platelets 316 150 - 400 K/uL   nRBC 0.0 0.0 - 0.2 %   Neutrophils Relative % 73 %   Neutro Abs 8.4 (H) 1.7 - 7.7 K/uL   Lymphocytes Relative 15 %   Lymphs Abs 1.8 0.7 - 4.0 K/uL   Monocytes Relative 9 %   Monocytes Absolute 1.0 0.1 - 1.0 K/uL   Eosinophils Relative 2 %   Eosinophils Absolute 0.3 0.0 - 0.5 K/uL   Basophils Relative 0 %   Basophils Absolute 0.1 0.0 - 0.1 K/uL   Immature Granulocytes 1 %   Abs Immature Granulocytes 0.07 0.00 - 0.07 K/uL  Urinalysis, Routine w reflex microscopic   Collection Time: 07/29/19  7:24 PM  Result Value Ref Range   Color, Urine YELLOW YELLOW   APPearance CLEAR CLEAR   Specific Gravity, Urine 1.025 1.005 - 1.030   pH 5.0 5.0 - 8.0   Glucose, UA NEGATIVE NEGATIVE mg/dL   Hgb urine dipstick NEGATIVE NEGATIVE   Bilirubin Urine NEGATIVE NEGATIVE   Ketones, ur NEGATIVE NEGATIVE mg/dL   Protein, ur NEGATIVE NEGATIVE mg/dL   Nitrite NEGATIVE NEGATIVE   Leukocytes,Ua TRACE (A) NEGATIVE   RBC / HPF 0-5 0 - 5 RBC/hpf   WBC, UA 0-5 0 - 5 WBC/hpf   Bacteria, UA RARE (A) NONE SEEN   Mucus PRESENT   Blood gas, arterial on room air   Collection Time: 07/29/19  8:00 PM  Result Value Ref Range   FIO2 21.00    pH, Arterial 7.445 7.350 - 7.450   pCO2 arterial 34.9 32.0 - 48.0 mmHg   pO2, Arterial 62.2 (L) 83.0 - 108.0 mmHg   Bicarbonate 23.5 20.0 - 28.0 mmol/L   Acid-base deficit 0.0 0.0 - 2.0 mmol/L   O2 Saturation 91.0 %  Patient temperature 37.3    Collection site RIGHT RADIAL    Drawn by [phone]    Sample type ARTERIAL    Allens test (pass/fail) PASS PASS  CBC   Collection Time: 07/29/19  8:27 PM  Result Value Ref Range   WBC 11.1 (H) 4.0 - 10.5 K/uL   RBC 4.28 4.22 - 5.81 MIL/uL   Hemoglobin 12.6 (L) 13.0 - 17.0  g/dL   HCT 60.4 (L) 54.0 - 98.1 %   MCV 89.0 80.0 - 100.0 fL   MCH 29.4 26.0 - 34.0 pg   MCHC 33.1 30.0 - 36.0 g/dL   RDW 19.1 47.8 - 29.5 %   Platelets 303 150 - 400 K/uL   nRBC 0.0 0.0 - 0.2 %  Comprehensive metabolic panel   Collection Time: 07/29/19  8:27 PM  Result Value Ref Range   Sodium 136 135 - 145 mmol/L   Potassium 4.7 3.5 - 5.1 mmol/L   Chloride 99 98 - 111 mmol/L   CO2 27 22 - 32 mmol/L   Glucose, Bld 118 (H) 70 - 99 mg/dL   BUN 10 6 - 20 mg/dL   Creatinine, Ser 6.21 0.61 - 1.24 mg/dL   Calcium 8.3 (L) 8.9 - 10.3 mg/dL   Total Protein 6.6 6.5 - 8.1 g/dL   Albumin 2.4 (L) 3.5 - 5.0 g/dL   AST 15 15 - 41 U/L   ALT 27 0 - 44 U/L   Alkaline Phosphatase 108 38 - 126 U/L   Total Bilirubin 0.4 0.3 - 1.2 mg/dL   GFR calc non Af Amer >60 >60 mL/min   GFR calc Af Amer >60 >60 mL/min   Anion gap 10 5 - 15  Protime-INR   Collection Time: 07/29/19  8:27 PM  Result Value Ref Range   Prothrombin Time 15.8 (H) 11.4 - 15.2 seconds   INR 1.3 (H) 0.8 - 1.2  APTT   Collection Time: 07/29/19  8:27 PM  Result Value Ref Range   aPTT 43 (H) 24 - 36 seconds  Type and screen [facility]   Collection Time: 07/29/19  8:27 PM  Result Value Ref Range   ABO/RH(D) O POS    Antibody Screen NEG    Sample Expiration      08/01/2019,2359 Performed at [facility], [address] [place], [place] [address]   ABO/Rh   Collection Time: 07/29/19  8:27 PM  Result Value Ref Range   ABO/RH(D)      O POS Performed at [facility], [address] [place], [place] [address]   MRSA PCR Screening   Collection Time: 07/29/19  9:59 PM   Specimen: Nasal Mucosa; Nasopharyngeal  Result Value Ref Range   MRSA by PCR NEGATIVE NEGATIVE  CBC with Differential/Platelet   Collection Time: 07/30/19  2:11 AM  Result Value Ref Range   WBC 10.9 (H) 4.0 - 10.5 K/uL   RBC 3.98 (L) 4.22 - 5.81 MIL/uL   Hemoglobin 11.7 (L) 13.0 - 17.0 g/dL   HCT 62.9 (L) 52.8 - 41.3 %   MCV  88.7 80.0 - 100.0 fL   MCH 29.4 26.0 - 34.0 pg   MCHC 33.1 30.0 - 36.0 g/dL   RDW 24.4 01.0 - 27.2 %   Platelets 292 150 - 400 K/uL   nRBC 0.0 0.0 - 0.2 %   Neutrophils Relative % 67 %   Neutro Abs 7.4 1.7 - 7.7 K/uL   Lymphocytes Relative 19 %  Lymphs Abs 2.1 0.7 - 4.0 K/uL   Monocytes Relative 9 %   Monocytes Absolute 1.0 0.1 - 1.0 K/uL   Eosinophils Relative 3 %   Eosinophils Absolute 0.3 0.0 - 0.5 K/uL   Basophils Relative 1 %   Basophils Absolute 0.1 0.0 - 0.1 K/uL   Immature Granulocytes 1 %   Abs Immature Granulocytes 0.09 (H) 0.00 - 0.07 K/uL  Aerobic/Anaerobic Culture (surgical/deep wound)   Collection Time: 07/30/19  9:18 AM   Specimen: PATH Cytology Pleural fluid; Body Fluid  Result Value Ref Range   Specimen Description PLEURAL FLUID RIGHT    Special Requests RIGHT LOCULATED EFFUSION    Gram Stain      RARE WBC PRESENT, PREDOMINANTLY PMN NO ORGANISMS SEEN    Culture      NO GROWTH 2 DAYS Performed at [facility] [address], [place], [place] [address]    Report Status PENDING   Surgical pathology   Collection Time: 07/30/19  9:32 AM  Result Value Ref Range   SURGICAL PATHOLOGY      SURGICAL PATHOLOGY CASE: [id number] PATIENT: Roberto Blair Surgical Pathology Report     Clinical History: right loculated effusion (cm)     FINAL MICROSCOPIC DIAGNOSIS:  A. PLEURA, PEEL, RIGHT: - Mixed inflammation. - No malignancy identified.  GROSS DESCRIPTION:  Received fresh for membranous portions of tan-pink tissue and soft yellow adipose tissue which measure 4.7 x 2.8 x 0.5 cm in aggregate. There are no discrete masses.  Sections are submitted in 1 cassette. [facility] 07/30/2019)   Final Diagnosis performed by Vicente Males, MD.   Electronically signed 07/31/2019 Technical component performed at [facility]. [facility], [place] [address], [place], [place] [address].  Professional component performed at [facility], [place]  [address], [place], [place] [address].  Immunohistochemistry Technical component (if applicable) was performed at [facility]. [address], [place], [place], [place] [address].   IMMUNOHISTO CHEMISTRY DISCLAIMER (if applicable): Some of these immunohistochemical stains may have been developed and the performance characteristics determine by [facility]. Some may not have been cleared or approved by the U.S. Food and Drug Administration. The FDA has determined that such clearance or approval is not necessary. This test is used for clinical purposes. It should not be regarded as investigational or for research. This laboratory is certified under the [place] (CLIA-88) as qualified to perform high complexity clinical laboratory testing.  The controls stained appropriately.   CBC   Collection Time: 07/31/19  1:03 AM  Result Value Ref Range   WBC 20.8 (H) 4.0 - 10.5 K/uL   RBC 4.17 (L) 4.22 - 5.81 MIL/uL   Hemoglobin 12.4 (L) 13.0 - 17.0 g/dL   HCT 37.1 (L) 39.0 - 52.0 %   MCV 89.0 80.0 - 100.0 fL   MCH 29.7 26.0 - 34.0 pg   MCHC 33.4 30.0 - 36.0 g/dL   RDW 13.5 11.5 - 15.5 %   Platelets 322 150 - 400 K/uL   nRBC 0.0 0.0 - 0.2 %  Basic metabolic panel   Collection Time: 07/31/19  1:03 AM  Result Value Ref Range   Sodium 133 (L) 135 - 145 mmol/L   Potassium 4.5 3.5 - 5.1 mmol/L   Chloride 101 98 - 111 mmol/L   CO2 24 22 - 32 mmol/L   Glucose, Bld 152 (H) 70 - 99 mg/dL   BUN 7 6 - 20 mg/dL  Creatinine, Ser 0.66 0.61 - 1.24 mg/dL   Calcium 8.2 (L) 8.9 - 10.3 mg/dL   GFR calc non Af Amer >60 >60 mL/min   GFR calc Af Amer >60 >60 mL/min   Anion gap 8 5 - 15  CBC   Collection Time: 08/01/19  2:32 AM  Result Value Ref Range   WBC 11.3 (H) 4.0 - 10.5 K/uL   RBC 4.25 4.22 - 5.81 MIL/uL   Hemoglobin 12.4 (L) 13.0 - 17.0 g/dL   HCT 16.1 (L) 09.6 - 04.5 %   MCV 89.9 80.0 - 100.0 fL   MCH 29.2 26.0 - 34.0 pg    MCHC 32.5 30.0 - 36.0 g/dL   RDW 40.9 81.1 - 91.4 %   Platelets 316 150 - 400 K/uL   nRBC 0.0 0.0 - 0.2 %  Comprehensive metabolic panel   Collection Time: 08/01/19  2:32 AM  Result Value Ref Range   Sodium 137 135 - 145 mmol/L   Potassium 3.7 3.5 - 5.1 mmol/L   Chloride 100 98 - 111 mmol/L   CO2 28 22 - 32 mmol/L   Glucose, Bld 107 (H) 70 - 99 mg/dL   BUN 5 (L) 6 - 20 mg/dL   Creatinine, Ser 7.82 0.61 - 1.24 mg/dL   Calcium 8.2 (L) 8.9 - 10.3 mg/dL   Total Protein 6.1 (L) 6.5 - 8.1 g/dL   Albumin 2.3 (L) 3.5 - 5.0 g/dL   AST 14 (L) 15 - 41 U/L   ALT 17 0 - 44 U/L   Alkaline Phosphatase 85 38 - 126 U/L   Total Bilirubin 0.3 0.3 - 1.2 mg/dL   GFR calc non Af Amer >60 >60 mL/min   GFR calc Af Amer >60 >60 mL/min   Anion gap 9 5 - 15   DG Chest 1 View  Result Date: 08/01/2019 CLINICAL DATA:  Follow-up EXAM: CHEST  1 VIEW COMPARISON:  08/01/2019 FINDINGS: Interval removal of right chest tube. Small residual right apical pneumothorax, stable. Bibasilar atelectasis with small right pleural effusion. Heart is normal size. IMPRESSION: Interval removal of right chest tube with stable small residual right apical pneumothorax. Bibasilar atelectasis, right greater than left and small right effusion. Electronically Signed   By: Charlett Nose M.D.   On: 08/01/2019 15:12   DG Chest 1 View  Result Date: 07/28/2019 CLINICAL DATA:  Pleural effusion EXAM: CHEST  1 VIEW COMPARISON:  Jul 27, 2019 chest radiograph and chest CT FINDINGS: There is a persistent pleural effusion on the right with right base atelectasis. There is mild left base atelectasis. No new opacity evident. Heart size and pulmonary vascularity are normal. No adenopathy. No bone lesions. IMPRESSION: Persistent and stable appearing right pleural effusion. Bibasilar atelectasis, more on the right than on the left. No new opacity evident. Stable cardiac silhouette. Electronically Signed   By: Bretta Bang III M.D.   On: 07/28/2019 13:28    DG Chest 2 View  Result Date: 07/27/2019 CLINICAL DATA:  Cough.  Phlegm EXAM: CHEST - 2 VIEW COMPARISON:  Radiograph 07/24/2019 FINDINGS: Normal cardiac silhouette. Significant increase in RIGHT pleural effusion in short interval follow-up. RIGHT basilar atelectasis noted. LEFT lung clear. Upper lobes clear. IMPRESSION: Marked increase in RIGHT pleural effusion is concerning for parapneumonic effusion. Findings suggest RIGHT lower lobe pneumonia. Followup PA and lateral chest X-ray is recommended in 3-4 weeks following trial of antibiotic therapy to ensure resolution and exclude underlying MALIGNANCY. Electronically Signed   By: Loura Halt.D.  On: 07/27/2019 15:17   DG Chest 2 View  Result Date: 07/24/2019 CLINICAL DATA:  Productive cough for 5 weeks, chest pain in the lower left chest radiating to the back, concern for rib fracture EXAM: CHEST - 2 VIEW COMPARISON:  None. FINDINGS: There are some dense bandlike areas of opacity in the lung bases likely reflecting subsegmental atelectasis or scarring. Finding is on a background of relative hyperinflation of the lungs with flattening of the diaphragms and coarsened interstitial change. Some more hazy opacities in the bilateral lung bases could reflect further atelectasis or early consolidation. There is blunting of the right costophrenic sulcus which could reflect a trace effusion or scarring. No left effusion. No pneumothorax. The cardiomediastinal contours are unremarkable. No acute osseous or soft tissue abnormality. Degenerative changes are present in the imaged spine and shoulders. IMPRESSION: 1. More dense bandlike areas of opacity in the lung bases likely reflecting scarring and/or atelectasis 2. Trace right pleural effusion versus blunting secondary to scarring. 3. Hyperinflation of the lungs and coarsened interstitium may reflect emphysematous or smoking related lung changes. 4. Some more hazy opacities in the bilateral lung bases could  reflect further atelectasis or early consolidation. 5. No visible rib fracture or pneumothorax. Electronically Signed   By: Kreg Shropshire M.D.   On: 07/24/2019 04:36   DG Ribs Unilateral W/Chest Right  Result Date: 07/24/2019 CLINICAL DATA:  Right chest pain. EXAM: RIGHT RIBS AND CHEST - 3+ VIEW COMPARISON:  Jul 23, 2019. FINDINGS: No fracture or other bone lesions are seen involving the ribs. There is no evidence of pneumothorax. Small right pleural effusion is noted with adjacent subsegmental atelectasis. Heart size and mediastinal contours are within normal limits. IMPRESSION: Small right pleural effusion with adjacent subsegmental atelectasis. No rib abnormality is noted. Electronically Signed   By: Lupita Raider M.D.   On: 07/24/2019 16:41   CT Angio Chest PE W and/or Wo Contrast  Result Date: 07/27/2019 CLINICAL DATA:  54 year old male with shortness of breath. EXAM: CT ANGIOGRAPHY CHEST WITH CONTRAST TECHNIQUE: Multidetector CT imaging of the chest was performed using the standard protocol during bolus administration of intravenous contrast. Multiplanar CT image reconstructions and MIPs were obtained to evaluate the vascular anatomy. CONTRAST:  75mL OMNIPAQUE IOHEXOL 350 MG/ML SOLN COMPARISON:  Chest radiograph dated 07/27/2019. FINDINGS: Cardiovascular: Top-normal cardiac size. No pericardial effusion. The thoracic aorta is unremarkable. The origins of the great vessels of the aortic arch appear patent. No pulmonary artery embolus identified. Mediastinum/Nodes: Right hilar adenopathy measures 2 cm. Subcarinal lymph nodes measure 15 mm in short axis. Right paratracheal lymph node measures 13 mm in short axis. The esophagus and the thyroid gland are grossly unremarkable. No mediastinal fluid collection. Multiple mildly enlarged and rounded lymph nodes noted along the right cardiophrenic angle and adjacent to the GE junction. These may be reactive although metastatic disease is not excluded.  Lungs/Pleura: Moderate loculated appearing right pleural effusion with extension into the right fissures. Large area of consolidation involving the right lower and right middle lobes most consistent with atelectasis or pneumonia. There is background of emphysema. Secretions noted in the bronchus intermedius, right middle and right lower lobe bronchi with high-grade bronchial occlusion. A centrally occlusive mass or endobronchial lesion is not excluded. Clinical correlation and close follow-up recommended. Bronchoscopy may provide better evaluation. Upper Abdomen: No acute abnormality. Musculoskeletal: No acute osseous pathology. Review of the MIP images confirms the above findings. IMPRESSION: 1. No CT evidence of pulmonary embolism. 2. Large area of consolidation  involving the right middle and right lower lobes consistent with post obstructive atelectasis or pneumonia. A centrally obstructing mass/neoplasm is not excluded clinical correlation and close follow-up recommended. Bronchoscopy may provide better evaluation. 3. Secretions within the right middle and right lower lobe bronchi. 4. Moderate loculated appearing right pleural effusion with extension into the right fissures. 5. Right hilar and mediastinal adenopathy. 6. Emphysema (ICD10-J43.9). Electronically Signed   By: Elgie Collard M.D.   On: 07/27/2019 20:39   DG Chest Port 1 View  Result Date: 08/01/2019 CLINICAL DATA:  Chest tube, pneumonia. EXAM: PORTABLE CHEST 1 VIEW COMPARISON:  07/31/2019 chest radiograph. FINDINGS: Interval removal of right basilar chest tube. Right chest tube terminating over the upper to mid lung is unchanged. New tiny right apical pneumothorax. Mildly increased right and unchanged left basilar opacities are unchanged. Small right pleural effusion. Partially obscured cardiomediastinal silhouette. IMPRESSION: Interval removal of right basilar chest tube, remaining right chest tube is unchanged. New tiny right apical  pneumothorax. Mildly increased right basilar opacities. Small right pleural effusion and left basilar opacities are unchanged. These results will be called to the ordering clinician or representative by the Radiologist Assistant, and communication documented in the PACS or [employer]. Electronically Signed   By: Stana Bunting M.D.   On: 08/01/2019 09:28   DG CHEST PORT 1 VIEW  Result Date: 07/31/2019 CLINICAL DATA:  Chest tube. EXAM: PORTABLE CHEST 1 VIEW COMPARISON:  07/30/2019 FINDINGS: Cardiomediastinal contours are stable. Hilar structures are unremarkable. Increasing opacity at the LEFT lung base with stable ill-defined opacity at the RIGHT lung base associated with 2 RIGHT-sided chest tubes. Accounting for differences in projection the more cephalad oriented chest tube may have been retracted slightly since the previous exam. No visible pneumothorax. IMPRESSION: Increasing opacity at the LEFT lung base may represent atelectasis or developing infection. Stable ill-defined opacity at the RIGHT lung base associated with 2 RIGHT-sided chest tubes. No visible pneumothorax. Electronically Signed   By: Donzetta Kohut M.D.   On: 07/31/2019 10:07   DG Chest Port 1 View  Result Date: 07/30/2019 CLINICAL DATA:  Chest tube placement. EXAM: PORTABLE CHEST 1 VIEW COMPARISON:  07/28/2019 FINDINGS: Two right chest tubes are in place. There is less pleural fluid on the right. No visible pleural air. Left chest remains clear. IMPRESSION: Two chest tubes placed on the right. Less pleural fluid presently. No pneumothorax. Electronically Signed   By: Paulina Fusi M.D.   On: 07/30/2019 14:03   US THORACENTESIS ASP PLEURAL SPACE W/IMG GUIDE  Result Date: 07/28/2019 INDICATION: Shortness of breath, right-sided pleural effusion. Request for diagnostic and possibly therapeutic thoracentesis. EXAM: ULTRASOUND GUIDED RIGHT THORACENTESIS MEDICATIONS: 1% plain lidocaine, a 6 mL COMPLICATIONS: None immediate.  PROCEDURE: An ultrasound guided thoracentesis was thoroughly discussed with the patient and questions answered. The benefits, risks, alternatives and complications were also discussed. The patient understands and wishes to proceed with the procedure. Written consent was obtained. Ultrasound of the right chest demonstrates a small loculated effusion. Only approachable pocket for safe thoracentesis was more superiorly near the scapula. Site was chosen for thoracentesis. Ultrasound was performed to localize and Stanley an adequate pocket of fluid in the right chest. The area was then prepped and draped in the normal sterile fashion. 1% Lidocaine was used for local anesthesia. Under ultrasound guidance a 6 Fr Safe-T-Centesis catheter was introduced. Thoracentesis was performed. The catheter was removed and a dressing applied. FINDINGS: A total of approximately only 150 mL of hazy, amber colored fluid was  removed. Samples were sent to the laboratory as requested by the clinical team. IMPRESSION: Successful ultrasound guided right thoracentesis yielding 150 mL of pleural fluid. Read by: Brayton El PA-C Electronically Signed   By: Richarda Overlie M.D.   On: 07/28/2019 13:18     Discharge Instructions: Discharge Instructions    Activity as tolerated - No restrictions   Complete by: As directed    Diet general   Complete by: As directed    If the dressing is still on your incision site when you go home, remove it on the third day after your surgery date. Remove dressing if it begins to fall off, or if it is dirty or damaged before the third day.   Complete by: As directed    No wound care   Complete by: As directed    No wound care   Complete by: As directed       Signed: Jenell Milliner, MD 08/02/2019, 6:58 AM   Pager: [phone]

## 2019-08-01 NOTE — Progress Notes (Signed)
Chest tube removed without difficulty.  Patient tolerated well.  Xray called and notified of the time to ensure chest xray in 2 hours

## 2019-08-01 NOTE — Plan of Care (Signed)

## 2019-08-01 NOTE — Progress Notes (Signed)
Subjective:   Patient states that he is doing well. He is ready to get his other chest tube out. He states that he is looking forward to getting home to his own bed and furniture.   Consults: CT surg  Objective:  Vital signs in last 24 hours: Vitals:   07/31/19 2330 08/01/19 0211 08/01/19 0350 08/01/19 0739  BP: 124/67  115/63 (!) 160/73  Pulse: 76 72 70 86  Resp: 16 (!) 22 19 17   Temp: 98.2 F (36.8 C)  98.2 F (36.8 C) 98.8 F (37.1 C)  TempSrc: Oral  Oral Oral  SpO2: 98% 96% 98% 99%  Height:       General: sitting comfortably in chair Cardiac: RRR Pulm: breathing comfortably, decreased breath sounds RLL GI: soft, non-tender, non-distended, normal bowel sounds  I/Os:  Intake/Output Summary (Last 24 hours) at 08/01/2019 1101 Last data filed at 08/01/2019 0700 Gross per 24 hour  Intake 3172.65 ml  Output 1270 ml  Net 1902.65 ml   Telemetry:  Labs: Results for orders placed or performed during the hospital encounter of 07/27/19 (from the past 24 hour(s))  CBC     Status: Abnormal   Collection Time: 08/01/19  2:32 AM  Result Value Ref Range   WBC 11.3 (H) 4.0 - 10.5 K/uL   RBC 4.25 4.22 - 5.81 MIL/uL   Hemoglobin 12.4 (L) 13.0 - 17.0 g/dL   HCT 38.2 (L) 39.0 - 52.0 %   MCV 89.9 80.0 - 100.0 fL   MCH 29.2 26.0 - 34.0 pg   MCHC 32.5 30.0 - 36.0 g/dL   RDW 14.0 11.5 - 15.5 %   Platelets 316 150 - 400 K/uL   nRBC 0.0 0.0 - 0.2 %  Comprehensive metabolic panel     Status: Abnormal   Collection Time: 08/01/19  2:32 AM  Result Value Ref Range   Sodium 137 135 - 145 mmol/L   Potassium 3.7 3.5 - 5.1 mmol/L   Chloride 100 98 - 111 mmol/L   CO2 28 22 - 32 mmol/L   Glucose, Bld 107 (H) 70 - 99 mg/dL   BUN 5 (L) 6 - 20 mg/dL   Creatinine, Ser 0.71 0.61 - 1.24 mg/dL   Calcium 8.2 (L) 8.9 - 10.3 mg/dL   Total Protein 6.1 (L) 6.5 - 8.1 g/dL   Albumin 2.3 (L) 3.5 - 5.0 g/dL   AST 14 (L) 15 - 41 U/L   ALT 17 0 - 44 U/L   Alkaline Phosphatase 85 38 - 126 U/L   Total  Bilirubin 0.3 0.3 - 1.2 mg/dL   GFR calc non Af Amer >60 >60 mL/min   GFR calc Af Amer >60 >60 mL/min   Anion gap 9 5 - 15   Imaging: DG Chest Port 1 View  Result Date: 08/01/2019 CLINICAL DATA:  Chest tube, pneumonia. EXAM: PORTABLE CHEST 1 VIEW COMPARISON:  07/31/2019 chest radiograph. FINDINGS: Interval removal of right basilar chest tube. Right chest tube terminating over the upper to mid lung is unchanged. New tiny right apical pneumothorax. Mildly increased right and unchanged left basilar opacities are unchanged. Small right pleural effusion. Partially obscured cardiomediastinal silhouette. IMPRESSION: Interval removal of right basilar chest tube, remaining right chest tube is unchanged. New tiny right apical pneumothorax. Mildly increased right basilar opacities. Small right pleural effusion and left basilar opacities are unchanged. These results will be called to the ordering clinician or representative by the Radiologist Assistant, and communication documented in the PACS or [employer].  Electronically Signed   By: Stana Bunting M.D.   On: 08/01/2019 09:28   Assessment/Plan:  Roberto Blair is a 54 yo M w/ a PMHx notable for GERD, anxiety, HTN, OCD, sciatica, and chronic opioid use disorder who presented with 5 weeks of cough and one week of chest pain and dyspnea found to have CAP and a loculated pleural effusion.  Assessment and Plan: CAP: Loculated Pleural Effusion: Patient presented with 5 weeks of cough and one week of chest pain and dyspnea. CT imaging in ED demonstrated CAP w/ loculated effusion. This was rapidly developing when comparing his CXR from 5/27 to admission. Thoracentesis performed and consistent with exudative process and no malignant cells on cytology. Consulted pulm who recommended treating with unasyn and consulting CT surg for VATS/bronchoscopy. Pt underwent these procedures yesterday. Bronch did not show a mass. Surgical path negative for malignancy. Two  chest tubes placed with significant improvement in patient's sx. One chest tube removed yesterday. -pain appears adequately treated with home morphine and dilaudid 1 mg q4 -CT surgery may take the second chest tube out this morning and he may be able to be discharged later today  Plan: -Continue unasyn -fu CT surgery recs  -Continue home morphine (MS contin) 30mg  q8 hours -1 mg dilaudid q4 for breakthrough pain -duonebs q6 hours PRN for wheezing or dyspnea -Flutter valve and incentive spirometry  -atarax prn for anxiety  Emphysema of the lung: Noted on Chest CT, which is in conjunction with his smoking history. May need alpha-one antitrypsin testing as an outpatient but will defer to PCP.   Plan: -Continue home Lama/Laba combo Anoro ellipta -Duonebs continued -F/up with PFT's as an outpatient  Chronic tobacco use: Recommended cessation. Patient is not ready to quit cold . Continue outpatient guidance.    Plan: Principal Problem:   Pneumonia Active Problems:   Chronic bilateral low back pain with sciatica   Tobacco use   Attention and concentration deficit   Chronic prescription opiate use   Emphysema of lung (HCC)   Pleural effusion on right  Dispo: Anticipated discharge in approximately 1-2 days.  [place], MD 08/01/2019, 11:01 AM Pager: [phone]

## 2019-08-01 NOTE — Discharge Instructions (Signed)
You were admitted to the hospital with pneumonia and a pleural effusion. You were started on antibiotics. You underwent a thoracentesis to help drain the fluid and to identify the cause of the effusion. The thoracentesis demonstrated a complicated pleural effusion secondary to pneumonia and without signs of cancer. Unfortunately, the pleural effusion was complicated such that it was difficult to drain with the thoracentesis alone. We consulted our pulmonologists who recommended surgery and bronchoscopy by Cardiothoracic Surgery. You underwent these procedures and had two chest tubes placed to help drain the fluid in your pleural effusion. Cells from the thoracentesis did not show any signs of cancer. The bronchoscopy did not show a mass or any signs of cancer. Both chest tubes have been removed without reaccumulation of fluid in your pleura. You should follow up with your primary care doctor within one week. You should continue taking the oral antibiotics we are prescribing for you. You should continue discussing options for smoking cessation with Dr. Court Joy.     Video-Assisted Thoracic Surgery, Care After This sheet gives you information about how to care for yourself after your procedure. Your health care provider may also give you more specific instructions. If you have problems or questions, contact your health care provider. What can I expect after the procedure? After the procedure, it is common to have:  Some pain and soreness in your chest.  Pain when breathing in (inhaling) and coughing.  Constipation.  Fatigue.  Difficulty sleeping. Follow these instructions at home: Preventing pneumonia  Take deep breaths or do breathing exercises as instructed by your health care provider. Doing this helps prevent lung infection (pneumonia).  Cough frequently. Coughing may cause discomfort, but it is important to clear mucus (phlegm) and expand your lungs. If it hurts to cough, hold a pillow against  your chest or place the palms of both hands on top of the incision (use splinting) when you cough. This may help relieve discomfort.  If you were given an incentive spirometer, use it as directed. An incentive spirometer is a tool that measures how well you are filling your lungs with each breath.  Participate in pulmonary rehabilitation as directed by your health care provider. This is a program that combines education, exercise, and support from a team of specialists. The goal is to help you heal and get back to your normal activities as soon as possible. Medicines  Take over-the-counter or prescription medicines only as told by your health care provider.  If you have pain, take pain-relieving medicine before your pain becomes severe. This is important because if your pain is under control, you will be able to breathe and cough more comfortably.  If you were prescribed an antibiotic medicine, take it as told by your health care provider. Do not stop taking the antibiotic even if you start to feel better. Activity  Ask your health care provider what activities are safe for you.  Avoid activities that use your chest muscles for at least 3-4 weeks.  Do not lift anything that is heavier than 10 lb (4.5 kg), or the limit that your health care provider tells you, until he or she says that it is safe. Incision care  Follow instructions from your health care provider about how to take care of your incision(s). Make sure you: ? Wash your hands with soap and water before you change your bandage (dressing). If soap and water are not available, use hand sanitizer. ? Change your dressing as told by your health care provider. ?  Leave stitches (sutures), skin glue, or adhesive strips in place. These skin closures may need to stay in place for 2 weeks or longer. If adhesive strip edges start to loosen and curl up, you may trim the loose edges. Do not remove adhesive strips completely unless your health  care provider tells you to do that.  Keep your dressing dry until it has been removed.  Check your incision area every day for signs of infection. Check for: ? Redness, swelling, or pain. ? Fluid or blood. ? Warmth. ? Pus or a bad smell. Bathing  Do not take baths, swim, or use a hot tub until your health care provider approves. You may take showers.  After your dressing has been removed, use soap and water to gently wash your incision area. Do not use anything else to clean your incision(s) unless your health care provider tells you to do this. Driving   Do not drive until your health care provider approves.  Do not drive or use heavy machinery while taking prescription pain medicine. Eating and drinking  Eat a healthy, balanced diet as instructed by your health care provider. A healthy diet includes plenty of fresh fruits and vegetables, whole grains, and low-fat (lean) proteins.  Limit foods that are high in fat and processed sugars, such as fried and sweet foods.  Drink enough fluid to keep your urine clear or pale yellow. General instructions   To prevent or treat constipation while you are taking prescription pain medicine, your health care provider may recommend that you: ? Take over-the-counter or prescription medicines. ? Eat foods that are high in fiber, such as beans, fresh fruits and vegetables, and whole grains.  Do not use any products that contain nicotine or tobacco, such as cigarettes and e-cigarettes. If you need help quitting, ask your health care provider.  Avoid secondhand smoke.  Wear compression stockings as told by your health care provider. These stockings help to prevent blood clots and reduce swelling in your legs.  If you have a chest tube, care for it as instructed by your health care provider. Do not travel by airplane during the 2 weeks after your chest tube is removed, or until your health care provider says that this is safe.  Keep all  follow-up visits as told by your health care provider. This is important. Contact a health care provider if:  You have redness, swelling, or pain around an incision.  You have fluid or blood coming from an incision.  Your incision area feels warm to the touch.  You have pus or a bad smell coming from an incision.  You have a fever or chills.  You have nausea or vomiting.  You have pain that does not get better with medicine. Get help right away if:  You have chest pain.  Your heart is fluttering or beating rapidly.  You develop a rash.  You have shortness of breath or trouble breathing.  You are confused.  You have trouble speaking.  You feel weak, light-headed, or dizzy.  You faint. Summary  To help prevent lung infection (pneumonia), take deep breaths or do breathing exercises as instructed by your health care provider.  Cough frequently to clear mucus (phlegm) and expand your lungs. If it hurts to cough, hold a pillow against your chest or place the palms of both hands on top of the incision (use splinting) when you cough.  If you have pain, take pain-relieving medicine before your pain becomes severe. This is  important because if your pain is under control, you will be able to breathe and cough more comfortably.  Ask your health care provider what activities are safe for you. This information is not intended to replace advice given to you by your health care provider. Make sure you discuss any questions you have with your health care provider. Document Revised: 01/26/2017 Document Reviewed: 01/24/2016 Elsevier Patient Education  2020 [employer].

## 2019-08-02 LAB — CULTURE, BODY FLUID W GRAM STAIN -BOTTLE: Culture: NO GROWTH

## 2019-08-04 ENCOUNTER — Telehealth: Payer: Self-pay | Admitting: *Deleted

## 2019-08-04 ENCOUNTER — Ambulatory Visit (INDEPENDENT_AMBULATORY_CARE_PROVIDER_SITE_OTHER): Payer: 59 | Admitting: Internal Medicine

## 2019-08-04 ENCOUNTER — Other Ambulatory Visit: Payer: Self-pay

## 2019-08-04 DIAGNOSIS — K921 Melena: Secondary | ICD-10-CM | POA: Insufficient documentation

## 2019-08-04 DIAGNOSIS — R197 Diarrhea, unspecified: Secondary | ICD-10-CM

## 2019-08-04 DIAGNOSIS — R195 Other fecal abnormalities: Secondary | ICD-10-CM

## 2019-08-04 LAB — AEROBIC/ANAEROBIC CULTURE W GRAM STAIN (SURGICAL/DEEP WOUND): Culture: NO GROWTH

## 2019-08-04 NOTE — Progress Notes (Signed)
Internal Medicine Clinic Attending  Case discussed with Dr. Krienke at the time of the visit.  We reviewed the resident's history and exam and pertinent patient test results.  I agree with the assessment, diagnosis, and plan of care documented in the resident's note.    

## 2019-08-04 NOTE — Assessment & Plan Note (Signed)
Patient was admitted from 07/27/19-08/02/19 for community acquired pneumonia, loculated pleural effusion, emphysema, and chronic tobacco use. Patient was discharged home on Augmentin to complete a 10 day course. Reports that he has been having runny diarrhea 1-2 x per day since discharge.  He reports that he had an episode of black diarrhea on the day of his discharge, he thought it may have been from the grapes that he was eating. Ever since he was discharged has been having black and brown runny stools, he has one bowel movement per day, hasn't been as watery since taking the imodium, taking imodium 4-5 times per day. He reports some abdominal cramping but that's it not too bad. He denies any nausea, vomiting, fevers, chills, light headedness, dizziness, hematochezia, changes in diet, chest pain, or SOB. Reports some decreased appetite and feeling a little tired which he attributes to his surgery. He reports that he is still taking the Augmentin, has 8 days left. He has a history of IBS, was worse when he was younger, only an occasional flare up, reports that this seems different. He reports that it started right after the surgery, he was adamant that it started then and was concerned about having some sort of complication or side effect. He believes that he had this happen from the surgery. He was wondering about something that could be given to help his symptoms.   Symptoms and timing of his loose stools seem to be more consistent with a side effect to the antibiotics.  Patient was concerned that this could be related to his surgical intervention, there are no notes about any complications from the procedure, and it does not seem likely that a VATS procedure would cause a complication such as melena, however advised patient to follow-up with the cardiothoracic surgeon to discuss his concerns.  Given the dark stools this could be melena from a GI bleed, he denied any symptoms of anemia however will obtain a CBC  and FOBT to further evaluate.  Advised to switch over his antibiotics however patient does not feel that this is related to his antibiotics.   -CBC and FOBT -Continue imodium PRN

## 2019-08-04 NOTE — Telephone Encounter (Signed)
Patient and wife called in stating patient is having runny diarrhea 1-2 x per day since day of discharge (08/01/2019). Had been receiving IV antibiotics while admitted. Began PO Augmentin on 08/02/2019 and diarrhea has continued. Placed on [facility] schedule for telehealth visit. Kinnie Feil, BSN, RN-BC

## 2019-08-04 NOTE — Progress Notes (Signed)
[facility] Internal [facility] Residency Telephone Encounter Continuity Care Appointment  HPI:   This telephone encounter was created for [name] on 08/04/2019 for the following purpose/cc dark stools and diarrhea.  Patient was admitted from 07/27/19-08/02/19 for community acquired pneumonia, loculated pleural effusion, emphysema, and chronic tobacco use. Patient was discharged home on Augmentin to complete a 10 day course. Reports that he has been having runny diarrhea 1-2 x per day since discharge.   He reports that he had an episode of black diarrhea on the day of his discharge, he thought it may have been from the grapes that he was eating. Ever since he was discharged has been having black and brown runny stools, he has one bowel movement per day, hasn't been as watery since taking the imodium, taking imodium 4-5 times per day. He reports some abdominal cramping but that's it not too bad. He denies any nausea, vomiting, fevers, chills, light headedness, dizziness, hematochezia, changes in diet, chest pain, or SOB. Reports some decreased appetite and feeling a little tired which he attributes to his surgery. He reports that he is still taking the Augmentin, has 8 days left. He has a history of IBS, was worse when he was younger, only an occasional flare up, reports that this seems different.  He reports that it started right after the surgery, he was adamant that it started then and was concerned about having some sort of complication or side effect. He believes that he had this happen from the surgery. He was wondering about something that could be given to help his symptoms.   He reports that his breathing is doing better, is doing breathing exercises.    Past Medical History:  Past Medical History:  Diagnosis Date  . Anxiety   . Chronic back pain   . GERD (gastroesophageal reflux disease)   . Hypertension    prior, was on medication for a few months in the past  . Lumbar disc  herniation   . OCD (obsessive compulsive disorder)   . Sciatica       ROS:   Reports dark stools and diarrhea. Denies fevers, chills, nausea, vomiting, abdominal pain, changes in appetite, fatigue, weakness, lightheadedness, dizziness, headaches, or other symptoms.   Assessment / Plan / Recommendations:   Please see A&P under problem oriented charting for assessment of the patient's acute and chronic medical conditions.   Symptoms and timing of his loose stools seem to be more consistent with a side effect to the antibiotics.  Patient was concerned that this could be related to his surgical intervention, there are no notes about any complications from the procedure, and it does not seem likely that a VATS procedure would cause a complication such as melena, however advised patient to follow-up with the cardiothoracic surgeon to discuss his concerns.  Given the dark stools this could be melena from a GI bleed, he denied any symptoms of anemia however will obtain a CBC and FOBT to further evaluate.  Advised to switch over his antibiotics however patient does not feel that this is related to his antibiotics.   As always, pt is advised that if symptoms worsen or new symptoms arise, they should go to an urgent care facility or to to ER for further evaluation.   Consent and Medical Decision Making:   Patient discussed with Dr. Cleda Daub  This is a telephone encounter between [facility] Roberto Blair and Roberto Blair on 08/04/2019 for dark stools and diarrhea. The visit  was conducted with the patient located at home and Roberto Blair at [facility]. The patient's identity was confirmed using their DOB and current address. The patient has consented to being evaluated through a telephone encounter and understands the associated risks (an examination cannot be done and the patient may need to come in for an appointment) / benefits (allows the patient to remain at home, decreasing exposure to coronavirus). I  personally spent 13 minutes on medical discussion.

## 2019-08-05 ENCOUNTER — Telehealth: Payer: Self-pay | Admitting: Internal Medicine

## 2019-08-05 ENCOUNTER — Other Ambulatory Visit: Payer: 59 | Admitting: *Deleted

## 2019-08-05 DIAGNOSIS — R195 Other fecal abnormalities: Secondary | ICD-10-CM

## 2019-08-05 DIAGNOSIS — R197 Diarrhea, unspecified: Secondary | ICD-10-CM

## 2019-08-05 LAB — CBC
HCT: 37.6 % — ABNORMAL LOW (ref 39.0–52.0)
Hemoglobin: 12.2 g/dL — ABNORMAL LOW (ref 13.0–17.0)
MCH: 29.3 pg (ref 26.0–34.0)
MCHC: 32.4 g/dL (ref 30.0–36.0)
MCV: 90.4 fL (ref 80.0–100.0)
Platelets: 355 10*3/uL (ref 150–400)
RBC: 4.16 MIL/uL — ABNORMAL LOW (ref 4.22–5.81)
RDW: 14.2 % (ref 11.5–15.5)
WBC: 9.3 10*3/uL (ref 4.0–10.5)
nRBC: 0 % (ref 0.0–0.2)

## 2019-08-05 NOTE — Addendum Note (Signed)
Addended by: Bufford Spikes on: 08/05/2019 11:34 AM   Modules accepted: Orders

## 2019-08-05 NOTE — Telephone Encounter (Signed)
   Reason for call:   I received a call from [name] at 6 PM indicating he missed a phone call from our office and thought it was about his lab results from earlier today..   Pertinent Data:   We discussed his lab results, which showed his Hgb has remained stable despite his darker stools.  We discussed again his diarrhea that he has had since leaving the hospital and how it may be due to his antibiotic. He is concerned that it is something else as he had an episode before taking his home antibiotic. His diarrhea was initially water, then became more loose/spft and occurred 1-2 times per day. He took some imodium with improvement and has not had a bowl movement today.    Assessment / Plan / Recommendations:   We discussed his options and we decided to continue his current medication for now given his symptoms are improving. He was also remined to stop imodium if he was still taking this.  If symptoms return/worsen he will call back for antibiotic change, otherwise he will be seen on Friday morning for follow up as previously scheduled.   As always, pt is advised that if symptoms worsen or new symptoms arise, they should go to an urgent care facility or to to ER for further evaluation.   Beola Cord, MD   08/05/2019, 7:13 PM

## 2019-08-06 ENCOUNTER — Other Ambulatory Visit: Payer: 59

## 2019-08-06 ENCOUNTER — Other Ambulatory Visit: Payer: Self-pay

## 2019-08-07 LAB — FECAL OCCULT BLOOD, IMMUNOCHEMICAL: Fecal Occult Bld: POSITIVE — AB

## 2019-08-08 ENCOUNTER — Other Ambulatory Visit: Payer: Self-pay | Admitting: Cardiothoracic Surgery

## 2019-08-08 ENCOUNTER — Ambulatory Visit (INDEPENDENT_AMBULATORY_CARE_PROVIDER_SITE_OTHER): Payer: 59 | Admitting: Internal Medicine

## 2019-08-08 VITALS — BP 124/75 | HR 79

## 2019-08-08 DIAGNOSIS — J869 Pyothorax without fistula: Secondary | ICD-10-CM | POA: Insufficient documentation

## 2019-08-08 DIAGNOSIS — K921 Melena: Secondary | ICD-10-CM | POA: Diagnosis not present

## 2019-08-08 DIAGNOSIS — J90 Pleural effusion, not elsewhere classified: Secondary | ICD-10-CM

## 2019-08-08 DIAGNOSIS — J439 Emphysema, unspecified: Secondary | ICD-10-CM

## 2019-08-08 HISTORY — DX: Pyothorax without fistula: J86.9

## 2019-08-08 MED ORDER — PANTOPRAZOLE SODIUM 40 MG PO TBEC: 40.0000 mg | DELAYED_RELEASE_TABLET | Freq: Every day | ORAL | 5 refills | Status: DC

## 2019-08-08 NOTE — Assessment & Plan Note (Signed)
Patient was admitted from 07/27/19-08/02/19 for community acquired pneumonia, loculated pleural effusion, emphysema, and chronic tobacco use.  He had a chest tube placed for the empyema and this was removed prior to discharge.  Patient was discharged home on Augmentin to complete a 10 day course.  From his breathing standpoint he reports that this is doing well, he feels that his breathing has been improving, he continues to use the incentive spirometer and has been trying to walk around more.  He does report some mild right-sided chest pain in feels that it is going towards the center of his chest, feels that it is related to his surgical site.  He has follow-up with CT surgery on Monday, and will discuss with them if the sutures need to be removed and his concerns about melena being related to the surgery.  -Follow-up with surgery

## 2019-08-08 NOTE — Progress Notes (Signed)
Internal Medicine Clinic Attending  Case discussed with Dr. Krienke at the time of the visit.  We reviewed the resident's history and exam and pertinent patient test results.  I agree with the assessment, diagnosis, and plan of care documented in the resident's note.    

## 2019-08-08 NOTE — Assessment & Plan Note (Signed)
Patient reports that he has not had any bowel movements today or yesterday, last bowel movement was about 2 days ago before he dropped off the stool sample, he reports that he had one after he dropped it off which was more brown then black.  He also endorses some lower abdominal cramping that seems to be worsening.  Denies any nausea, vomiting, lightheadedness, dizziness, fevers, chills, headaches, or other symptoms.  He does report having a history of ulcers about 10 to 15 years ago, this was diagnosed in [place]. He has been on PPI however has not been on anything recently.  He was given a short course of Protonix earlier this year however he reports that he has not been taking this.  Denies any excessive NSAID use, denies any changes to his diet or any other symptoms.  Labs that were obtained earlier this week showed a stable hemoglobin of 12, and a positive FOBT.  His vitals have been stable and he is not showing signs of any significant anemia.  We did not obtain a BMP however will his prior BUN his Blatchford score is 2.  We could consider admission for an endoscopy and further management however since patient is hemodynamically stable and appears to be doing well we will repeat labs today and have a stat outpatient GI referral.  -CBC and BMP today -Stat GI referral -Start Protonix 40 mg daily

## 2019-08-08 NOTE — Patient Instructions (Signed)
[name],  It was a pleasure to see you today. Thank you for coming in.   Today we discussed your dark stools and abdominal cramping. This is concerning for bleeding from your GI tract. We are repeating some labs today and have referred you to the stomach doctor. Please start taking pantoprazole 40 mg daily. If you continue to have dark stools, have worsening pain, have bright red blood in your stools, or start feeling worse please come into the ED to be evaluated.   We also discussed your chest discomfort. Please follow up with the surgeon for this.   Please return to clinic in 1 month or sooner if needed.   Thank you again for coming in.   Claudean Severance.D.

## 2019-08-08 NOTE — Progress Notes (Signed)
   CC: Hospital follow up for CAP and pleural effusion, and melena  HPI:  [name] is a 54 y.o. with a history listed below presenting for hospital follow-up and follow-up of his melena and the CAP and pleural effusion.   Past Medical History:  Diagnosis Date  . Anxiety   . Chronic back pain   . GERD (gastroesophageal reflux disease)   . Hypertension    prior, was on medication for a few months in the past  . Lumbar disc herniation   . OCD (obsessive compulsive disorder)   . Sciatica    Review of Systems:   Constitutional: Negative for chills and fever.  Respiratory: Negative for shortness of breath.   Cardiovascular: Negative for chest pain and leg swelling.  Gastrointestinal: Positive for abdominal cramping, lower abdomen. Negative for nausea and vomiting. Neurological: Negative for dizziness and headaches.   Physical Exam:  Vitals:   08/08/19 0931  BP: 124/75  Pulse: 79   Physical Exam Constitutional:      Appearance: Normal appearance.  HENT:     Head: Normocephalic and atraumatic.     Mouth/Throat:     Mouth: Mucous membranes are moist.     Pharynx: Oropharynx is clear.  Cardiovascular:     Rate and Rhythm: Normal rate and regular rhythm.     Pulses: Normal pulses.     Heart sounds: Normal heart sounds.  Pulmonary:     Effort: Pulmonary effort is normal.     Breath sounds: Normal breath sounds.  Abdominal:     General: Abdomen is flat. Bowel sounds are normal. There is no distension.     Palpations: Abdomen is soft.     Tenderness: There is no abdominal tenderness.  Musculoskeletal:        General: Normal range of motion.     Cervical back: Normal range of motion and neck supple.     Comments: Right chest wall surgical wound, no drainage, minimal redness around sutures  Skin:    General: Skin is warm and dry.     Capillary Refill: Capillary refill takes less than 2 seconds.  Neurological:     General: No focal deficit present.     Mental  Status: He is alert and oriented to person, place, and time.  Psychiatric:        Mood and Affect: Mood normal.        Behavior: Behavior normal.      Assessment & Plan:   See Encounters Tab for problem based charting.  Patient discussed with Dr. Mikey Bussing

## 2019-08-09 LAB — CBC
Hematocrit: 38.3 % (ref 37.5–51.0)
Hemoglobin: 12.8 g/dL — ABNORMAL LOW (ref 13.0–17.7)
MCH: 29.4 pg (ref 26.6–33.0)
MCHC: 33.4 g/dL (ref 31.5–35.7)
MCV: 88 fL (ref 79–97)
Platelets: 354 10*3/uL (ref 150–450)
RBC: 4.35 x10E6/uL (ref 4.14–5.80)
RDW: 13.3 % (ref 11.6–15.4)
WBC: 7.6 10*3/uL (ref 3.4–10.8)

## 2019-08-09 LAB — BMP8+ANION GAP
Anion Gap: 16 mmol/L (ref 10.0–18.0)
BUN/Creatinine Ratio: 15 (ref 9–20)
BUN: 11 mg/dL (ref 6–24)
CO2: 22 mmol/L (ref 20–29)
Calcium: 8.9 mg/dL (ref 8.7–10.2)
Chloride: 98 mmol/L (ref 96–106)
Creatinine, Ser: 0.74 mg/dL — ABNORMAL LOW (ref 0.76–1.27)
GFR calc Af Amer: 120 mL/min/{1.73_m2} (ref >59)
GFR calc non Af Amer: 104 mL/min/{1.73_m2} (ref >59)
Glucose: 90 mg/dL (ref 65–99)
Potassium: 5.1 mmol/L (ref 3.5–5.2)
Sodium: 136 mmol/L (ref 134–144)

## 2019-08-11 ENCOUNTER — Ambulatory Visit
Admission: RE | Admit: 2019-08-11 | Discharge: 2019-08-11 | Disposition: A | Discharge status: Home / Self Care | Payer: 59 | Source: Ambulatory Visit | Attending: Cardiothoracic Surgery | Admitting: Cardiothoracic Surgery

## 2019-08-11 ENCOUNTER — Other Ambulatory Visit: Payer: Self-pay

## 2019-08-11 ENCOUNTER — Ambulatory Visit (INDEPENDENT_AMBULATORY_CARE_PROVIDER_SITE_OTHER): Payer: Self-pay | Admitting: Cardiothoracic Surgery

## 2019-08-11 VITALS — BP 119/78 | HR 87 | Temp 98.2°F | Resp 20 | Wt 210.0 lb

## 2019-08-11 DIAGNOSIS — J90 Pleural effusion, not elsewhere classified: Secondary | ICD-10-CM

## 2019-08-11 DIAGNOSIS — J869 Pyothorax without fistula: Secondary | ICD-10-CM

## 2019-08-12 NOTE — Progress Notes (Signed)
[address]       [place] [address]             [phone]     CARDIOTHORACIC SURGERY OFFICE NOTE  Referring Provider is Lorre Nick, MD Primary Cardiologist is No primary care provider on file. PCP is Albertha Ghee, MD   HPI:  54 year old gentleman presented with several weeks of worsening chest discomfort.  His evaluation demonstrated a right-sided empyema.  He underwent right VATS for pulmonary decortication and pleurodesis.  He now returns for his initial postoperative examination.  He states that his symptoms are markedly improved and he denies any shortness of breath.  He remains on Augmentin but this is running out soon   Current Outpatient Medications  Medication Sig Dispense Refill  . albuterol (VENTOLIN HFA) 108 (90 Base) MCG/ACT inhaler Inhale 2 puffs into the lungs every 6 (six) hours as needed for wheezing or shortness of breath.    . amphetamine-dextroamphetamine (ADDERALL) 20 MG tablet Take 1 tablet (20 mg total) by mouth 2 (two) times daily. (Patient taking differently: Take 20 mg by mouth See admin instructions. Take one tablet (20 mg) by mouth twice daily - early morning and at lunch (may also take 2 tablets (40 mg) every morning instead of twice daily)) 60 tablet 0  . buPROPion (WELLBUTRIN XL) 150 MG 24 hr tablet Take 1 tablet (150 mg total) by mouth daily. 30 tablet 1  . carisoprodol (SOMA) 350 MG tablet Take 1 tablet (350 mg total) by mouth 3 (three) times daily as needed for muscle spasms. (Patient taking differently: Take 350 mg by mouth in the morning, at noon, and at bedtime. ) 90 tablet 0  . cloNIDine (CATAPRES) 0.1 MG tablet Take 0.2 mg by mouth at bedtime.    . gabapentin (NEURONTIN) 800 MG tablet Take 1 tablet (800 mg total) by mouth 4 (four) times daily. (Patient taking differently: Take 800 mg by mouth in the morning, at noon, and at bedtime. ) 120 tablet 0  . Morphine Sulfate ER 30 MG T12A Take 30 mg by mouth 3 (three) times  daily as needed. (Patient taking differently: Take 30 mg by mouth in the morning, at noon, and at bedtime. ) 90 tablet 0  . pantoprazole (PROTONIX) 40 MG tablet Take 1 tablet (40 mg total) by mouth daily. 30 tablet 5  . QUEtiapine (SEROQUEL) 100 MG tablet Take 2 tablets (200 mg total) by mouth at bedtime. 60 tablet 0  . umeclidinium-vilanterol (ANORO ELLIPTA) 62.5-25 MCG/INH AEPB Inhale 1 puff into the lungs daily as needed (shortness of breath).     No current facility-administered medications for this visit.      Physical Exam:   BP 119/78   Pulse 87   Temp 98.2 F (36.8 C) (Oral)   Resp 20   Wt 95.3 kg   SpO2 95% Comment: ra  BMI 28.40 kg/m   General:  Well-appearing no distress  Chest:   Clear to auscultation  CV:   Regular rate and rhythm  Incisions:  Well-healed, sutures are removed  Extremities:  Warm and well-perfused  Diagnostic Tests:  Chest x-ray with small right effusion at the sulcus   Impression:  Doing well after right VATS for empyema  Plan:  Follow-up as needed with thoracic surgery May return to work full schedule light duty next Monday,  anticipate full duty full schedule in another month I spent in excess of 15 minutes during the conduct of this office  consultation and >50% of this time involved direct face-to-face encounter with the patient for counseling and/or coordination of their care.  Level 2                 10 minutes Level 3                 15 minutes Level 4                 25 minutes Level 5                 40 minutes  B.  Murvin Natal, MD 08/12/2019 5:58 AM

## 2019-08-15 ENCOUNTER — Other Ambulatory Visit: Payer: Self-pay

## 2019-08-15 DIAGNOSIS — F5104 Psychophysiologic insomnia: Secondary | ICD-10-CM

## 2019-08-15 DIAGNOSIS — R41840 Attention and concentration deficit: Secondary | ICD-10-CM

## 2019-08-15 DIAGNOSIS — G8929 Other chronic pain: Secondary | ICD-10-CM

## 2019-08-15 DIAGNOSIS — M5440 Lumbago with sciatica, unspecified side: Secondary | ICD-10-CM

## 2019-08-15 MED ORDER — MORPHINE SULFATE ER 30 MG PO T12A: 30.0000 mg | EXTENDED_RELEASE_TABLET | Freq: Three times a day (TID) | ORAL | 0 refills | Status: DC | PRN

## 2019-08-15 MED ORDER — GABAPENTIN 800 MG PO TABS: 800.0000 mg | ORAL_TABLET | Freq: Three times a day (TID) | ORAL | 0 refills | Status: DC

## 2019-08-15 MED ORDER — CARISOPRODOL 350 MG PO TABS: 350.0000 mg | ORAL_TABLET | Freq: Three times a day (TID) | ORAL | 0 refills | Status: DC | PRN

## 2019-08-15 NOTE — Telephone Encounter (Signed)
Refilled Morphine Sulfate ER, gabapentin, and carisoprodol. Phyciatry is managing other medications, so I refused refill of Adderall , clonidine , and Seroquel. I will need to discuss taper of Carisoprodol with Roberto Blair in person at our next visit as mentioned in previous notes.

## 2019-08-20 ENCOUNTER — Telehealth (INDEPENDENT_AMBULATORY_CARE_PROVIDER_SITE_OTHER): Payer: 59 | Admitting: Psychiatry

## 2019-08-20 ENCOUNTER — Encounter (HOSPITAL_COMMUNITY): Payer: Self-pay | Admitting: Psychiatry

## 2019-08-20 ENCOUNTER — Other Ambulatory Visit: Payer: Self-pay

## 2019-08-20 VITALS — Wt 210.0 lb

## 2019-08-20 DIAGNOSIS — R41840 Attention and concentration deficit: Secondary | ICD-10-CM | POA: Diagnosis not present

## 2019-08-20 DIAGNOSIS — F5104 Psychophysiologic insomnia: Secondary | ICD-10-CM

## 2019-08-20 DIAGNOSIS — F319 Bipolar disorder, unspecified: Secondary | ICD-10-CM

## 2019-08-20 MED ORDER — QUETIAPINE FUMARATE 100 MG PO TABS: ORAL_TABLET | ORAL | 0 refills | Status: DC

## 2019-08-20 MED ORDER — AMPHETAMINE-DEXTROAMPHETAMINE 20 MG PO TABS: 20.0000 mg | ORAL_TABLET | Freq: Every morning | ORAL | 0 refills | Status: DC

## 2019-08-20 MED ORDER — LAMOTRIGINE 25 MG PO TABS: ORAL_TABLET | ORAL | 0 refills | Status: DC

## 2019-08-20 NOTE — Progress Notes (Signed)
Virtual Visit via Telephone Note  I connected with Roberto Blair on 08/20/19 at 11:20 AM EDT by telephone and verified that I am speaking with the correct person using two identifiers.   I discussed the limitations, risks, security and privacy concerns of performing an evaluation and management service by telephone and the availability of in person appointments. I also discussed with the patient that there may be a patient responsible charge related to this service. The patient expressed understanding and agreed to proceed.   Patient Location: Home Provider Location: Home Office  History of Present Illness: Patient is 54 year old Caucasian, married employed man who was seen first time 4 weeks ago.  He was referred from his PCP Dr. Delice Blair for the medication management.  We cut down his Adderall and try Wellbutrin to help his focus and energy.  1 week after the appointment he was admitted to the hospital because of shortness of breath and found to have pneumonia, pleural effusion.  He stayed in the hospital for week and now feeling better.  He is still on light duty but started work this Monday.  He reported Wellbutrin did not help his focus and energy.  He continues to struggle with insomnia.  He is taking Seroquel 200 mg at bedtime.  His PCP prescribed clonidine but sometimes he does feel it does not help as much.  He also had irritability, anger and severe mood swings.  He is taking Adderall 20 mg in the morning and insists that he need a higher dose which she used to take 40 mg.  I explained stimulant may cause worsening of insomnia and he does not have psychological testing and established diagnosis of ADHD.  He was prescribed to help his focus and energy.  Today patient told his PCP in [facility] Dr. Rodolph Blair had given the Adderall because he had some screening test for ADD.  I explained Adderall is a controlled substance and need a formal diagnosis and I am happy to refer him for psychological  testing if he agree.  In the meantime we will continue the same dose of 20 mg Adderall.  I also explained that he still have residual symptoms of mood lability, anger and should consider mood stabilizer.  His big concern is lack of sleep.  He recently had procedure and he admitted his frustration irritability and insomnia could be pain related.  He is taking Soma and morphine to help the pain.  He also takes gabapentin.  He has tingling and numbness.  He has chronic health issues.  He is a Dealer.  He lives with his wife who is very supportive.  Denies drinking or using any illegal substances.   Past Psychiatric History: H/O anxiety, insomnia, mood swings and irritability.  Prescribed Seroquel and Klonopin by Dr. Milagros Blair and later Adderall was added by PCP Dr. Rodolph Blair in [place].  Klonopin was switched to clonidine by PCP.  No h/o inpatient, suicidal attempt, psychosis.   Recent Results (from the past 2160 hour(s))  Novel Coronavirus, NAA ([facility])     Status: None   Collection Time: 07/16/19 10:25 AM   Specimen: Nasopharyngeal(NP) swabs in vial transport medium   NASOPHARYNGE  IS THIS  Result Value Ref Range   SARS-CoV-2, NAA Not Detected Not Detected    Comment: This nucleic acid amplification test was developed and its performance characteristics determined by [employer]. Nucleic acid amplification tests include RT-PCR and TMA. This test has not been FDA cleared or approved. This test has been  authorized by FDA under an Emergency Use Authorization (EUA). This test is only authorized for the duration of time the declaration that circumstances exist justifying the authorization of the emergency use of in vitro diagnostic tests for detection of SARS-CoV-2 virus and/or diagnosis of COVID-19 infection under section 564(b)(1) of the Act, 21 U.S.C. 242AST-4(H) (1), unless the authorization is terminated or revoked sooner. When diagnostic testing is negative, the possibility of  a false negative result should be considered in the context of a patient's recent exposures and the presence of clinical signs and symptoms consistent with COVID-19. An individual without symptoms of COVID-19 and who is not shedding SARS-CoV-2 virus wo uld expect to have a negative (not detected) result in this assay.   SARS-COV-2, NAA 2 DAY TAT     Status: None   Collection Time: 07/16/19 10:25 AM   NASOPHARYNGE  IS THIS  Result Value Ref Range   SARS-CoV-2, NAA 2 DAY TAT Performed   CBC no Diff     Status: Abnormal   Collection Time: 07/16/19 11:28 AM  Result Value Ref Range   WBC 12.2 (H) 3.4 - 10.8 x10E3/uL   RBC 4.60 4.14 - 5.80 x10E6/uL   Hemoglobin 13.4 13.0 - 17.7 g/dL   Hematocrit 41.8 37.5 - 51.0 %   MCV 91 79 - 97 fL   MCH 29.1 26.6 - 33.0 pg   MCHC 32.1 31 - 35 g/dL   RDW 13.0 11.6 - 15.4 %   Platelets 270 150 - 450 x10E3/uL  BMP8+Anion Gap     Status: Abnormal   Collection Time: 07/16/19 11:28 AM  Result Value Ref Range   Glucose 107 (H) 65 - 99 mg/dL   BUN 9 6 - 24 mg/dL   Creatinine, Ser 0.82 0.76 - 1.27 mg/dL   GFR calc non Af Amer 100 >59 mL/min/1.73   GFR calc Af Amer 115 >59 mL/min/1.73    Comment: **[facility] currently reports eGFR in compliance with the current**   recommendations of the [employer]. [facility] will   update reporting as new guidelines are published from the NKF-ASN   Task force.    BUN/Creatinine Ratio 11 9 - 20   Sodium 137 134 - 144 mmol/L   Potassium 4.7 3.5 - 5.2 mmol/L   Chloride 100 96 - 106 mmol/L   CO2 24 20 - 29 mmol/L   Anion Gap 13.0 10.0 - 18.0 mmol/L   Calcium 8.6 (L) 8.7 - 10.2 mg/dL  Basic metabolic panel     Status: Abnormal   Collection Time: 07/27/19  2:58 PM  Result Value Ref Range   Sodium 135 135 - 145 mmol/L   Potassium 4.2 3.5 - 5.1 mmol/L   Chloride 101 98 - 111 mmol/L   CO2 22 22 - 32 mmol/L   Glucose, Bld 143 (H) 70 - 99 mg/dL    Comment: Glucose reference range applies only to samples taken  after fasting for at least 8 hours.   BUN 9 6 - 20 mg/dL   Creatinine, Ser 0.72 0.61 - 1.24 mg/dL   Calcium 8.6 (L) 8.9 - 10.3 mg/dL   GFR calc non Af Amer >60 >60 mL/min   GFR calc Af Amer >60 >60 mL/min   Anion gap 12 5 - 15    Comment: Performed at [facility] [address] [place], [place] [address]  CBC     Status: Abnormal   Collection Time: 07/27/19  2:58 PM  Result Value Ref Range  WBC 16.0 (H) 4.0 - 10.5 K/uL   RBC 4.54 4.22 - 5.81 MIL/uL   Hemoglobin 13.4 13.0 - 17.0 g/dL   HCT 40.2 39 - 52 %   MCV 88.5 80.0 - 100.0 fL   MCH 29.5 26.0 - 34.0 pg   MCHC 33.3 30.0 - 36.0 g/dL   RDW 13.7 11.5 - 15.5 %   Platelets 341 150 - 400 K/uL   nRBC 0.0 0.0 - 0.2 %    Comment: Performed at [facility] [address], [place], [place] [address]  Troponin I (High Sensitivity)     Status: None   Collection Time: 07/27/19  2:58 PM  Result Value Ref Range   Troponin I (High Sensitivity) 3 <18 ng/L    Comment: (NOTE) Elevated high sensitivity troponin I (hsTnI) values and significant  changes across serial measurements may suggest ACS but many other  chronic and acute conditions are known to elevate hsTnI results.  Refer to the "Links" section for chest pain algorithms and additional  guidance. Performed at [facility], [place] [address], [place], [place] [address]   D-dimer, quantitative (not at [facility])     Status: Abnormal   Collection Time: 07/27/19  2:58 PM  Result Value Ref Range   D-Dimer, Quant 1.83 (H) 0.00 - 0.50 ug/mL-FEU    Comment: (NOTE) At the manufacturer cut-off of 0.50 ug/mL FEU, this assay has been documented to exclude PE with a sensitivity and negative predictive value of 97 to 99%.  At this time, this assay has not been approved by the FDA to exclude DVT/VTE. Results should be correlated with clinical presentation. Performed at [facility], [place] [address] [place], [place] [address]   CK     Status: Abnormal   Collection Time:  07/27/19  2:58 PM  Result Value Ref Range   Total CK 18 (L) 49.0 - 397.0 U/L    Comment: Performed at [facility], [place] [address] [place], [place] [address]  SARS Coronavirus 2 by RT PCR (hospital order, performed in [facility] hospital lab) Nasopharyngeal Nasopharyngeal Swab     Status: None   Collection Time: 07/27/19  3:35 PM   Specimen: Nasopharyngeal Swab  Result Value Ref Range   SARS Coronavirus 2 NEGATIVE NEGATIVE    Comment: (NOTE) SARS-CoV-2 target nucleic acids are NOT DETECTED. The SARS-CoV-2 RNA is generally detectable in upper and lower respiratory specimens during the acute phase of infection. The lowest concentration of SARS-CoV-2 viral copies this assay can detect is 250 copies / mL. A negative result does not preclude SARS-CoV-2 infection and should not be used as the sole basis for treatment or other patient management decisions.  A negative result may occur with improper specimen collection / handling, submission of specimen other than nasopharyngeal swab, presence of viral mutation(s) within the areas targeted by this assay, and inadequate number of viral copies (<250 copies / mL). A negative result must be combined with clinical observations, patient history, and epidemiological information. Fact Sheet for Patients:   StrictlyIdeas.no Fact Sheet for Healthcare Providers: BankingDealers.co.za This test is not yet approved or cleared  by the [place] FDA and has been authorized for detection and/or diagnosis of SARS-CoV-2 by FDA under an Emergency Use Authorization (EUA).  This EUA will remain in effect (meaning this test can be used) for the duration of the COVID-19 declaration under Section 564(b)(1) of the Act, 21 U.S.C. section 360bbb-3(b)(1), unless the authorization is terminated or  revoked sooner. Performed at [facility], [place] [address], [place], [place] [address]   CBC     Status:  Abnormal   Collection Time: 07/28/19  4:32 AM  Result Value Ref Range   WBC 13.0 (H) 4.0 - 10.5 K/uL   RBC 4.22 4.22 - 5.81 MIL/uL   Hemoglobin 12.4 (L) 13.0 - 17.0 g/dL   HCT 37.7 (L) 39 - 52 %   MCV 89.3 80.0 - 100.0 fL   MCH 29.4 26.0 - 34.0 pg   MCHC 32.9 30.0 - 36.0 g/dL   RDW 13.8 11.5 - 15.5 %   Platelets 284 150 - 400 K/uL   nRBC 0.0 0.0 - 0.2 %    Comment: Performed at [facility], [place] [address], [place], [place] [address]  Comprehensive metabolic panel     Status: Abnormal   Collection Time: 07/28/19  4:32 AM  Result Value Ref Range   Sodium 134 (L) 135 - 145 mmol/L   Potassium 3.9 3.5 - 5.1 mmol/L   Chloride 98 98 - 111 mmol/L   CO2 27 22 - 32 mmol/L   Glucose, Bld 105 (H) 70 - 99 mg/dL    Comment: Glucose reference range applies only to samples taken after fasting for at least 8 hours.   BUN 8 6 - 20 mg/dL   Creatinine, Ser 0.74 0.61 - 1.24 mg/dL   Calcium 8.5 (L) 8.9 - 10.3 mg/dL   Total Protein 6.6 6.5 - 8.1 g/dL   Albumin 2.5 (L) 3.5 - 5.0 g/dL   AST 20 15 - 41 U/L   ALT 29 0 - 44 U/L   Alkaline Phosphatase 115 38 - 126 U/L   Total Bilirubin 0.8 0.3 - 1.2 mg/dL   GFR calc non Af Amer >60 >60 mL/min   GFR calc Af Amer >60 >60 mL/min   Anion gap 9 5 - 15    Comment: Performed at [facility], [place] [address], [place], [place] [address]  HIV Antibody (routine testing w rflx)     Status: None   Collection Time: 07/28/19  4:32 AM  Result Value Ref Range   HIV Screen 4th Generation wRfx Non Reactive Non Reactive    Comment: Performed at [facility], [place] [address], [place], [place] [address]  Lactate dehydrogenase     Status: Abnormal   Collection Time: 07/28/19 10:58 AM  Result Value Ref Range   LDH 97 (L) 98 - 192 U/L    Comment: Performed at [facility], [place] [address], [place], [place] [address]  Protein, total     Status: None   Collection Time: 07/28/19 10:58 AM  Result Value Ref Range   Total Protein 7.0 6.5 - 8.1 g/dL     Comment: Performed at [facility], [place] [address] [place], [place] [address]  Lactate dehydrogenase (pleural or peritoneal fluid)     Status: Abnormal   Collection Time: 07/28/19  1:18 PM  Result Value Ref Range   LD, Fluid 335 (H) 3 - 23 U/L    Comment: (NOTE) Results should be evaluated in conjunction with serum values    Fluid Type-FLDH CYTO PLEU RIGHT     Comment: Performed at [facility] [address], [place], [place] [address] CORRECTED ON 05/31 AT 1406: PREVIOUSLY REPORTED AS CYTO PLEU   Body fluid cell count with differential     Status: Abnormal   Collection Time: 07/28/19  1:18 PM  Result Value Ref Range   Fluid Type-FCT CYTO PLEU RIGHT     Comment: CORRECTED ON 05/31 AT 1406: PREVIOUSLY REPORTED AS CYTO PLEU   Color, Fluid YELLOW    Appearance, Fluid CLOUDY (A) CLEAR   Total Nucleated Cell Count, Fluid 105,000 (H) 0 - 1,000 cu mm   Neutrophil Count, Fluid 69 (H) 0 - 25 %   Lymphs, Fluid 7 %   Monocyte-Macrophage-Serous Fluid 23 (L) 50 - 90 %   Eos, Fluid 1 %   Other Cells, Fluid MESOTHELIAL CELLS PRESENT %    Comment: Performed at [facility], [place] [address] [place], [place] [address]  Albumin, pleural or peritoneal fluid     Status: None   Collection Time: 07/28/19  1:18 PM  Result Value Ref Range   Albumin, Fluid 2.1 g/dL    Comment: (NOTE) No normal range established for this test Results should be evaluated in conjunction with serum values    Fluid Type-FALB CYTO PLEU RIGHT     Comment: Performed at [facility] [address], [place], [place] [address] CORRECTED ON 05/31 AT 1406: PREVIOUSLY REPORTED AS CYTO PLEU   Protein, pleural or peritoneal fluid     Status: None   Collection Time: 07/28/19  1:18 PM  Result Value Ref Range   Total protein, fluid 4.7 g/dL    Comment: (NOTE) No normal range established for this test Results should be evaluated in conjunction with serum values    Fluid Type-FTP CYTO PLEU RIGHT      Comment: Performed at [facility] [address], [place], [place] [address] CORRECTED ON 05/31 AT 1406: PREVIOUSLY REPORTED AS CYTO PLEU   Fungus Culture With Stain     Status: None (Preliminary result)   Collection Time: 07/28/19  1:18 PM   Specimen: PATH Cytology Pleural fluid  Result Value Ref Range   Fungus Stain Final report     Comment: (NOTE) Performed At: [facility] [place], [place] [id number] Rush Farmer MD WI:[id number]    Fungus (Mycology) Culture PENDING    Fungal Source PLEURAL     Comment: FLUID  Glucose, pleural or peritoneal fluid     Status: None   Collection Time: 07/28/19  1:18 PM  Result Value Ref Range   Glucose, Fluid 106 mg/dL    Comment: (NOTE) No normal range established for this test Results should be evaluated in conjunction with serum values    Fluid Type-FGLU CYTO PLEU RIGHT     Comment: Performed at [facility], [place] [address], [place], [place] [address] CORRECTED ON 05/31 AT 1406: PREVIOUSLY REPORTED AS CYTO PLEU   Acid Fast Smear (AFB)     Status: None   Collection Time: 07/28/19  1:18 PM   Specimen: PATH Cytology Pleural fluid  Result Value Ref Range   AFB Specimen Processing Concentration    Acid Fast Smear Negative     Comment: (NOTE) Performed At: [facility] [place], [place] [id number] Rush Farmer MD [id number]    Source (AFB) PLEURAL     Comment: FLUID  Gram stain     Status: None   Collection Time: 07/28/19  1:18 PM   Specimen: Pleura  Result Value Ref Range   Specimen Description PLEURAL FLUID    Special Requests NONE    Gram Stain      CYTOSPIN SMEAR ABUNDANT WBC PRESENT, PREDOMINANTLY PMN NO ORGANISMS SEEN Performed at  [facility], [place] [address] [place], [place] [address]    Report Status 07/28/2019 FINAL   Culture, body fluid-bottle     Status: None   Collection Time: 07/28/19  1:18 PM   Specimen: Pleura  Result Value Ref Range    Specimen Description PLEURAL FLUID    Special Requests NONE    Culture      NO GROWTH 5 DAYS Performed at [facility], [place] [address] [place], [place] [address]    Report Status 08/02/2019 FINAL   Fungus Culture Result     Status: None   Collection Time: 07/28/19  1:18 PM  Result Value Ref Range   Result 1 Comment     Comment: (NOTE) KOH/Calcofluor preparation:  no fungus observed. Performed At: [facility] [place], [place] [id number] Rush Farmer MD [id number]   Cytology - Non PAP;     Status: None   Collection Time: 07/28/19  1:18 PM  Result Value Ref Range   CYTOLOGY - NON GYN      CYTOLOGY - NON PAP CASE: [id number] PATIENT: Sanjay Teagle Non-Gynecological Cytology Report     Clinical History: None provided Specimen Submitted:  A. PLEURAL FLUID, RIGHT, THORACENTESIS:   FINAL MICROSCOPIC DIAGNOSIS: - No malignant cells identified  SPECIMEN ADEQUACY: Satisfactory for evaluation  DIAGNOSTIC COMMENTS: Severe inflammation is present.  GROSS: Received is/are: 100cc's of red fluid. (KJ:[id number]) Smears: 0 Concentration Method (ThinPrep): 1 Cell Block: 1 Conventional Additional Studies: N/A     Final Diagnosis performed by Jaquita Folds, MD.   Electronically signed 07/30/2019 Technical component performed at [employer], [place] [address], [place], [place] [address].  Professional component performed at [facility], [place] [address] [place], [place] [address].  Immunohistochemistry Technical component (if applicable) was performed at [facility]. [place], [place] [address].   IMMUNOHISTOCHEMISTRY DISCLAIMER (if applicable): Some of these immunohistochemical stains may have been developed and the performance characteristics determine by [facility]. Some may not have been cleared or approved by the U.S. Food and Drug Administration.  The FDA has determined that such clearance or approval is not necessary. This test is used for clinical purposes. It should not be regarded as investigational or for research. This laboratory is certified under the [place] (CLIA-88) as qualified to perform high complexity clinical laboratory testing.  The controls stained appropriately.   Procalcitonin - Baseline     Status: None   Collection Time: 07/28/19  4:47 PM  Result Value Ref Range   Procalcitonin <0.10 ng/mL    Comment:        Interpretation: PCT (Procalcitonin) <= 0.5 ng/mL: Systemic infection (sepsis) is not likely. Local bacterial infection is possible. (NOTE)       Sepsis PCT Algorithm           Lower Respiratory Tract                                      Infection PCT Algorithm    ----------------------------     ----------------------------         PCT < 0.25 ng/mL                PCT < 0.10 ng/mL         Strongly encourage  Strongly discourage   discontinuation of antibiotics    initiation of antibiotics    ----------------------------     -----------------------------       PCT 0.25 - 0.50 ng/mL            PCT 0.10 - 0.25 ng/mL               OR       >80% decrease in PCT            Discourage initiation of                                            antibiotics      Encourage discontinuation           of antibiotics    ----------------------------     -----------------------------         PCT >= 0.50 ng/mL              PCT 0.26 - 0.50 ng/mL               AND        <80% decrease in PCT             Encourage initiation of                                             antibiotics       Encourage continuation           of antibiotics    ----------------------------     -----------------------------        PCT >= 0.50 ng/mL                  PCT > 0.50 ng/mL               AND         increase in PCT                  Strongly encourage                                       initiation of antibiotics    Strongly encourage escalation           of antibiotics                                     -----------------------------                                           PCT <= 0.25 ng/mL                                                 OR                                        >  80% decrease in PCT                                     Discontinue / Do not initiate                                             antibiotics Performed at [facility], [place] [address] [place], [place] [address]   Expectorated sputum assessment w rflx to resp cult     Status: None   Collection Time: 07/28/19  4:59 PM   Specimen: Expectorated Sputum  Result Value Ref Range   Specimen Description EXPECTORATED SPUTUM    Special Requests NONE    Sputum evaluation      THIS SPECIMEN IS ACCEPTABLE FOR SPUTUM CULTURE Performed at [facility], [place] [address], [place], [place] [address]    Report Status 07/28/2019 FINAL   Culture, respiratory     Status: None   Collection Time: 07/28/19  4:59 PM  Result Value Ref Range   Specimen Description EXPECTORATED SPUTUM    Special Requests NONE Reflexed from [id number]    Gram Stain      ABUNDANT WBC PRESENT, PREDOMINANTLY PMN MODERATE GRAM POSITIVE COCCI FEW YEAST RARE GRAM NEGATIVE RODS    Culture      Consistent with normal respiratory flora. Performed at [facility], [place] [address], [place], [place] [address]    Report Status 07/30/2019 FINAL   CBC with Differential/Platelet     Status: Abnormal   Collection Time: 07/29/19  6:42 AM  Result Value Ref Range   WBC 11.6 (H) 4.0 - 10.5 K/uL   RBC 4.15 (L) 4.22 - 5.81 MIL/uL   Hemoglobin 12.3 (L) 13.0 - 17.0 g/dL   HCT 36.6 (L) 39 - 52 %   MCV 88.2 80.0 - 100.0 fL   MCH 29.6 26.0 - 34.0 pg   MCHC 33.6 30.0 - 36.0 g/dL   RDW 13.9 11.5 - 15.5 %   Platelets 316 150 - 400 K/uL   nRBC 0.0 0.0 - 0.2 %   Neutrophils Relative % 73 %   Neutro Abs 8.4 (H) 1.7 - 7.7 K/uL    Lymphocytes Relative 15 %   Lymphs Abs 1.8 0.7 - 4.0 K/uL   Monocytes Relative 9 %   Monocytes Absolute 1.0 0 - 1 K/uL   Eosinophils Relative 2 %   Eosinophils Absolute 0.3 0 - 0 K/uL   Basophils Relative 0 %   Basophils Absolute 0.1 0 - 0 K/uL   Immature Granulocytes 1 %   Abs Immature Granulocytes 0.07 0.00 - 0.07 K/uL    Comment: Performed at [facility], [place] [address], [place], [place] [address]  Urinalysis, Routine w reflex microscopic     Status: Abnormal   Collection Time: 07/29/19  7:24 PM  Result Value Ref Range   Color, Urine YELLOW YELLOW   APPearance CLEAR CLEAR   Specific Gravity, Urine 1.025 1.005 - 1.030   pH 5.0 5.0 - 8.0   Glucose, UA NEGATIVE NEGATIVE mg/dL   Hgb urine dipstick NEGATIVE NEGATIVE   Bilirubin Urine NEGATIVE NEGATIVE   Ketones, ur NEGATIVE NEGATIVE mg/dL   Protein, ur NEGATIVE NEGATIVE mg/dL   Nitrite NEGATIVE NEGATIVE   Leukocytes,Ua TRACE (A) NEGATIVE  RBC / HPF 0-5 0 - 5 RBC/hpf   WBC, UA 0-5 0 - 5 WBC/hpf   Bacteria, UA RARE (A) NONE SEEN   Mucus PRESENT     Comment: Performed at [facility], [place] [address] [place], [place] [address]  Blood gas, arterial on room air     Status: Abnormal   Collection Time: 07/29/19  8:00 PM  Result Value Ref Range   FIO2 21.00    pH, Arterial 7.445 7.35 - 7.45   pCO2 arterial 34.9 32 - 48 mmHg   pO2, Arterial 62.2 (L) 83 - 108 mmHg   Bicarbonate 23.5 20.0 - 28.0 mmol/L   Acid-base deficit 0.0 0.0 - 2.0 mmol/L   O2 Saturation 91.0 %   Patient temperature 37.3    Collection site RIGHT RADIAL    Drawn by [phone]     Comment: COLLECTED BY RT   Sample type ARTERIAL    Allens test (pass/fail) PASS PASS    Comment: Performed at [facility], [place] [address], [place], [place] [address]  CBC     Status: Abnormal   Collection Time: 07/29/19  8:27 PM  Result Value Ref Range   WBC 11.1 (H) 4.0 - 10.5 K/uL   RBC 4.28 4.22 - 5.81 MIL/uL   Hemoglobin 12.6 (L) 13.0 - 17.0 g/dL   HCT 38.1 (L)  39 - 52 %   MCV 89.0 80.0 - 100.0 fL   MCH 29.4 26.0 - 34.0 pg   MCHC 33.1 30.0 - 36.0 g/dL   RDW 13.8 11.5 - 15.5 %   Platelets 303 150 - 400 K/uL   nRBC 0.0 0.0 - 0.2 %    Comment: Performed at [facility], [place] [address], [place], [place] [address]  Comprehensive metabolic panel     Status: Abnormal   Collection Time: 07/29/19  8:27 PM  Result Value Ref Range   Sodium 136 135 - 145 mmol/L   Potassium 4.7 3.5 - 5.1 mmol/L    Comment: NO VISIBLE HEMOLYSIS   Chloride 99 98 - 111 mmol/L   CO2 27 22 - 32 mmol/L   Glucose, Bld 118 (H) 70 - 99 mg/dL    Comment: Glucose reference range applies only to samples taken after fasting for at least 8 hours.   BUN 10 6 - 20 mg/dL   Creatinine, Ser 0.83 0.61 - 1.24 mg/dL   Calcium 8.3 (L) 8.9 - 10.3 mg/dL   Total Protein 6.6 6.5 - 8.1 g/dL   Albumin 2.4 (L) 3.5 - 5.0 g/dL   AST 15 15 - 41 U/L   ALT 27 0 - 44 U/L   Alkaline Phosphatase 108 38 - 126 U/L   Total Bilirubin 0.4 0.3 - 1.2 mg/dL   GFR calc non Af Amer >60 >60 mL/min   GFR calc Af Amer >60 >60 mL/min   Anion gap 10 5 - 15    Comment: Performed at [facility], [place] [address], [place], [place] [address]  Protime-INR     Status: Abnormal   Collection Time: 07/29/19  8:27 PM  Result Value Ref Range   Prothrombin Time 15.8 (H) 11.4 - 15.2 seconds   INR 1.3 (H) 0.8 - 1.2    Comment: (NOTE) INR goal varies based on device and disease states. Performed at [facility], [place] [address] [place], [place] [address]   APTT     Status: Abnormal   Collection Time: 07/29/19  8:27 PM  Result Value Ref Range   aPTT 43 (H) 24 - 36 seconds    Comment:        IF BASELINE aPTT IS ELEVATED, SUGGEST PATIENT RISK ASSESSMENT BE USED TO DETERMINE APPROPRIATE ANTICOAGULANT THERAPY. Performed at [facility], [place] [address], [place], [place] [address]   Type and screen [place]     Status: None   Collection Time: 07/29/19  8:27 PM  Result Value Ref  Range   ABO/RH(D) O POS    Antibody Screen NEG    Sample Expiration      08/01/2019,2359 Performed at [facility], [place] [address], [place], [place] [address]   ABO/Rh     Status: None   Collection Time: 07/29/19  8:27 PM  Result Value Ref Range   ABO/RH(D)      O POS Performed at [place] [address] [place], [place] [address]   MRSA PCR Screening     Status: None   Collection Time: 07/29/19  9:59 PM   Specimen: Nasal Mucosa; Nasopharyngeal  Result Value Ref Range   MRSA by PCR NEGATIVE NEGATIVE    Comment:        The GeneXpert MRSA Assay (FDA approved for NASAL specimens only), is one component of a comprehensive MRSA colonization surveillance program. It is not intended to diagnose MRSA infection nor to guide or monitor treatment for MRSA infections. Performed at [facility], [place] [address], [place], [place] [address]   CBC with Differential/Platelet     Status: Abnormal   Collection Time: 07/30/19  2:11 AM  Result Value Ref Range   WBC 10.9 (H) 4.0 - 10.5 K/uL   RBC 3.98 (L) 4.22 - 5.81 MIL/uL   Hemoglobin 11.7 (L) 13.0 - 17.0 g/dL   HCT 35.3 (L) 39 - 52 %   MCV 88.7 80.0 - 100.0 fL   MCH 29.4 26.0 - 34.0 pg   MCHC 33.1 30.0 - 36.0 g/dL   RDW 13.8 11.5 - 15.5 %   Platelets 292 150 - 400 K/uL   nRBC 0.0 0.0 - 0.2 %   Neutrophils Relative % 67 %   Neutro Abs 7.4 1.7 - 7.7 K/uL   Lymphocytes Relative 19 %   Lymphs Abs 2.1 0.7 - 4.0 K/uL   Monocytes Relative 9 %   Monocytes Absolute 1.0 0 - 1 K/uL   Eosinophils Relative 3 %   Eosinophils Absolute 0.3 0 - 0 K/uL   Basophils Relative 1 %   Basophils Absolute 0.1 0 - 0 K/uL   Immature Granulocytes 1 %   Abs Immature Granulocytes 0.09 (H) 0.00 - 0.07 K/uL    Comment: Performed at [facility], [place] [address], [place], [place] [address]  Aerobic/Anaerobic Culture (surgical/deep wound)     Status: None   Collection Time: 07/30/19  9:18 AM   Specimen: PATH Cytology Pleural fluid;  Body Fluid  Result Value Ref Range   Specimen Description PLEURAL FLUID RIGHT    Special Requests RIGHT LOCULATED EFFUSION    Gram Stain      RARE WBC PRESENT, PREDOMINANTLY PMN NO ORGANISMS SEEN    Culture      No growth aerobically or anaerobically. Performed at [facility], [place] [address] [place], [place] [address]    Report Status 08/04/2019 FINAL   Surgical pathology     Status: None   Collection Time: 07/30/19  9:32 AM  Result Value Ref Range   SURGICAL PATHOLOGY      SURGICAL PATHOLOGY CASE: [phone] PATIENT: Bardia Blaschke Surgical Pathology Report     Clinical History: right loculated effusion (cm)     FINAL MICROSCOPIC DIAGNOSIS:  A. PLEURA, PEEL, RIGHT: - Mixed inflammation. - No malignancy identified.  GROSS DESCRIPTION:  Received fresh for membranous portions of tan-pink tissue and soft yellow adipose tissue which measure 4.7 x 2.8 x 0.5 cm in aggregate. There are no discrete masses.  Sections are submitted in 1 cassette. [facility] 07/30/2019)   Final Diagnosis performed by Vicente Males, MD.   Electronically signed 07/31/2019 Technical component performed at [facility]. [facility], [place] [address] [place], [place] [address].  Professional component performed at [facility], [place] [address] [place], [place] [address].  Immunohistochemistry Technical component (if applicable) was performed at [facility]. [address] [place], [place], [place] [address].   IMMUNOHISTO CHEMISTRY DISCLAIMER (if applicable): Some of these immunohistochemical stains may have been developed and the performance characteristics determine by [facility]. Some may not have been cleared or approved by the U.S. Food and Drug Administration. The FDA has determined that such clearance or approval is not necessary. This test is used for clinical purposes. It should not be regarded as investigational or for research.  This laboratory is certified under the [employer] (CLIA-88) as qualified to perform high complexity clinical laboratory testing.  The controls stained appropriately.   CBC     Status: Abnormal   Collection Time: 07/31/19  1:03 AM  Result Value Ref Range   WBC 20.8 (H) 4.0 - 10.5 K/uL   RBC 4.17 (L) 4.22 - 5.81 MIL/uL   Hemoglobin 12.4 (L) 13.0 - 17.0 g/dL   HCT 37.1 (L) 39 - 52 %   MCV 89.0 80.0 - 100.0 fL   MCH 29.7 26.0 - 34.0 pg   MCHC 33.4 30.0 - 36.0 g/dL   RDW 13.5 11.5 - 15.5 %   Platelets 322 150 - 400 K/uL   nRBC 0.0 0.0 - 0.2 %    Comment: Performed at [facility], [place] [address], [place], [place] [address]  Basic metabolic panel     Status: Abnormal   Collection Time: 07/31/19  1:03 AM  Result Value Ref Range   Sodium 133 (L) 135 - 145 mmol/L   Potassium 4.5 3.5 - 5.1 mmol/L   Chloride 101 98 - 111 mmol/L   CO2 24 22 - 32 mmol/L   Glucose, Bld 152 (H) 70 - 99 mg/dL    Comment: Glucose reference range applies only to samples taken after fasting for at least 8 hours.   BUN 7 6 - 20 mg/dL   Creatinine, Ser 0.66 0.61 - 1.24 mg/dL   Calcium 8.2 (L) 8.9 - 10.3 mg/dL   GFR calc non Af Amer >60 >60 mL/min   GFR calc Af Amer >60 >60 mL/min   Anion gap 8 5 - 15    Comment: Performed at [place] [address], [place] [address]  CBC     Status: Abnormal   Collection Time: 08/01/19  2:32 AM  Result Value Ref Range   WBC 11.3 (H) 4.0 - 10.5 K/uL   RBC 4.25 4.22 - 5.81 MIL/uL   Hemoglobin 12.4 (L) 13.0 - 17.0 g/dL   HCT 38.2 (L) 39 - 52 %   MCV 89.9 80.0 - 100.0 fL  MCH 29.2 26.0 - 34.0 pg   MCHC 32.5 30.0 - 36.0 g/dL   RDW 14.0 11.5 - 15.5 %   Platelets 316 150 - 400 K/uL   nRBC 0.0 0.0 - 0.2 %    Comment: Performed at [facility], [place] [address] [place], [place] [address]  Comprehensive metabolic panel     Status: Abnormal   Collection Time: 08/01/19  2:32 AM  Result Value Ref Range    Sodium 137 135 - 145 mmol/L   Potassium 3.7 3.5 - 5.1 mmol/L   Chloride 100 98 - 111 mmol/L   CO2 28 22 - 32 mmol/L   Glucose, Bld 107 (H) 70 - 99 mg/dL    Comment: Glucose reference range applies only to samples taken after fasting for at least 8 hours.   BUN 5 (L) 6 - 20 mg/dL   Creatinine, Ser 0.71 0.61 - 1.24 mg/dL   Calcium 8.2 (L) 8.9 - 10.3 mg/dL   Total Protein 6.1 (L) 6.5 - 8.1 g/dL   Albumin 2.3 (L) 3.5 - 5.0 g/dL   AST 14 (L) 15 - 41 U/L   ALT 17 0 - 44 U/L   Alkaline Phosphatase 85 38 - 126 U/L   Total Bilirubin 0.3 0.3 - 1.2 mg/dL   GFR calc non Af Amer >60 >60 mL/min   GFR calc Af Amer >60 >60 mL/min   Anion gap 9 5 - 15    Comment: Performed at [place] [address], [place], [place] [address]  CBC no Diff     Status: Abnormal   Collection Time: 08/05/19 11:30 AM  Result Value Ref Range   WBC 9.3 4.0 - 10.5 K/uL   RBC 4.16 (L) 4.22 - 5.81 MIL/uL   Hemoglobin 12.2 (L) 13.0 - 17.0 g/dL   HCT 37.6 (L) 39 - 52 %   MCV 90.4 80.0 - 100.0 fL   MCH 29.3 26.0 - 34.0 pg   MCHC 32.4 30.0 - 36.0 g/dL   RDW 14.2 11.5 - 15.5 %   Platelets 355 150 - 400 K/uL   nRBC 0.0 0.0 - 0.2 %    Comment: Performed at [facility], [place] [address], [place], [place] [address]  Fecal occult blood, imunochemical     Status: Abnormal   Collection Time: 08/06/19 12:00 AM   Specimen: Stool   Stool  Result Value Ref Range   Fecal Occult Bld Positive (A) Negative  BMP8+Anion Gap     Status: Abnormal   Collection Time: 08/08/19 10:20 AM  Result Value Ref Range   Glucose 90 65 - 99 mg/dL   BUN 11 6 - 24 mg/dL   Creatinine, Ser 0.74 (L) 0.76 - 1.27 mg/dL   GFR calc non Af Amer 104 >59 mL/min/1.73   GFR calc Af Amer 120 >59 mL/min/1.73    Comment: **[facility] currently reports eGFR in compliance with the current**   recommendations of the [employer]. [facility] will   update reporting as new guidelines are published from the NKF-ASN   Task force.     BUN/Creatinine Ratio 15 9 - 20   Sodium 136 134 - 144 mmol/L   Potassium 5.1 3.5 - 5.2 mmol/L   Chloride 98 96 - 106 mmol/L   CO2 22 20 - 29 mmol/L   Anion Gap 16.0 10.0 - 18.0 mmol/L   Calcium 8.9 8.7 - 10.2 mg/dL  CBC no Diff     Status: Abnormal  Collection Time: 08/08/19 10:20 AM  Result Value Ref Range   WBC 7.6 3.4 - 10.8 x10E3/uL   RBC 4.35 4.14 - 5.80 x10E6/uL   Hemoglobin 12.8 (L) 13.0 - 17.7 g/dL   Hematocrit 38.3 37.5 - 51.0 %   MCV 88 79 - 97 fL   MCH 29.4 26.6 - 33.0 pg   MCHC 33.4 31 - 35 g/dL   RDW 13.3 11.6 - 15.4 %   Platelets 354 150 - 450 x10E3/uL     Psychiatric Specialty Exam: Physical Exam  Review of Systems  Neurological: Positive for numbness.    Weight 210 lb (95.3 kg).Body mass index is 28.4 kg/m.  General Appearance: NA  Eye Contact:  NA  Speech:  Clear and Coherent and Slow  Volume:  Normal  Mood:  Anxious  Affect:  NA  Thought Process:  Descriptions of Associations: Intact  Orientation:  Full (Time, Place, and Person)  Thought Content:  Rumination  Suicidal Thoughts:  No  Homicidal Thoughts:  No  Memory:  Immediate;   Good Recent;   Good Remote;   Good  Judgement:  Intact  Insight:  Shallow  Psychomotor Activity:  NA  Concentration:  Concentration: Good and Attention Span: Good  Recall:  Good  Fund of Knowledge:  Good  Language:  Good  Akathisia:  No  Handed:  Right  AIMS (if indicated):     Assets:  Communication Skills Desire for Improvement Housing Resilience Social Support Transportation  ADL's:  Intact  Cognition:  WNL  Sleep:   poor      Assessment and Plan: Bipolar disorder type I.  Chronic insomnia.  ADHD by history  I reviewed his blood work results, current medication.  Patient had insomnia and mood lability and anger issues.  I will defer increasing the Adderall and explained that we need psychological testing to establish the diagnosis of ADD to continue the Adderall in the future.  We will refer her for  psychological testing.  In the meantime we will continue Adderall 20 mg to take in the morning.  Discontinue Wellbutrin since it does not help him.  I recommend to try Seroquel up to 300 mg to help insomnia, residual mood swings.  However I reminded that he is taking pain medicine and he need to be careful taking higher dose of Seroquel.  I recommend to try 250 mg first and if that does not work then he can take 300 mg.  I would also start low-dose Lamictal to help with mood lability and anger issues.  We will start 25 mg daily for 1 week and then 50 mg daily.  Reminded that if it causes rash then he need to stop the medication immediately.  Discussed medication side effects and benefits.  Follow-up in 4 weeks.  Time spent 30 minutes.  Follow Up Instructions:    I discussed the assessment and treatment plan with the patient. The patient was provided an opportunity to ask questions and all were answered. The patient agreed with the plan and demonstrated an understanding of the instructions.   The patient was advised to call back or seek an in-person evaluation if the symptoms worsen or if the condition fails to improve as anticipated.  I provided 30 minutes of non-face-to-face time during this encounter.   Kathlee Nations, MD

## 2019-08-21 ENCOUNTER — Ambulatory Visit (HOSPITAL_BASED_OUTPATIENT_CLINIC_OR_DEPARTMENT_OTHER): Payer: 59

## 2019-08-25 ENCOUNTER — Telehealth: Payer: Self-pay | Admitting: *Deleted

## 2019-08-25 NOTE — Telephone Encounter (Addendum)
Information was sent through CoverMyMeds to [facility] for PA for Morphine.  Awaiting determination .  [facility] [phone].  Angelina Ok, RN 08/25/2019 9:15 AM. PA was approved on 08/25/2019 thru 11/23/2019.  Angelina Ok, RN 09/08/2019 9:47 AM.

## 2019-08-27 LAB — FUNGUS CULTURE WITH STAIN

## 2019-08-27 LAB — FUNGAL ORGANISM REFLEX

## 2019-08-27 LAB — FUNGUS CULTURE RESULT

## 2019-09-04 NOTE — Progress Notes (Signed)
Error encounter. 

## 2019-09-10 LAB — ACID FAST CULTURE WITH REFLEXED SENSITIVITIES (MYCOBACTERIA): Acid Fast Culture: NEGATIVE

## 2019-09-17 ENCOUNTER — Encounter (HOSPITAL_COMMUNITY): Payer: Self-pay | Admitting: Psychiatry

## 2019-09-17 ENCOUNTER — Telehealth (INDEPENDENT_AMBULATORY_CARE_PROVIDER_SITE_OTHER): Payer: 59 | Admitting: Psychiatry

## 2019-09-17 ENCOUNTER — Other Ambulatory Visit: Payer: Self-pay

## 2019-09-17 DIAGNOSIS — F319 Bipolar disorder, unspecified: Secondary | ICD-10-CM

## 2019-09-17 DIAGNOSIS — R41840 Attention and concentration deficit: Secondary | ICD-10-CM

## 2019-09-17 DIAGNOSIS — F5104 Psychophysiologic insomnia: Secondary | ICD-10-CM | POA: Diagnosis not present

## 2019-09-17 MED ORDER — LAMOTRIGINE 100 MG PO TABS: 100.0000 mg | ORAL_TABLET | Freq: Every day | ORAL | 1 refills | Status: DC

## 2019-09-17 MED ORDER — QUETIAPINE FUMARATE 100 MG PO TABS: ORAL_TABLET | ORAL | 0 refills | Status: DC

## 2019-09-17 NOTE — Progress Notes (Signed)
Virtual Visit via Telephone Note  I connected with Roberto Blair on 09/17/19 at 11:40 AM EDT by telephone and verified that I am speaking with the correct person using two identifiers.  Location: Patient: Home Provider: Home office   I discussed the limitations, risks, security and privacy concerns of performing an evaluation and management service by telephone and the availability of in person appointments. I also discussed with the patient that there may be a patient responsible charge related to this service. The patient expressed understanding and agreed to proceed.   History of Present Illness: Patient is evaluated by phone session.  We increased Seroquel on the last visit and he is doing better.  He is still have some time sleep issues but much improved from the past.  He is taking Adderall 20 mg in the morning.  Patient is working as a Curator and he feels it is helping his focus, energy and multitasking.  Denies any irritability and anger.  He feels the Lamictal had helped him his mood swings but is still have some time residual irritability.  He is taking Soma, morphine, gabapentin.  He has chronic health issues.  He lives with his wife who is very supportive.  He denies drinking or using any illegal substances.   Past Psychiatric History: H/O anxiety, insomnia, mood swings and irritability. Prescribed Seroquel and Klonopin by Dr. Elna Breslow and later Adderall was added by PCP Dr. Abigail Butts in [place]. Klonopin was switched to clonidine by PCP. We tried Wellbutrin but that did not help him.  No h/o inpatient, suicidal attempt, psychosis.    Psychiatric Specialty Exam: Physical Exam  Review of Systems  There were no vitals taken for this visit.There is no height or weight on file to calculate BMI.  General Appearance: NA  Eye Contact:  NA  Speech:  Clear and Coherent  Volume:  Normal  Mood:  Dysphoric  Affect:  NA  Thought Process:  Goal Directed  Orientation:  Full  (Time, Place, and Person)  Thought Content:  Logical  Suicidal Thoughts:  No  Homicidal Thoughts:  No  Memory:  Immediate;   Good Recent;   Good Remote;   Good  Judgement:  Intact  Insight:  Present  Psychomotor Activity:  NA  Concentration:  Concentration: Fair and Attention Span: Fair  Recall:  Fair  Fund of Knowledge:  Good  Language:  Good  Akathisia:  No  Handed:  Right  AIMS (if indicated):     Assets:  Communication Skills Desire for Improvement Housing Resilience Social Support Transportation  ADL's:  Intact  Cognition:  WNL  Sleep:   fair      Assessment and Plan: Bipolar disorder type I.  Chronic insomnia.  ADHD by history.  Patient doing better with increased Seroquel and Lamictal.  He has no rash, itching tremors or shakes.  I recommend to trial Lamictal 100 mg to help his residual mood lability.  Continue Seroquel 250 -300 mg at bedtime.  Overall he feels things going well and he is tolerating medication without any concern.  He has enough refills for his Adderall and reminded him that he need to call us to get a new prescription when he is running below.  He agreed with the plan.  We will continue Adderall 20 mg in the morning.  Discussed medication side effects and benefits.  Recommended to call us back if is any question or any concern.  Follow-up in 2 months.  Follow Up Instructions:  I discussed the assessment and treatment plan with the patient. The patient was provided an opportunity to ask questions and all were answered. The patient agreed with the plan and demonstrated an understanding of the instructions.   The patient was advised to call back or seek an in-person evaluation if the symptoms worsen or if the condition fails to improve as anticipated.  I provided 15 minutes of non-face-to-face time during this encounter.   Kathlee Nations, MD

## 2019-09-20 ENCOUNTER — Telehealth: Payer: Self-pay | Admitting: Internal Medicine

## 2019-09-20 DIAGNOSIS — M5440 Lumbago with sciatica, unspecified side: Secondary | ICD-10-CM

## 2019-09-20 DIAGNOSIS — G8929 Other chronic pain: Secondary | ICD-10-CM

## 2019-09-20 MED ORDER — MORPHINE SULFATE ER 30 MG PO T12A: 30.0000 mg | EXTENDED_RELEASE_TABLET | Freq: Three times a day (TID) | ORAL | 0 refills | Status: DC | PRN

## 2019-09-20 MED ORDER — GABAPENTIN 800 MG PO TABS: 800.0000 mg | ORAL_TABLET | Freq: Three times a day (TID) | ORAL | 0 refills | Status: DC

## 2019-09-20 MED ORDER — CARISOPRODOL 350 MG PO TABS: 350.0000 mg | ORAL_TABLET | Freq: Three times a day (TID) | ORAL | 0 refills | Status: DC | PRN

## 2019-09-20 NOTE — Telephone Encounter (Signed)
° °  Reason for call:   I received a call from the wife of [name] at 4:20 PM indicating that he is requesting a refill on his medications.   Pertinent Data:   This is a 54 year old male with a history of chronic back pain. Wife reports that he wanted her to call and ask for him. He is currently at work. They were requesting a refill on the MS-Contin and Soma. Reports being on those medications for a long time. On chart review there is a pain contract for the Morphine Sulfate ER 30 mg TID and Carisoprodol 350 mg TID. Discussion of tapering off the Carisoprodol on following visits.    Assessment / Plan / Recommendations:   Patient on Morphine Sulfate ER 30 mg TID and Carisoprodol 350 mg TID for his chronic back pain. Reviewed PDMP aware and refills have been appropriate. Last seen in clinic for this on 4/21. Advised that I can do a one time refill however he will need to follow up with his PCP to have further refills.   As always, pt is advised that if symptoms worsen or new symptoms arise, they should go to an urgent care facility or to to ER for further evaluation.   Claudean Severance, MD   09/20/2019, 4:21 PM

## 2019-10-07 ENCOUNTER — Telehealth (HOSPITAL_COMMUNITY): Payer: Self-pay | Admitting: *Deleted

## 2019-10-07 DIAGNOSIS — R41840 Attention and concentration deficit: Secondary | ICD-10-CM

## 2019-10-07 MED ORDER — AMPHETAMINE-DEXTROAMPHETAMINE 20 MG PO TABS: 20.0000 mg | ORAL_TABLET | Freq: Every morning | ORAL | 0 refills | Status: DC

## 2019-10-07 NOTE — Telephone Encounter (Signed)
Send to [employer] at [employer].

## 2019-10-07 NOTE — Telephone Encounter (Signed)
Patient called and requested refill of Adderall. Patient has appt 9/21. Please review and advise.

## 2019-10-20 ENCOUNTER — Encounter: Payer: Self-pay | Admitting: Internal Medicine

## 2019-10-20 DIAGNOSIS — M5440 Lumbago with sciatica, unspecified side: Secondary | ICD-10-CM

## 2019-10-20 DIAGNOSIS — F5104 Psychophysiologic insomnia: Secondary | ICD-10-CM

## 2019-10-20 DIAGNOSIS — G8929 Other chronic pain: Secondary | ICD-10-CM

## 2019-10-21 ENCOUNTER — Telehealth: Payer: Self-pay | Admitting: *Deleted

## 2019-10-21 MED ORDER — CARISOPRODOL 350 MG PO TABS: 350.0000 mg | ORAL_TABLET | Freq: Three times a day (TID) | ORAL | 0 refills | Status: DC | PRN

## 2019-10-21 MED ORDER — MORPHINE SULFATE ER 30 MG PO T12A: 30.0000 mg | EXTENDED_RELEASE_TABLET | Freq: Three times a day (TID) | ORAL | 0 refills | Status: DC | PRN

## 2019-10-21 MED ORDER — CLONIDINE HCL 0.1 MG PO TABS: 0.2000 mg | ORAL_TABLET | Freq: Every day | ORAL | 1 refills | Status: DC

## 2019-10-21 MED ORDER — GABAPENTIN 800 MG PO TABS: 800.0000 mg | ORAL_TABLET | Freq: Three times a day (TID) | ORAL | 3 refills | Status: DC

## 2019-10-21 NOTE — Telephone Encounter (Signed)
Refilled patient's medications as below    - Morphine Sulfate ER 30 MG T12A; Take 30 mg by mouth 3 (three) times daily as needed.  Dispense: 90 tablet; Refill: 0 - carisoprodol (SOMA) 350 MG tablet; Take 1 tablet (350 mg total) by mouth 3 (three) times daily as needed for muscle spasms.  Dispense: 90 tablet; Refill: 0 - gabapentin (NEURONTIN) 800 MG tablet; Take 1 tablet (800 mg total) by mouth in the morning, at noon, and at bedtime.  Dispense: 90 tablet; Refill: 3 - cloNIDine (CATAPRES) 0.1 MG tablet; Take 2 tablets (0.2 mg total) by mouth at bedtime.  Dispense: 60 tablet; Refill: 1   I will have patient scheduled and would like to start taper of carisoprodol. Taper as follows:   Soma 350 TID for 3 days ( current dose) Soma 350 BID for 3 days  Soma 350 QD for 3 days   Patient also on Clonidine. I will message his psychiatrist to discuss his thoughts on this medication going forward.

## 2019-10-21 NOTE — Telephone Encounter (Signed)
-----   Message from Albertha Ghee, MD sent at 10/21/2019  3:41 PM EDT ----- Regarding: appointment - not emergent I would like to have [name] schedule in the near future with me or a blue team member. Preferably myself given I am going to start tapering one of his medications at this visit.

## 2019-10-23 NOTE — Telephone Encounter (Signed)
Pt appt with Dr. Barbaraann Faster 10/28/2019 @ 3:15.

## 2019-10-28 ENCOUNTER — Other Ambulatory Visit: Payer: Self-pay

## 2019-10-28 ENCOUNTER — Ambulatory Visit: Payer: 59 | Admitting: Internal Medicine

## 2019-10-28 ENCOUNTER — Encounter: Payer: Self-pay | Admitting: Internal Medicine

## 2019-10-28 ENCOUNTER — Encounter: Payer: 59 | Admitting: Internal Medicine

## 2019-10-28 VITALS — BP 136/81 | HR 72 | Temp 97.7°F | Ht 73.0 in | Wt 217.9 lb

## 2019-10-28 DIAGNOSIS — M5441 Lumbago with sciatica, right side: Secondary | ICD-10-CM | POA: Diagnosis not present

## 2019-10-28 DIAGNOSIS — M5442 Lumbago with sciatica, left side: Secondary | ICD-10-CM

## 2019-10-28 DIAGNOSIS — K921 Melena: Secondary | ICD-10-CM | POA: Diagnosis not present

## 2019-10-28 DIAGNOSIS — F17210 Nicotine dependence, cigarettes, uncomplicated: Secondary | ICD-10-CM

## 2019-10-28 DIAGNOSIS — G8929 Other chronic pain: Secondary | ICD-10-CM

## 2019-10-28 DIAGNOSIS — Z720 Tobacco use: Secondary | ICD-10-CM

## 2019-10-28 NOTE — Patient Instructions (Addendum)
Thank you for trusting me with your care. To recap, today we discussed the following:   Taper of Soma as follows:  Soma 350 TID for 3 days ( current dose) Soma 350 BID for 3 days  Soma 350 QD for 3 days  Urine screen today.

## 2019-10-28 NOTE — Progress Notes (Signed)
   CC: chronic back pain   HPI:Mr.Braddock Judie Petit Tinnell is a 54 y.o. male who presents for evaluation of chronic back pain. Please see individual problem based A/P for details.   Past Medical History:  Diagnosis Date  . Anxiety   . Chronic back pain   . GERD (gastroesophageal reflux disease)   . Hypertension    prior, was on medication for a few months in the past  . Lumbar disc herniation   . OCD (obsessive compulsive disorder)   . Sciatica    Review of Systems:   Review of Systems  Eyes: Negative for blurred vision and double vision.  Cardiovascular: Negative for chest pain and leg swelling.     Physical Exam: Vitals:   10/28/19 1547  BP: 136/81  Pulse: 72  Temp: 97.7 F (36.5 C)  TempSrc: Oral  SpO2: 99%  Weight: 217 lb 14.4 oz (98.8 kg)  Height: 6\' 1"  (1.854 m)     General: NAD, nl appearance HEENT: Normocephalic, atraumatic , Conjunctiva nl  Cardiovascular: Normal rate, regular rhythm.  No murmurs, rubs, or gallops Pulmonary : Equal breath sounds, No wheezes, rales, or rhonchi Abdominal: soft, nontender,  bowel sounds present   Assessment & Plan:   See Encounters Tab for problem based charting.  Patient discussed with Dr. 

## 2019-10-29 ENCOUNTER — Encounter: Payer: Self-pay | Admitting: Internal Medicine

## 2019-10-29 NOTE — Progress Notes (Signed)
Internal Medicine Clinic Attending  Case discussed with Dr. Steen  At the time of the visit.  We reviewed the resident's history and exam and pertinent patient test results.  I agree with the assessment, diagnosis, and plan of care documented in the resident's note.  

## 2019-10-29 NOTE — Assessment & Plan Note (Signed)
Patient here for follow up of chronic back pain and to discuss taper of Closiprodol (Soma) . I discussed this medication is not recommended for long term use and we will need to taper him off. We will continue prescribing the Morphine and Gabapentin for his chronic pain.  Plan: Taper of Soma as follows:  Soma 350 TID for 3 days ( current dose) Soma 350 BID for 3 days  Soma 350 QD for 3 days Stop Soma Continue Morphine 30 mg ER - not due for refill today Toxassure today Continue Gabapentin 800 mg 3 times a day

## 2019-10-29 NOTE — Assessment & Plan Note (Signed)
Patient denies any recent Melena , abdominal pain, or constipation .He was contacted by GI but has not made an appointment. Reports he will follow up with previous referral placed for colonoscopy.

## 2019-10-29 NOTE — Assessment & Plan Note (Signed)
Continues to smoke ~ 1ppd, has cut back on some days to less.

## 2019-10-31 LAB — TOXASSURE SELECT,+ANTIDEPR,UR

## 2019-11-18 ENCOUNTER — Other Ambulatory Visit: Payer: Self-pay

## 2019-11-18 ENCOUNTER — Telehealth (INDEPENDENT_AMBULATORY_CARE_PROVIDER_SITE_OTHER): Payer: 59 | Admitting: Psychiatry

## 2019-11-18 ENCOUNTER — Encounter (HOSPITAL_COMMUNITY): Payer: Self-pay | Admitting: Psychiatry

## 2019-11-18 VITALS — Wt 217.0 lb

## 2019-11-18 DIAGNOSIS — F419 Anxiety disorder, unspecified: Secondary | ICD-10-CM | POA: Diagnosis not present

## 2019-11-18 DIAGNOSIS — F319 Bipolar disorder, unspecified: Secondary | ICD-10-CM | POA: Diagnosis not present

## 2019-11-18 DIAGNOSIS — R41840 Attention and concentration deficit: Secondary | ICD-10-CM | POA: Diagnosis not present

## 2019-11-18 MED ORDER — LAMOTRIGINE 100 MG PO TABS: 100.0000 mg | ORAL_TABLET | Freq: Every day | ORAL | 2 refills | Status: DC

## 2019-11-18 MED ORDER — AMPHETAMINE-DEXTROAMPHETAMINE 20 MG PO TABS: 20.0000 mg | ORAL_TABLET | Freq: Every morning | ORAL | 0 refills | Status: DC

## 2019-11-18 MED ORDER — QUETIAPINE FUMARATE 100 MG PO TABS: ORAL_TABLET | ORAL | 1 refills | Status: DC

## 2019-11-18 NOTE — Progress Notes (Signed)
Virtual Visit via Telephone Note  I connected with Roberto Blair on 11/18/19 at 11:00 AM EDT by telephone and verified that I am speaking with the correct person using two identifiers.  Location: Patient: work Provider: home office   I discussed the limitations, risks, security and privacy concerns of performing an evaluation and management service by telephone and the availability of in person appointments. I also discussed with the patient that there may be a patient responsible charge related to this service. The patient expressed understanding and agreed to proceed.   History of Present Illness: Patient is evaluated by phone session.  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  Patient is working as a Curator in a shop.  He likes his job but sometime he feels overwhelmed.  Recently he seen his physician who is trying to adjust his pain medicine.  He is no longer taking Klonopin but continues clonidine.  His pain medicine is also cut down from the past and he is okay with the plan.  He is sleeping 5 to 6 hours.  He is now taking Seroquel most nights 300 mg.  He like to increase dose of Lamictal which was done on the last visit.  He still has some irritability and mood swings but he feels the medicine is helping.  He has no rash, itching tremors or shakes.  He lives with his wife who is supportive.  Lately his wife has been coughing and he is concerned that she may have COVID.  He wants his wife to have COVID testing.  Patient denies drinking or using any illegal substances.  He is taking Adderall in the morning which helps his focus, energy, attention and multitasking.  Denies any psychosis, hallucination.  Denies drinking or using any illegal substances.  His appetite is okay.      Past Psychiatric History: H/Oanxiety, insomnia, mood swings and irritability. Prescribed Seroquel and Klonopin by Dr. Elna Breslow and  later Adderall was added by PCP Dr. Abigail Butts in [place]. Klonopin was switched to clonidine by PCP. We tried Wellbutrin but that did not help him.  No h/oinpatient, suicidal attempt, psychosis.   Physical Exam  Review of Systems  Weight 217 lb (98.4 kg).There is no height or weight on file to calculate BMI.  General Appearance: NA  Eye Contact:  NA  Speech:  Clear and Coherent  Volume:  Normal  Mood:  Anxious  Affect:  NA  Thought Process:  Goal Directed  Orientation:  Full (Time, Place, and Person)  Thought Content:  Rumination  Suicidal Thoughts:  No  Homicidal Thoughts:  No  Memory:  Immediate;   Good Recent;   Good Remote;   Good  Judgement:  Intact  Insight:  Present  Psychomotor Activity:  NA  Concentration:  Concentration: Fair and Attention Span: Fair  Recall:  Good  Fund of Knowledge:  Good  Language:  Good  Akathisia:  No  Handed:  Right  AIMS (if indicated):     Assets:  Communication Skills Desire for Improvement Housing Resilience Social Support Talents/Skills Transportation  ADL's:  Intact  Cognition:  WNL  Sleep:   5-7 hrs      Assessment and Plan: Bipolar disorder type I.   ADHD.  Anxiety.  I reviewed notes from his physician.  He has been doing better since taking most of the night Seroquel 300 mg and we increased Lamictal on the  last visit to 100 mg.  He has residual mood lability but he is more concerned about his future job.  He has no rash, itching tremors or shakes.  Continue Lamictal 100 mg daily, Seroquel up to 300 mg at bedtime and Adderall 20 mg in the morning.  Discussed medication side effects and benefits.  He is no longer taking Klonopin but is still taking clonidine from his physician.  Recommended to call us back if is any question or any concern.  Follow-up in 3 months.    Follow Up Instructions:    I discussed the assessment and treatment plan with the patient. The patient was provided an opportunity to ask questions and  all were answered. The patient agreed with the plan and demonstrated an understanding of the instructions.   The patient was advised to call back or seek an in-person evaluation if the symptoms worsen or if the condition fails to improve as anticipated.  I provided 20 minutes of non-face-to-face time during this encounter.   Cleotis Nipper, MD

## 2019-11-19 ENCOUNTER — Encounter: Payer: Self-pay | Admitting: Internal Medicine

## 2019-11-20 ENCOUNTER — Other Ambulatory Visit: Payer: Self-pay | Admitting: Internal Medicine

## 2019-11-20 DIAGNOSIS — F5104 Psychophysiologic insomnia: Secondary | ICD-10-CM

## 2019-11-20 DIAGNOSIS — G8929 Other chronic pain: Secondary | ICD-10-CM

## 2019-11-20 MED ORDER — CLONIDINE HCL 0.1 MG PO TABS: 0.2000 mg | ORAL_TABLET | Freq: Every day | ORAL | 1 refills | Status: DC

## 2019-11-20 MED ORDER — MORPHINE SULFATE ER 30 MG PO T12A: 30.0000 mg | EXTENDED_RELEASE_TABLET | Freq: Three times a day (TID) | ORAL | 0 refills | Status: DC | PRN

## 2019-11-20 NOTE — Progress Notes (Signed)
Pt requesting refill on morphine, clonidine and soma. LOV with PCP was 8/31 with Dr. Barbaraann Faster. Chart review indicates that he and Ayodeji discussed discontinuing the soma. Last UDS was appropriate in August. PDMP reviewed. Plan: refills sent for the morphine and clonidine. Will not refill soma as indicated in PCP's note.  Elige Radon, MD Internal Medicine Resident PGY-2 [facility] Pager: [phone] 11/20/2019 8:50 AM

## 2019-12-17 ENCOUNTER — Encounter: Payer: Self-pay | Admitting: Internal Medicine

## 2019-12-18 ENCOUNTER — Encounter: Payer: Self-pay | Admitting: Internal Medicine

## 2019-12-19 ENCOUNTER — Other Ambulatory Visit: Payer: Self-pay | Admitting: Student

## 2019-12-19 DIAGNOSIS — M5440 Lumbago with sciatica, unspecified side: Secondary | ICD-10-CM

## 2019-12-19 DIAGNOSIS — G8929 Other chronic pain: Secondary | ICD-10-CM

## 2019-12-19 MED ORDER — MORPHINE SULFATE ER 30 MG PO T12A: 30.0000 mg | EXTENDED_RELEASE_TABLET | Freq: Three times a day (TID) | ORAL | 0 refills | Status: DC | PRN

## 2019-12-22 ENCOUNTER — Telehealth (HOSPITAL_COMMUNITY): Payer: Self-pay | Admitting: *Deleted

## 2019-12-22 DIAGNOSIS — R41840 Attention and concentration deficit: Secondary | ICD-10-CM

## 2019-12-22 MED ORDER — AMPHETAMINE-DEXTROAMPHETAMINE 20 MG PO TABS: 20.0000 mg | ORAL_TABLET | Freq: Every morning | ORAL | 0 refills | Status: DC

## 2019-12-22 NOTE — Telephone Encounter (Signed)
Send to [employer].

## 2019-12-22 NOTE — Telephone Encounter (Signed)
Pt called requesting refill of the Adderall 20mg . Pt has an appointment on 02/16/20. Please review.

## 2020-01-18 ENCOUNTER — Encounter: Payer: Self-pay | Admitting: Internal Medicine

## 2020-01-19 ENCOUNTER — Other Ambulatory Visit: Payer: Self-pay | Admitting: *Deleted

## 2020-01-19 DIAGNOSIS — G8929 Other chronic pain: Secondary | ICD-10-CM

## 2020-01-19 DIAGNOSIS — M5440 Lumbago with sciatica, unspecified side: Secondary | ICD-10-CM

## 2020-01-19 DIAGNOSIS — R41840 Attention and concentration deficit: Secondary | ICD-10-CM

## 2020-01-19 DIAGNOSIS — F319 Bipolar disorder, unspecified: Secondary | ICD-10-CM

## 2020-01-19 DIAGNOSIS — F5104 Psychophysiologic insomnia: Secondary | ICD-10-CM

## 2020-01-19 MED ORDER — PANTOPRAZOLE SODIUM 40 MG PO TBEC
40.0000 mg | DELAYED_RELEASE_TABLET | Freq: Every day | ORAL | 1 refills | Status: DC
Start: 2020-01-19 — End: 2020-06-15

## 2020-01-19 MED ORDER — AMPHETAMINE-DEXTROAMPHETAMINE 20 MG PO TABS: 20.0000 mg | ORAL_TABLET | Freq: Every morning | ORAL | 0 refills | Status: DC

## 2020-01-19 MED ORDER — ANORO ELLIPTA 62.5-25 MCG/INH IN AEPB
1.0000 | INHALATION_SPRAY | Freq: Every day | RESPIRATORY_TRACT | 1 refills | Status: DC | PRN
Start: 2020-01-19 — End: 2020-02-16

## 2020-01-19 MED ORDER — MORPHINE SULFATE ER 30 MG PO T12A
30.0000 mg | EXTENDED_RELEASE_TABLET | Freq: Three times a day (TID) | ORAL | 0 refills | Status: DC | PRN
Start: 2020-01-19 — End: 2020-02-16

## 2020-01-19 MED ORDER — GABAPENTIN 800 MG PO TABS: 800.0000 mg | ORAL_TABLET | Freq: Three times a day (TID) | ORAL | 5 refills | Status: DC

## 2020-01-19 MED ORDER — CLONIDINE HCL 0.1 MG PO TABS: 0.2000 mg | ORAL_TABLET | Freq: Every day | ORAL | 5 refills | Status: DC

## 2020-01-19 MED ORDER — ALBUTEROL SULFATE HFA 108 (90 BASE) MCG/ACT IN AERS: 2.0000 | INHALATION_SPRAY | Freq: Four times a day (QID) | RESPIRATORY_TRACT | 1 refills | Status: DC | PRN

## 2020-01-19 NOTE — Telephone Encounter (Signed)
Will send refill. Patient should follow up with his psychiatrist for further refills (has an appointment on 02/16/20.)

## 2020-02-04 ENCOUNTER — Encounter (HOSPITAL_COMMUNITY): Payer: Self-pay | Admitting: Psychiatry

## 2020-02-04 ENCOUNTER — Other Ambulatory Visit: Payer: Self-pay

## 2020-02-04 ENCOUNTER — Telehealth (INDEPENDENT_AMBULATORY_CARE_PROVIDER_SITE_OTHER): Payer: 59 | Admitting: Psychiatry

## 2020-02-04 DIAGNOSIS — F319 Bipolar disorder, unspecified: Secondary | ICD-10-CM

## 2020-02-04 DIAGNOSIS — R41840 Attention and concentration deficit: Secondary | ICD-10-CM

## 2020-02-04 MED ORDER — LAMOTRIGINE 100 MG PO TABS: 100.0000 mg | ORAL_TABLET | Freq: Every day | ORAL | 2 refills | Status: DC

## 2020-02-04 MED ORDER — AMPHETAMINE-DEXTROAMPHETAMINE 20 MG PO TABS: 20.0000 mg | ORAL_TABLET | Freq: Every morning | ORAL | 0 refills | Status: DC

## 2020-02-04 MED ORDER — QUETIAPINE FUMARATE 100 MG PO TABS: ORAL_TABLET | ORAL | 1 refills | Status: DC

## 2020-02-04 NOTE — Progress Notes (Signed)
Virtual Visit via Telephone Note  I connected with Roberto Blair on 02/04/20 at 11:40 AM EST by telephone and verified that I am speaking with the correct person using two identifiers.  Location: Patient: Home Provider: Home Office   I discussed the limitations, risks, security and privacy concerns of performing an evaluation and management service by telephone and the availability of in person appointments. I also discussed with the patient that there may be a patient responsible charge related to this service. The patient expressed understanding and agreed to proceed.   History of Present Illness: Patient is evaluated by phone session.  He is on the phone by himself.  He is taking his medication as prescribed.  He tried to keep himself busy at work.  He admitted sometimes frustrated and irritability with a coworker but denies any recent mania, anger, aggression.  He is sleeping okay.  His attention and concentration is good.  He takes the Adderall 7 days a week because he works 7 days a week.  He denies any panic attack.  He has no tremors, shakes or any EPS.  He denies any suicidal thoughts, crying spells.  He like to keep his current medication.  His weight is stable.  He lives with his wife.  He had a good Thanksgiving and he is looking forward for Christmas.  Past Psychiatric History: H/Oanxiety, insomnia, mood swings and irritability. Prescribed Seroquel and Klonopin by Dr. Elna Breslow and later Adderall was added by PCP Dr. Abigail Butts in [place]. Klonopin was switched to clonidine by PCP. We tried Wellbutrin but that did not help him.No h/oinpatient, suicidal attempt, psychosis.   Psychiatric Specialty Exam: Physical Exam  Review of Systems  Weight 216 lb (98 kg).There is no height or weight on file to calculate BMI.  General Appearance: NA  Eye Contact:  NA  Speech:  Normal Rate  Volume:  Normal  Mood:  Euthymic  Affect:  NA  Thought Process:  Goal Directed   Orientation:  Full (Time, Place, and Person)  Thought Content:  Logical  Suicidal Thoughts:  No  Homicidal Thoughts:  No  Memory:  Immediate;   Good Recent;   Good Remote;   Good  Judgement:  Intact  Insight:  Present  Psychomotor Activity:  NA  Concentration:  Concentration: Good and Attention Span: Good  Recall:  Good  Fund of Knowledge:  Good  Language:  Good  Akathisia:  No  Handed:  Right  AIMS (if indicated):     Assets:  Communication Skills Desire for Improvement Housing Resilience Social Support Talents/Skills Transportation  ADL's:  Intact  Cognition:  WNL  Sleep:   ok      Assessment and Plan: Bipolar disorder type I.  ADHD.  Patient is a stable on his current medication.  He takes Seroquel 200-300 mg at bedtime and he noticed improvement with the Lamictal dose.  He is now taking 100 mg and he reported no tremors, shakes or any EPS.  Continue Lamictal 100 mg daily, Seroquel up to 300 mg at bedtime and Adderall 20 mg in the morning.  He is taking clonidine from his PCP.  Discussed medication side effects and benefits.  Recommended to call us back if is any question or any concern.  Follow-up in 3 months.  Follow Up Instructions:    I discussed the assessment and treatment plan with the patient. The patient was provided an opportunity to ask questions and all were answered. The patient agreed with the plan and  demonstrated an understanding of the instructions.   The patient was advised to call back or seek an in-person evaluation if the symptoms worsen or if the condition fails to improve as anticipated.  I provided 17 minutes of non-face-to-face time during this encounter.   Cleotis Nipper, MD

## 2020-02-12 ENCOUNTER — Telehealth (HOSPITAL_COMMUNITY): Payer: 59 | Admitting: Psychiatry

## 2020-02-16 ENCOUNTER — Encounter: Payer: Self-pay | Admitting: Internal Medicine

## 2020-02-16 ENCOUNTER — Other Ambulatory Visit: Payer: Self-pay | Admitting: *Deleted

## 2020-02-16 ENCOUNTER — Telehealth (HOSPITAL_COMMUNITY): Payer: 59 | Admitting: Psychiatry

## 2020-02-16 DIAGNOSIS — G8929 Other chronic pain: Secondary | ICD-10-CM

## 2020-02-16 DIAGNOSIS — M5440 Lumbago with sciatica, unspecified side: Secondary | ICD-10-CM

## 2020-02-16 MED ORDER — ALBUTEROL SULFATE HFA 108 (90 BASE) MCG/ACT IN AERS: 2.0000 | INHALATION_SPRAY | Freq: Four times a day (QID) | RESPIRATORY_TRACT | 1 refills | Status: DC | PRN

## 2020-02-16 MED ORDER — MORPHINE SULFATE ER 30 MG PO T12A: 30.0000 mg | EXTENDED_RELEASE_TABLET | Freq: Three times a day (TID) | ORAL | 0 refills | Status: DC | PRN

## 2020-02-16 MED ORDER — ANORO ELLIPTA 62.5-25 MCG/INH IN AEPB: 1.0000 | INHALATION_SPRAY | Freq: Every day | RESPIRATORY_TRACT | 1 refills | Status: DC | PRN

## 2020-03-16 ENCOUNTER — Encounter: Payer: Self-pay | Admitting: Internal Medicine

## 2020-03-16 DIAGNOSIS — G8929 Other chronic pain: Secondary | ICD-10-CM

## 2020-03-16 DIAGNOSIS — R41840 Attention and concentration deficit: Secondary | ICD-10-CM

## 2020-03-17 MED ORDER — AMPHETAMINE-DEXTROAMPHETAMINE 20 MG PO TABS: 20.0000 mg | ORAL_TABLET | Freq: Every morning | ORAL | 0 refills | Status: DC

## 2020-03-17 MED ORDER — MORPHINE SULFATE ER 30 MG PO T12A: 30.0000 mg | EXTENDED_RELEASE_TABLET | Freq: Three times a day (TID) | ORAL | 0 refills | Status: DC | PRN

## 2020-04-18 ENCOUNTER — Encounter: Payer: Self-pay | Admitting: Internal Medicine

## 2020-04-19 ENCOUNTER — Telehealth: Payer: Self-pay | Admitting: Internal Medicine

## 2020-04-19 ENCOUNTER — Encounter: Payer: Self-pay | Admitting: *Deleted

## 2020-04-19 DIAGNOSIS — G8929 Other chronic pain: Secondary | ICD-10-CM

## 2020-04-19 DIAGNOSIS — R41840 Attention and concentration deficit: Secondary | ICD-10-CM

## 2020-04-19 DIAGNOSIS — M5440 Lumbago with sciatica, unspecified side: Secondary | ICD-10-CM

## 2020-04-19 DIAGNOSIS — F319 Bipolar disorder, unspecified: Secondary | ICD-10-CM

## 2020-04-19 MED ORDER — MORPHINE SULFATE ER 30 MG PO T12A: 30.0000 mg | EXTENDED_RELEASE_TABLET | Freq: Three times a day (TID) | ORAL | 0 refills | Status: DC | PRN

## 2020-04-19 MED ORDER — GABAPENTIN 800 MG PO TABS: 800.0000 mg | ORAL_TABLET | Freq: Three times a day (TID) | ORAL | 5 refills | Status: DC

## 2020-04-19 NOTE — Telephone Encounter (Signed)
My Chart message sent to pt to contact Dr Sheela Stack office about Adderall refill.

## 2020-04-19 NOTE — Telephone Encounter (Signed)
Refilled Morphine. Dr.Arfeen is managing Adderall and refill request should be submitted to his office.

## 2020-04-19 NOTE — Telephone Encounter (Signed)
Refilled Gabapentin. Dr,Arfeen is managing Lamictal , Seroquel , and Adderall.Clonidine should also be under Dr. Lolly Mustache, but according to his notes he may be deferring Clonidine to our office. This was originally started by his former PCP. Clonidine is used for multiple medical conditions.  I recommend Roberto Blair discuss this medicine with Dr.Arfeen at his appointment in March if this is for ADHD. We will switch this medication to another appropriate medication in the future if it is for blood pressure.

## 2020-04-28 ENCOUNTER — Encounter (HOSPITAL_COMMUNITY): Payer: Self-pay | Admitting: Psychiatry

## 2020-04-28 ENCOUNTER — Other Ambulatory Visit: Payer: Self-pay

## 2020-04-28 ENCOUNTER — Telehealth (INDEPENDENT_AMBULATORY_CARE_PROVIDER_SITE_OTHER): Payer: 59 | Admitting: Psychiatry

## 2020-04-28 DIAGNOSIS — F319 Bipolar disorder, unspecified: Secondary | ICD-10-CM

## 2020-04-28 DIAGNOSIS — R41840 Attention and concentration deficit: Secondary | ICD-10-CM

## 2020-04-28 MED ORDER — LAMOTRIGINE 150 MG PO TABS: 150.0000 mg | ORAL_TABLET | Freq: Every day | ORAL | 1 refills | Status: DC

## 2020-04-28 MED ORDER — QUETIAPINE FUMARATE 300 MG PO TABS: ORAL_TABLET | ORAL | 1 refills | Status: DC

## 2020-04-28 NOTE — Progress Notes (Signed)
Virtual Visit via Telephone Note  I connected with Roberto Blair on 04/28/20 at 11:20 AM EST by telephone and verified that I am speaking with the correct person using two identifiers.  Location: Patient: Home Provider: Home Office   I discussed the limitations, risks, security and privacy concerns of performing an evaluation and management service by telephone and the availability of in person appointments. I also discussed with the patient that there may be a patient responsible charge related to this service. The patient expressed understanding and agreed to proceed.   History of Present Illness: Patient is evaluated by phone session.  His wife is also present on the phone.  Patient noticed lately having irritability, frustration and having delusions at work.  He gets easily frustrated and upset with the coworkers.  Patient works as a Curator.  He also noticed sometimes sleeping not as good.  He denies any panic attack.  Denies any suicidal thoughts, crying spells or any feeling of hopelessness or worthlessness.  His weight is stable.  His vacations were okay.  He has no rash, itching, tremors or shakes.  Recently his Adderall was filled by his internist.  He is taking clonidine from his PCP which she described that he is taking for blood pressure.  Patient has no balance or aggression.  He is not drinking or using any illegal substances.  He lives with his wife who is supportive.    Past Psychiatric History: H/Oanxiety, insomnia, mood swings and irritability. Prescribed Seroquel and Klonopin by Dr. Elna Breslow and later Adderall was added by PCP Dr. Abigail Butts in [place]. Klonopin was switched to clonidine by PCP. We tried Wellbutrin but that did not help him.No h/oinpatient, suicidal attempt, psychosis.   Psychiatric Specialty Exam: Physical Exam  Review of Systems  Weight 216 lb (98 kg).There is no height or weight on file to calculate BMI.  General Appearance: NA  Eye Contact:   NA  Speech:  Normal Rate  Volume:  Normal  Mood:  Irritable and frustrated  Affect:  NA  Thought Process:  Descriptions of Associations: Intact  Orientation:  Full (Time, Place, and Person)  Thought Content:  Rumination  Suicidal Thoughts:  No  Homicidal Thoughts:  No  Memory:  Immediate;   Good Recent;   Good Remote;   Fair  Judgement:  Intact  Insight:  Present  Psychomotor Activity:  NA  Concentration:  Concentration: Fair and Attention Span: Fair  Recall:  [employer] of Knowledge:  Good  Language:  Good  Akathisia:  No  Handed:  Right  AIMS (if indicated):     Assets:  Communication Skills Desire for Improvement Housing Resilience Social Support Talents/Skills Transportation  ADL's:  Intact  Cognition:  WNL  Sleep:   fair      Assessment and Plan: Bipolar disorder type I.  ADHD.  I reviewed current medication.  Patient is taking pain medicine, gabapentin and his pain also sometimes aggravates his mood.  Recently his Adderall was given by his PCP.  I recommend to try Lamictal 150 mg daily and he should take the Seroquel 300 mg every night at bedtime.  Sometimes he does not take the Seroquel and takes 1-3 tablet.  I recommend compliance Seroquel 300 every night may help his mood irritability.  Also recommend increase Lamictal helped his irritability and if he noticed any rash or any itching then he can call us back immediately.  He will continue clonidine from his PCP which he takes for blood  pressure.  I recommended to call us back if he has any question or any concern.  Follow-up in 6 weeks.  Follow Up Instructions:    I discussed the assessment and treatment plan with the patient. The patient was provided an opportunity to ask questions and all were answered. The patient agreed with the plan and demonstrated an understanding of the instructions.   The patient was advised to call back or seek an in-person evaluation if the symptoms worsen or if the condition fails  to improve as anticipated.  I provided 20 minutes of non-face-to-face time during this encounter.   Cleotis Nipper, MD

## 2020-05-16 ENCOUNTER — Encounter: Payer: Self-pay | Admitting: Internal Medicine

## 2020-05-17 ENCOUNTER — Other Ambulatory Visit: Payer: Self-pay | Admitting: Internal Medicine

## 2020-05-17 DIAGNOSIS — M5440 Lumbago with sciatica, unspecified side: Secondary | ICD-10-CM

## 2020-05-17 DIAGNOSIS — M5441 Lumbago with sciatica, right side: Secondary | ICD-10-CM

## 2020-05-17 DIAGNOSIS — R41840 Attention and concentration deficit: Secondary | ICD-10-CM

## 2020-05-17 DIAGNOSIS — G8929 Other chronic pain: Secondary | ICD-10-CM

## 2020-05-17 MED ORDER — MORPHINE SULFATE ER 30 MG PO T12A: 30.0000 mg | EXTENDED_RELEASE_TABLET | Freq: Three times a day (TID) | ORAL | 0 refills | Status: DC | PRN

## 2020-05-17 MED ORDER — AMPHETAMINE-DEXTROAMPHETAMINE 20 MG PO TABS: 20.0000 mg | ORAL_TABLET | Freq: Every morning | ORAL | 0 refills | Status: DC

## 2020-06-08 ENCOUNTER — Telehealth (HOSPITAL_COMMUNITY): Payer: 59 | Admitting: Psychiatry

## 2020-06-09 ENCOUNTER — Other Ambulatory Visit: Payer: Self-pay

## 2020-06-09 ENCOUNTER — Encounter (HOSPITAL_COMMUNITY): Payer: Self-pay | Admitting: Psychiatry

## 2020-06-09 ENCOUNTER — Telehealth (INDEPENDENT_AMBULATORY_CARE_PROVIDER_SITE_OTHER): Payer: 59 | Admitting: Psychiatry

## 2020-06-09 DIAGNOSIS — R41840 Attention and concentration deficit: Secondary | ICD-10-CM | POA: Diagnosis not present

## 2020-06-09 DIAGNOSIS — F319 Bipolar disorder, unspecified: Secondary | ICD-10-CM

## 2020-06-09 MED ORDER — LAMOTRIGINE 150 MG PO TABS: 150.0000 mg | ORAL_TABLET | Freq: Every day | ORAL | 2 refills | Status: DC

## 2020-06-09 MED ORDER — QUETIAPINE FUMARATE 300 MG PO TABS: ORAL_TABLET | ORAL | 2 refills | Status: DC

## 2020-06-09 NOTE — Progress Notes (Signed)
Virtual Visit via Video Note  I connected with Roberto Blair on 06/09/20 at  1:20 PM EDT by a video enabled telemedicine application and verified that I am speaking with the correct person using two identifiers.  Location: Patient: Home Provider: Office   I discussed the limitations of evaluation and management by telemedicine and the availability of in person appointments. The patient expressed understanding and agreed to proceed.  History of Present Illness: Patient is evaluated by video session.  He has been doing much better since we adjusted the dose of Seroquel and Lamictal.  He is not getting as frustrated, irritable, having mood swings.  He is sleeping much better and denies any crying spells or any feeling of hopelessness.  He is tolerating the medication and reported no tremors shakes or any EPS.  He is also taking clonidine and Adderall by PCP.  He denies any dizziness, sedation.  He sleeps good since taking the Seroquel 300 mg at bedtime.  He denies drinking or using any illegal substances.  He lives with his wife who is supportive.   Past Psychiatric History: H/Oanxiety, insomnia, mood swings and irritability. Prescribed Seroquel and Klonopin by Dr. Elna Breslow. Adderall added by PCP Dr. Abigail Butts in [place]. Klonopin was switched to clonidine by PCP. We tried Wellbutrin but that did not help him.No h/oinpatient, suicidal attempt, psychosis.  Psychiatric Specialty Exam: Physical Exam  Review of Systems  Weight 216 lb (98 kg).Body mass index is 28.5 kg/m.  General Appearance: Casual  Eye Contact:  Good  Speech:  Clear and Coherent  Volume:  Normal  Mood:  Euthymic  Affect:  Congruent  Thought Process:  Goal Directed  Orientation:  Full (Time, Place, and Person)  Thought Content:  WDL  Suicidal Thoughts:  No  Homicidal Thoughts:  No  Memory:  Immediate;   Good Recent;   Good Remote;   Good  Judgement:  Intact  Insight:  Present  Psychomotor Activity:  Normal   Concentration:  Concentration: Good and Attention Span: Good  Recall:  Good  Fund of Knowledge:  Good  Language:  Good  Akathisia:  No  Handed:  Right  AIMS (if indicated):     Assets:  Communication Skills Desire for Improvement Housing Resilience Social Support Talents/Skills Transportation  ADL's:  Intact  Cognition:  WNL  Sleep:   good      Assessment and Plan: Bipolar disorder type I.  ADHD.  Patient doing better since Seroquel and Lamictal doses were increased.  He has no rash or any itching.  He is getting Adderall from his PCP which is helping his attention and focus.  Patient does not want to change the medication.  Discussed medication side effects and benefits.  Continue Seroquel 300 mg at bedtime and Lamictal 150 mg daily.  Recommended to call us back if is any question or any concern.  Follow-up in 3 months.  Follow Up Instructions:    I discussed the assessment and treatment plan with the patient. The patient was provided an opportunity to ask questions and all were answered. The patient agreed with the plan and demonstrated an understanding of the instructions.   The patient was advised to call back or seek an in-person evaluation if the symptoms worsen or if the condition fails to improve as anticipated.  I provided 16 minutes of non-face-to-face time during this encounter.   Cleotis Nipper, MD

## 2020-06-13 ENCOUNTER — Encounter: Payer: Self-pay | Admitting: Internal Medicine

## 2020-06-13 DIAGNOSIS — M5440 Lumbago with sciatica, unspecified side: Secondary | ICD-10-CM

## 2020-06-13 DIAGNOSIS — R41840 Attention and concentration deficit: Secondary | ICD-10-CM

## 2020-06-13 DIAGNOSIS — G8929 Other chronic pain: Secondary | ICD-10-CM

## 2020-06-14 ENCOUNTER — Other Ambulatory Visit: Payer: Self-pay | Admitting: Student

## 2020-06-15 MED ORDER — PANTOPRAZOLE SODIUM 40 MG PO TBEC: 40.0000 mg | DELAYED_RELEASE_TABLET | Freq: Every day | ORAL | 1 refills | Status: DC

## 2020-06-15 MED ORDER — AMPHETAMINE-DEXTROAMPHETAMINE 20 MG PO TABS: 20.0000 mg | ORAL_TABLET | Freq: Every morning | ORAL | 0 refills | Status: DC

## 2020-06-15 MED ORDER — MORPHINE SULFATE ER 30 MG PO T12A: 30.0000 mg | EXTENDED_RELEASE_TABLET | Freq: Three times a day (TID) | ORAL | 0 refills | Status: DC | PRN

## 2020-07-13 ENCOUNTER — Encounter: Payer: Self-pay | Admitting: Internal Medicine

## 2020-07-13 DIAGNOSIS — M5440 Lumbago with sciatica, unspecified side: Secondary | ICD-10-CM

## 2020-07-13 DIAGNOSIS — F5104 Psychophysiologic insomnia: Secondary | ICD-10-CM

## 2020-07-13 DIAGNOSIS — G8929 Other chronic pain: Secondary | ICD-10-CM

## 2020-07-13 DIAGNOSIS — R41840 Attention and concentration deficit: Secondary | ICD-10-CM

## 2020-07-14 MED ORDER — CLONIDINE HCL 0.1 MG PO TABS: 0.2000 mg | ORAL_TABLET | Freq: Every day | ORAL | 5 refills | Status: DC

## 2020-07-14 MED ORDER — MORPHINE SULFATE ER 30 MG PO T12A: 30.0000 mg | EXTENDED_RELEASE_TABLET | Freq: Three times a day (TID) | ORAL | 0 refills | Status: DC | PRN

## 2020-07-14 MED ORDER — ANORO ELLIPTA 62.5-25 MCG/INH IN AEPB: 1.0000 | INHALATION_SPRAY | Freq: Every day | RESPIRATORY_TRACT | 1 refills | Status: DC | PRN

## 2020-07-14 MED ORDER — ALBUTEROL SULFATE HFA 108 (90 BASE) MCG/ACT IN AERS: 2.0000 | INHALATION_SPRAY | Freq: Four times a day (QID) | RESPIRATORY_TRACT | 1 refills | Status: DC | PRN

## 2020-07-14 NOTE — Telephone Encounter (Signed)
Mistakenly sent message via MyChart that said PCP would be in clinic June and September. Left detailed message on patient's self-identified VM apologizing and explaining that PCP is here through the end of May and the month of August. Also, relayed the 5 meds that have been sent to PCP for refill. Requested return call for any questions.

## 2020-07-15 ENCOUNTER — Telehealth: Payer: Self-pay | Admitting: *Deleted

## 2020-07-15 NOTE — Telephone Encounter (Addendum)
Information was sent to [employer] per CoverMyMeds for PA for Morphine Sulfate 30 mg ER.  Awaiting determination with 24 hours.   [phone].  Angelina Ok, RN 07/15/2020 1:27 PM. Call to [facility] concerning need for PA for Morphine 30 mg tablets.  Patient has paid for and no PA is needed.  Angelina Ok, RN 07/19/2020 10:09 AM

## 2020-07-21 ENCOUNTER — Encounter: Payer: Self-pay | Admitting: Internal Medicine

## 2020-07-21 ENCOUNTER — Other Ambulatory Visit: Payer: Self-pay

## 2020-07-21 ENCOUNTER — Ambulatory Visit (INDEPENDENT_AMBULATORY_CARE_PROVIDER_SITE_OTHER): Payer: 59 | Admitting: Internal Medicine

## 2020-07-21 ENCOUNTER — Ambulatory Visit (HOSPITAL_COMMUNITY)
Admission: RE | Admit: 2020-07-21 | Discharge: 2020-07-21 | Disposition: A | Discharge status: Home / Self Care | Payer: 59 | Source: Ambulatory Visit | Attending: Internal Medicine | Admitting: Internal Medicine

## 2020-07-21 VITALS — BP 144/80 | HR 80 | Temp 98.4°F | Ht 73.0 in | Wt 225.5 lb

## 2020-07-21 DIAGNOSIS — M79645 Pain in left finger(s): Secondary | ICD-10-CM | POA: Diagnosis not present

## 2020-07-21 DIAGNOSIS — M5440 Lumbago with sciatica, unspecified side: Secondary | ICD-10-CM | POA: Diagnosis not present

## 2020-07-21 DIAGNOSIS — M79644 Pain in right finger(s): Secondary | ICD-10-CM | POA: Insufficient documentation

## 2020-07-21 DIAGNOSIS — G8929 Other chronic pain: Secondary | ICD-10-CM

## 2020-07-21 NOTE — Progress Notes (Signed)
   CC: bilateral thumb pain, chronic low back pain   HPI:[name] is a 55 y.o. male who presents for evaluation of bilateral thumb pain. Please see individual problem based A/P for details.  Past Medical History:  Diagnosis Date  . Anxiety   . Chronic back pain   . GERD (gastroesophageal reflux disease)   . Hypertension    prior, was on medication for a few months in the past  . Lumbar disc herniation   . OCD (obsessive compulsive disorder)   . Sciatica    Review of Systems:   Review of Systems  Constitutional: Negative for chills and fever.  Musculoskeletal: Positive for joint pain. Negative for falls and neck pain.  Neurological: Positive for weakness. Negative for sensory change.     Physical Exam: Vitals:   07/21/20 1316  BP: (!) 144/80  Pulse: 80  Temp: 98.4 F (36.9 C)  TempSrc: Oral  SpO2: 100%  Weight: 225 lb 8 oz (102.3 kg)  Height: 6\' 1"  (1.854 m)   General: Middle age man, raspy voice, NAD HEENT: Conjunctiva nl , antiicteric sclerae, moist mucous membranes, no exudate or erythema Cardiovascular: Normal rate, regular rhythm.  No murmurs, rubs, or gallops Pulmonary : Equal breath sounds, No wheezes, rales, or rhonchi Abdominal: soft, nontender,  bowel sounds present Ext: No tenderness on palpation of joints, mild weakness with bilateral grip, negative tinel and Phalen   Assessment & Plan:   See Encounters Tab for problem based charting.  Patient discussed with Dr. 

## 2020-07-21 NOTE — Patient Instructions (Signed)
Thank you for trusting me with your care. To recap, today we discussed the following:   1. Chronic bilateral low back pain with sciatica, sciatica laterality unspecified  - ToxAssure Select,+Antidepr,UR  2. Bilateral thumb pain  - DG Finger Thumb Left; Future - DG Finger Thumb Right; Future

## 2020-07-23 ENCOUNTER — Telehealth: Payer: Self-pay | Admitting: Internal Medicine

## 2020-07-23 DIAGNOSIS — M79644 Pain in right finger(s): Secondary | ICD-10-CM

## 2020-07-23 DIAGNOSIS — M79645 Pain in left finger(s): Secondary | ICD-10-CM

## 2020-07-23 MED ORDER — NAPROXEN 500 MG PO TABS: 500.0000 mg | ORAL_TABLET | Freq: Two times a day (BID) | ORAL | 0 refills | Status: AC

## 2020-07-23 NOTE — Telephone Encounter (Signed)
Bilateral thumb pain. Appreciate "allergy" to NSAIDS. Short course of Naprosyn is safe. This is listed because patient has chronic pain and NSAIDS could cause ulcers if used daily for extended period of time.  - Ambulatory referral to Sports Medicine - naproxen (NAPROSYN) 500 MG tablet; Take 1 tablet (500 mg total) by mouth 2 (two) times daily with a meal for 10 days.  Dispense: 20 tablet; Refill: 0

## 2020-07-25 ENCOUNTER — Encounter: Payer: Self-pay | Admitting: Internal Medicine

## 2020-07-25 DIAGNOSIS — M79644 Pain in right finger(s): Secondary | ICD-10-CM | POA: Insufficient documentation

## 2020-07-25 NOTE — Assessment & Plan Note (Signed)
Patient reports bilateral thumb pain. Exam suggest this could be osteoarthritis a MCP joints.   Assessment/Plan:  Bilateral thumb pain - DG Finger Thumb Left; Future - DG Finger Thumb Right; Future

## 2020-07-25 NOTE — Assessment & Plan Note (Addendum)
Patient is currently on Morphine and Gabapetin to manage his chronic low back pain. He has been compliant with contract and we will obtain UDS today. 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   Assessment/Plan:  Chronic bilateral low back pain with sciatica, on chronic opioid - Continue Morphine 30 mg ER TID - Continue Gabapentin 800 mg TID  - ToxAssure Select,+Antidepr,UR

## 2020-07-27 ENCOUNTER — Telehealth: Payer: Self-pay

## 2020-07-27 NOTE — Progress Notes (Signed)
Internal Medicine Clinic Attending  Case discussed with Dr. Steen  At the time of the visit.  We reviewed the resident's history and exam and pertinent patient test results.  I agree with the assessment, diagnosis, and plan of care documented in the resident's note.  

## 2020-07-27 NOTE — Telephone Encounter (Signed)
RTC to [place] at [employer], pharmacy tech states this is the pharmacist and she is at lunch until 1500. SChaplin, RN,BSN

## 2020-07-27 NOTE — Telephone Encounter (Signed)
Pharmacy tech([facility])is requesting a call back she stated she was speaking to someone about the Pt but does not have a name to whom she was speaking

## 2020-07-28 LAB — TOXASSURE SELECT,+ANTIDEPR,UR

## 2020-07-29 ENCOUNTER — Encounter: Payer: Self-pay | Admitting: Internal Medicine

## 2020-07-29 DIAGNOSIS — R41840 Attention and concentration deficit: Secondary | ICD-10-CM

## 2020-07-29 MED ORDER — AMPHETAMINE-DEXTROAMPHETAMINE 20 MG PO TABS: 20.0000 mg | ORAL_TABLET | Freq: Every morning | ORAL | 0 refills | Status: DC

## 2020-08-02 ENCOUNTER — Encounter: Payer: Self-pay | Admitting: *Deleted

## 2020-08-11 ENCOUNTER — Ambulatory Visit: Payer: 59 | Admitting: Family Medicine

## 2020-08-12 ENCOUNTER — Other Ambulatory Visit: Payer: Self-pay | Admitting: Internal Medicine

## 2020-08-12 DIAGNOSIS — R41840 Attention and concentration deficit: Secondary | ICD-10-CM

## 2020-08-13 ENCOUNTER — Encounter: Payer: Self-pay | Admitting: Internal Medicine

## 2020-08-13 ENCOUNTER — Other Ambulatory Visit: Payer: Self-pay | Admitting: Internal Medicine

## 2020-08-13 DIAGNOSIS — G8929 Other chronic pain: Secondary | ICD-10-CM

## 2020-08-13 DIAGNOSIS — M5440 Lumbago with sciatica, unspecified side: Secondary | ICD-10-CM

## 2020-08-13 MED ORDER — AMPHETAMINE-DEXTROAMPHETAMINE 20 MG PO TABS: 20.0000 mg | ORAL_TABLET | Freq: Every morning | ORAL | 0 refills | Status: DC

## 2020-08-13 NOTE — Telephone Encounter (Signed)
  gabapentin (NEURONTIN) 800 MG tablet  QUEtiapine (SEROQUEL) 300 MG tablet  amphetamine-dextroamphetamine (ADDERALL) 20 MG tablet   Morphine Sulfate ER 30 MG T12A, refill request @  [address] [facility] [id number] [place], [place] - [address] Phone:  [phone]  Fax:  [phone]

## 2020-08-14 ENCOUNTER — Other Ambulatory Visit: Payer: Self-pay | Admitting: Internal Medicine

## 2020-08-14 DIAGNOSIS — M5440 Lumbago with sciatica, unspecified side: Secondary | ICD-10-CM

## 2020-08-14 DIAGNOSIS — J439 Emphysema, unspecified: Secondary | ICD-10-CM

## 2020-08-14 DIAGNOSIS — G8929 Other chronic pain: Secondary | ICD-10-CM

## 2020-08-14 MED ORDER — ALBUTEROL SULFATE HFA 108 (90 BASE) MCG/ACT IN AERS: 2.0000 | INHALATION_SPRAY | Freq: Four times a day (QID) | RESPIRATORY_TRACT | 1 refills | Status: DC | PRN

## 2020-08-14 MED ORDER — GABAPENTIN 800 MG PO TABS: 800.0000 mg | ORAL_TABLET | Freq: Three times a day (TID) | ORAL | 5 refills | Status: DC

## 2020-08-15 ENCOUNTER — Other Ambulatory Visit: Payer: Self-pay | Admitting: Internal Medicine

## 2020-08-15 DIAGNOSIS — M5441 Lumbago with sciatica, right side: Secondary | ICD-10-CM

## 2020-08-15 DIAGNOSIS — G8929 Other chronic pain: Secondary | ICD-10-CM

## 2020-08-15 DIAGNOSIS — M5440 Lumbago with sciatica, unspecified side: Secondary | ICD-10-CM

## 2020-08-16 MED ORDER — MORPHINE SULFATE ER 30 MG PO T12A: 30.0000 mg | EXTENDED_RELEASE_TABLET | Freq: Three times a day (TID) | ORAL | 0 refills | Status: DC | PRN

## 2020-08-16 NOTE — Telephone Encounter (Signed)
Pt's wife is calling back to speak with a nurse about getting refill on  Morphine Sulfate ER 30 MG T12A.  Pt' wife would like a call back.

## 2020-08-16 NOTE — Telephone Encounter (Signed)
Patient states he spoke with Drs Karilyn Cota and Barbaraann Faster on the after hours line this weekend regarding refill for MS Contin. States [facility] has not yet received the Rx. Please send today.

## 2020-08-16 NOTE — Telephone Encounter (Signed)
Pt wife requesting to speak with a nurse about  Morphine Sulfate ER 30 MG T12A. States pt will run out of med by today. Requesting the med to be filled by today. Please call pt's wife.

## 2020-08-17 ENCOUNTER — Telehealth: Payer: Self-pay | Admitting: Internal Medicine

## 2020-08-17 NOTE — Telephone Encounter (Signed)
Duplicate request Morphine refilled yesterday No further action needed Phone call complete

## 2020-08-17 NOTE — Telephone Encounter (Signed)
After hours emergent line called for refills. Reviewed today and refilled on Monday by Dr.Christian.

## 2020-08-18 ENCOUNTER — Ambulatory Visit: Payer: 59 | Admitting: Family Medicine

## 2020-08-18 ENCOUNTER — Encounter: Payer: Self-pay | Admitting: Family Medicine

## 2020-08-18 ENCOUNTER — Other Ambulatory Visit: Payer: Self-pay

## 2020-08-18 VITALS — BP 144/70 | Ht 73.0 in | Wt 228.0 lb

## 2020-08-18 DIAGNOSIS — M6790 Unspecified disorder of synovium and tendon, unspecified site: Secondary | ICD-10-CM | POA: Insufficient documentation

## 2020-08-18 DIAGNOSIS — M79645 Pain in left finger(s): Secondary | ICD-10-CM

## 2020-08-18 DIAGNOSIS — M79644 Pain in right finger(s): Secondary | ICD-10-CM | POA: Diagnosis not present

## 2020-08-18 DIAGNOSIS — S66819A Strain of other specified muscles, fascia and tendons at wrist and hand level, unspecified hand, initial encounter: Secondary | ICD-10-CM | POA: Diagnosis not present

## 2020-08-18 NOTE — Progress Notes (Addendum)
   PCP: Albertha Ghee, MD  Subjective:   HPI: Patient is a 55 y.o. male here for evaluation of bilateral thumb pain.  He initially presented to his PCP, who suspected possible first MCP joint arthritis bilaterally and obtained x-rays on 07/21/2020, x-rays from the visit were relatively unremarkable.  He was then referred here for further evaluation. Pt reports bilateral thumb pain for the last 3 months. The pain is at the base of the thumb which extends to the hand. The pain is usually worse in the morning and the pain improves with movement. He is no longer able to hyperextend the thumbs as he was able to in the past.  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 It suddenly started with no apparent triggers. No injuries or falls. He takes MS contin and neurontin for his back pain which does not help with the pain. Pt is right handed.    Review of Systems:  Per HPI.   PMFSH, medications and smoking status reviewed.      Objective:  Physical Exam:  No flowsheet data found.   Gen: awake, alert, NAD, comfortable in exam room Pulm: breathing unlabored  Hand: Inspection: No obvious deformity. No swelling, erythema or bruising Palpation: no TTP ROM: Full ROM of the digits and wrist. Fully able to extend and flex finger. Strength: 5/5 strength in the forearm, wrist and interosseus muscles Neurovascular: NV intact  Fingers:  No swelling in PIP, DIP joints. Flexor digitorum profundus and superficialis tendon functions are intact.  PIP joint collateral ligaments are stable . Palpation: TTP on base of first MCP, PIP and DIP, mostly over the volar side of the thumb.  ROM: normal flexion, extension, adduction, abduction of first MCP. Unable to fully extend first MCP and IP due to pain Strength: 5/5 strength Neurovascular: NV intact  Assessment/Plan:  1. Flexor pollicis longus tendinopathy likely 2/2 overuse injury. Recommend-Voltaren gel TID to affect area, ice  therapy and also provided pt with a thumb splint to wear when he is not working. Discussed option of injecting steroid into the tendon sheath. Pt would like to try conservative management first. Follow up in 2 weeks and if no improvement in symptoms can consider steroid injection.   Towanda Octave, MD  I have seen and evaluated this patient independently, and agree with the resident's note as above. Guy Sandifer, MD Sports Medicine Fellow, [facility]   Guy Sandifer, MD [facility] Fellow 08/18/2020 1:13 PM  I was the preceptor for this visit and available for immediate consultation to both the sports medicine fellow and the resident Marsa Aris, DO

## 2020-08-18 NOTE — Patient Instructions (Signed)
Thank you for coming to see me today. It was a pleasure. Today we discussed your thumb pain. It is most likely a tendonitis which means inflammation of the tendon from repetitive use. I recommend: -Ice to the affected area a few times a day  -Voltaren gel 3 times a day  -Thumb splint when not working  Please follow-up in 2 weeks  If you have any questions or concerns, please do not hesitate to call the office.  Best wishes,   Dr Allena Katz

## 2020-08-19 ENCOUNTER — Telehealth: Payer: Self-pay | Admitting: *Deleted

## 2020-08-19 NOTE — Telephone Encounter (Addendum)
Information for PA for Morphine Sulfate was called to [facility] after fax from [facility] for PA .  Call to [facility] to inform them that information has been sent to [facility] for PA and will have a determination with 24 to 72 hours.  Informed that patient has already picked up medication at a charge of $22.  Angelina Ok, RN 08/19/2020 10:27 AM.  Valinda Hoar from [facility].  PA for Morphine Sulfate has been approved 08/20/2020 thru 08/19/2021 as long as patient is enrolled in the current plan.  Angelina Ok, RN 08/23/2020 10:50 AM.

## 2020-08-25 ENCOUNTER — Other Ambulatory Visit: Payer: Self-pay

## 2020-08-25 ENCOUNTER — Telehealth (HOSPITAL_COMMUNITY): Payer: 59 | Admitting: Psychiatry

## 2020-08-31 ENCOUNTER — Encounter: Payer: Self-pay | Admitting: *Deleted

## 2020-09-01 ENCOUNTER — Ambulatory Visit: Payer: 59 | Admitting: Family Medicine

## 2020-09-01 NOTE — Addendum Note (Signed)
Addended by: Neomia Dear on: 09/01/2020 05:51 PM   Modules accepted: Orders

## 2020-09-08 ENCOUNTER — Telehealth (HOSPITAL_COMMUNITY): Payer: Self-pay

## 2020-09-08 ENCOUNTER — Ambulatory Visit: Payer: 59 | Admitting: Family Medicine

## 2020-09-08 NOTE — Telephone Encounter (Signed)
This is Dr. Sheela Stack patient. Patient's wife called requesting a refill on his Lamotrigine 150mg  and his Quetiapine 300mg  to be sent to Pharmacy on [address] Thank you

## 2020-09-09 ENCOUNTER — Encounter: Payer: Self-pay | Admitting: Internal Medicine

## 2020-09-13 ENCOUNTER — Other Ambulatory Visit: Payer: Self-pay | Admitting: Internal Medicine

## 2020-09-13 DIAGNOSIS — R41840 Attention and concentration deficit: Secondary | ICD-10-CM

## 2020-09-13 DIAGNOSIS — G8929 Other chronic pain: Secondary | ICD-10-CM

## 2020-09-13 DIAGNOSIS — M5440 Lumbago with sciatica, unspecified side: Secondary | ICD-10-CM

## 2020-09-13 MED ORDER — MORPHINE SULFATE ER 30 MG PO T12A: 30.0000 mg | EXTENDED_RELEASE_TABLET | Freq: Three times a day (TID) | ORAL | 0 refills | Status: DC | PRN

## 2020-09-13 NOTE — Telephone Encounter (Signed)
Pt's wife following up on a previous My Chart Refill Request.  Per Marcelino Duster the pt's medication for the following medication was unable to be filled due to the Dr's DEA not being accepted:   amphetamine-dextroamphetamine (ADDERALL) 20 MG tablet  [address] [facility] [id number] - [place], [place] - [address] (Ph: [phone])   Pt's wife also states the following medication was also not called in with the rest of his refill's , and that the patient is leaving to go out of town tomorrow morning and needs his medication as soon as possible.   Morphine Sulfate ER 30 MG T12A  [facility] [id number] - [place], [place] - [address] (Ph: [phone])

## 2020-09-13 NOTE — Telephone Encounter (Signed)
Morphine Last rx written 08/16/20. Last OV 07/21/20. Next OV - has not been scheduled. UDS 07/21/20.  I will sent request to [facility].  I called [facility] about Adderall rx - pharm tech stated she will have the pharmacist to check Dr Luciana Axe DEA# (they have been having problems with some DEA's) and will call back if they have any problems.

## 2020-10-10 ENCOUNTER — Encounter: Payer: Self-pay | Admitting: Internal Medicine

## 2020-10-10 DIAGNOSIS — R41840 Attention and concentration deficit: Secondary | ICD-10-CM

## 2020-10-10 DIAGNOSIS — G8929 Other chronic pain: Secondary | ICD-10-CM

## 2020-10-12 MED ORDER — AMPHETAMINE-DEXTROAMPHETAMINE 20 MG PO TABS: 20.0000 mg | ORAL_TABLET | Freq: Every morning | ORAL | 0 refills | Status: DC

## 2020-10-12 MED ORDER — MORPHINE SULFATE ER 30 MG PO T12A: 30.0000 mg | EXTENDED_RELEASE_TABLET | Freq: Three times a day (TID) | ORAL | 0 refills | Status: DC | PRN

## 2020-10-12 NOTE — Telephone Encounter (Signed)
PDMP reviewed an appropriate. Review of notes show Psychiatry would like for our office to continue managing Adderall prescription.  Prescription of Adderall sent for 3 months.  Prescription for morphine sent for 1 month.

## 2020-10-19 ENCOUNTER — Encounter (HOSPITAL_COMMUNITY): Payer: Self-pay | Admitting: Psychiatry

## 2020-10-20 ENCOUNTER — Other Ambulatory Visit (HOSPITAL_COMMUNITY): Payer: Self-pay | Admitting: *Deleted

## 2020-10-20 DIAGNOSIS — F319 Bipolar disorder, unspecified: Secondary | ICD-10-CM

## 2020-10-20 DIAGNOSIS — R41840 Attention and concentration deficit: Secondary | ICD-10-CM

## 2020-10-20 MED ORDER — LAMOTRIGINE 150 MG PO TABS: 150.0000 mg | ORAL_TABLET | Freq: Every day | ORAL | 0 refills | Status: DC

## 2020-10-20 MED ORDER — QUETIAPINE FUMARATE 300 MG PO TABS: ORAL_TABLET | ORAL | 0 refills | Status: DC

## 2020-11-09 ENCOUNTER — Other Ambulatory Visit: Payer: Self-pay | Admitting: Internal Medicine

## 2020-11-09 ENCOUNTER — Encounter: Payer: Self-pay | Admitting: Internal Medicine

## 2020-11-09 DIAGNOSIS — G8929 Other chronic pain: Secondary | ICD-10-CM

## 2020-11-09 DIAGNOSIS — F5104 Psychophysiologic insomnia: Secondary | ICD-10-CM

## 2020-11-09 DIAGNOSIS — R41840 Attention and concentration deficit: Secondary | ICD-10-CM

## 2020-11-09 DIAGNOSIS — M5440 Lumbago with sciatica, unspecified side: Secondary | ICD-10-CM

## 2020-11-09 DIAGNOSIS — M5441 Lumbago with sciatica, right side: Secondary | ICD-10-CM

## 2020-11-09 DIAGNOSIS — J439 Emphysema, unspecified: Secondary | ICD-10-CM

## 2020-11-09 MED ORDER — MORPHINE SULFATE ER 30 MG PO T12A: 30.0000 mg | EXTENDED_RELEASE_TABLET | Freq: Three times a day (TID) | ORAL | 0 refills | Status: DC | PRN

## 2020-11-09 MED ORDER — PANTOPRAZOLE SODIUM 40 MG PO TBEC: 40.0000 mg | DELAYED_RELEASE_TABLET | Freq: Every day | ORAL | 3 refills | Status: DC

## 2020-11-09 MED ORDER — ANORO ELLIPTA 62.5-25 MCG/INH IN AEPB: 1.0000 | INHALATION_SPRAY | Freq: Every day | RESPIRATORY_TRACT | 3 refills | Status: DC | PRN

## 2020-11-09 MED ORDER — CLONIDINE HCL 0.1 MG PO TABS: 0.2000 mg | ORAL_TABLET | Freq: Every day | ORAL | 5 refills | Status: DC

## 2020-11-09 MED ORDER — ALBUTEROL SULFATE HFA 108 (90 BASE) MCG/ACT IN AERS
2.0000 | INHALATION_SPRAY | Freq: Four times a day (QID) | RESPIRATORY_TRACT | 3 refills | Status: DC | PRN
Start: 2020-11-09 — End: 2021-05-10

## 2020-11-09 NOTE — Progress Notes (Signed)
Medications reviewed and appropriate refills sent to pharmacy as requested.

## 2020-11-15 ENCOUNTER — Other Ambulatory Visit: Payer: Self-pay | Admitting: Internal Medicine

## 2020-11-15 DIAGNOSIS — M5440 Lumbago with sciatica, unspecified side: Secondary | ICD-10-CM

## 2020-11-15 DIAGNOSIS — G8929 Other chronic pain: Secondary | ICD-10-CM

## 2020-11-15 MED ORDER — MORPHINE SULFATE ER 30 MG PO T12A: 30.0000 mg | EXTENDED_RELEASE_TABLET | Freq: Three times a day (TID) | ORAL | 0 refills | Status: DC | PRN

## 2020-11-15 NOTE — Telephone Encounter (Signed)
Refill Request-  Pt wife state the patient's pharmacy is unable to fill the following medication:   Morphine Sulfate ER 30 MG [address] [facility] [id number] - [place], [place] - [address] (Ph: [phone])

## 2020-11-15 NOTE — Telephone Encounter (Signed)
Patient paged the on-call page regarding his MS Contin prescription.  He stated that there was a administrative error resulted in him not being able to fill his MS Contin.  He did call our office earlier this a.m. explained the situation.  One of the nurses on staff did speak with his pharmacy and noted that there was an error due to insurance codes.  An additional prescription was sent to the [facility] pharmacy on [address].  Patient was instructed to call if there were any other issues.

## 2020-11-15 NOTE — Telephone Encounter (Signed)
Spoke with pt's wife  States [facility]  Pharmacy was unable to refill rx sent in for Morphine ER 30mg  #90 by Dr on 11/12/20 Wife requesting [facility] resend a new rx CMA attempted to contact pharmacy, but they are closed for lunch until 1430 Once confirmed that rx was not filled, CMA will send refill request to attendind for review.[facility] Cassady9/19/20222:12 PM   Spoke with pharmacy-they were unable to refill rx due to some sort of "error/insurance codes" Ask that [facility] resend another rx The "main pharmacist" will be in tomorrow morning and she will attempt to process rx  Pt aware

## 2020-11-17 ENCOUNTER — Telehealth (INDEPENDENT_AMBULATORY_CARE_PROVIDER_SITE_OTHER): Payer: 59 | Admitting: Psychiatry

## 2020-11-17 ENCOUNTER — Encounter (HOSPITAL_COMMUNITY): Payer: Self-pay | Admitting: Psychiatry

## 2020-11-17 ENCOUNTER — Encounter: Payer: Self-pay | Admitting: Internal Medicine

## 2020-11-17 ENCOUNTER — Other Ambulatory Visit: Payer: Self-pay

## 2020-11-17 DIAGNOSIS — R41840 Attention and concentration deficit: Secondary | ICD-10-CM

## 2020-11-17 DIAGNOSIS — F319 Bipolar disorder, unspecified: Secondary | ICD-10-CM

## 2020-11-17 MED ORDER — QUETIAPINE FUMARATE 300 MG PO TABS: ORAL_TABLET | ORAL | 0 refills | Status: DC

## 2020-11-17 MED ORDER — LAMOTRIGINE 150 MG PO TABS: 150.0000 mg | ORAL_TABLET | Freq: Every day | ORAL | 0 refills | Status: DC

## 2020-11-17 NOTE — Progress Notes (Signed)
Virtual Visit via Video Note  I connected with Roberto Blair on 11/17/20 at  3:20 PM EDT by a video enabled telemedicine application and verified that I am speaking with the correct person using two identifiers.  Location: Patient: Home Provider: Home Office   I discussed the limitations of evaluation and management by telemedicine and the availability of in person appointments. The patient expressed understanding and agreed to proceed.  History of Present Illness: Patient is evaluated by video session.  He is taking his medication and he feels things are going very well.  Lately he is in a financial stress because his air conditioner not working and he will need a new system.  He reported work is also slow down but is hoping it will come back soon.  Patient is a Curator at a shop.  Denies any mania, anger, irritability.  He is taking Adderall that is helping his focus attention and multitasking.  Denies drinking or using any illegal substances.  He lives with his wife is very supportive.  He denies any impulsive behavior, paranoia, suicidal thoughts or homicidal thoughts.  He has no tremor or shakes or any EPS.     Past Psychiatric History: H/O anxiety, insomnia, mood swings and irritability.  Prescribed Seroquel and Klonopin by Dr. Elna Breslow. Adderall added by PCP Dr. Abigail Butts in [place].  Klonopin was switched to clonidine by PCP.  We tried Wellbutrin but that did not help him.  No h/o inpatient, suicidal attempt, psychosis.   Psychiatric Specialty Exam: Physical Exam  Review of Systems  Weight 228 lb (103.4 kg).There is no height or weight on file to calculate BMI.  General Appearance: Casual  Eye Contact:  Good  Speech:  Clear and Coherent  Volume:  Normal  Mood:  Anxious  Affect:  Appropriate  Thought Process:  Goal Directed  Orientation:  Full (Time, Place, and Person)  Thought Content:  WDL  Suicidal Thoughts:  No  Homicidal Thoughts:  No  Memory:  Immediate;    Good Recent;   Good Remote;   Good  Judgement:  Intact  Insight:  Present  Psychomotor Activity:  Normal  Concentration:  Concentration: Good and Attention Span: Good  Recall:  Good  Fund of Knowledge:  Good  Language:  Good  Akathisia:  No  Handed:  Right  AIMS (if indicated):     Assets:  Communication Skills Desire for Improvement Housing Talents/Skills Transportation  ADL's:  Intact  Cognition:  WNL  Sleep:         Assessment and Plan: Bipolar disorder type I.  ADHD.  Patient is stable on his Seroquel and Lamictal.  He has no rash or any itching.  He is getting Adderall from his PCP which is helping his ADHD symptoms.  Continue Seroquel 300 mg at bedtime and Lamictal 150 mg daily.  Recommended to call us back if is any question or any concern.  Follow-up in 3 months.  Follow Up Instructions:    I discussed the assessment and treatment plan with the patient. The patient was provided an opportunity to ask questions and all were answered. The patient agreed with the plan and demonstrated an understanding of the instructions.   The patient was advised to call back or seek an in-person evaluation if the symptoms worsen or if the condition fails to improve as anticipated.  I provided 19 minutes of non-face-to-face time during this encounter.   Cleotis Nipper, MD

## 2020-12-02 ENCOUNTER — Encounter: Payer: 59 | Admitting: Student

## 2020-12-09 ENCOUNTER — Encounter: Payer: Self-pay | Admitting: Internal Medicine

## 2020-12-09 DIAGNOSIS — R41840 Attention and concentration deficit: Secondary | ICD-10-CM

## 2020-12-10 ENCOUNTER — Encounter: Payer: Self-pay | Admitting: Internal Medicine

## 2020-12-12 ENCOUNTER — Other Ambulatory Visit: Payer: Self-pay | Admitting: Student

## 2020-12-12 DIAGNOSIS — G8929 Other chronic pain: Secondary | ICD-10-CM

## 2020-12-12 MED ORDER — MORPHINE SULFATE ER 30 MG PO T12A: 30.0000 mg | EXTENDED_RELEASE_TABLET | Freq: Three times a day (TID) | ORAL | 0 refills | Status: DC | PRN

## 2020-12-12 NOTE — Progress Notes (Signed)
Patient called after hours line requesting refill on morphine sulfate. Patient states last month there were difficulties with picking up this prescription and it was then delayed two days, last refill was on 11/15/20. He noted normal refill date is the 17th each month. He also stated that there were 2-3 days where he took extra pills because of increased pain.   Discussed with him will refill for pickup tomorrow 10/17 and I will reach out to his PCP and inform them of this. Patient also instructed to schedule appointment to follow up with PCP because of his early refill.

## 2020-12-29 ENCOUNTER — Encounter: Payer: 59 | Admitting: Internal Medicine

## 2021-01-05 ENCOUNTER — Other Ambulatory Visit: Payer: Self-pay

## 2021-01-05 ENCOUNTER — Encounter: Payer: Self-pay | Admitting: Internal Medicine

## 2021-01-05 ENCOUNTER — Ambulatory Visit (INDEPENDENT_AMBULATORY_CARE_PROVIDER_SITE_OTHER): Payer: 59 | Admitting: Internal Medicine

## 2021-01-05 DIAGNOSIS — M5441 Lumbago with sciatica, right side: Secondary | ICD-10-CM

## 2021-01-05 DIAGNOSIS — M5440 Lumbago with sciatica, unspecified side: Secondary | ICD-10-CM

## 2021-01-05 DIAGNOSIS — G8929 Other chronic pain: Secondary | ICD-10-CM

## 2021-01-05 DIAGNOSIS — H04203 Unspecified epiphora, bilateral lacrimal glands: Secondary | ICD-10-CM | POA: Diagnosis not present

## 2021-01-05 MED ORDER — GABAPENTIN 800 MG PO TABS: ORAL_TABLET | ORAL | 0 refills | Status: DC

## 2021-01-05 NOTE — Patient Instructions (Addendum)
It was nice seeing you today! Thank you for choosing [facility] for your Primary Care.    Today we talked about:   Neuropathy: We made some changes to your Gabapentin today.  In the morning take 1.5 tablets. In the afternoon, take 1 tablet. Before bedtime, take 1.5 tablets I sent in a one month script to the pharmacy. Please follow up via tele-health in 2 weeks.   Please follow up with Dr. Barbaraann Faster in December.

## 2021-01-06 DIAGNOSIS — H04209 Unspecified epiphora, unspecified lacrimal gland: Secondary | ICD-10-CM | POA: Insufficient documentation

## 2021-01-06 NOTE — Progress Notes (Signed)
   CC: Chronic back pain;eye watering   HPI:  Mr.Capri Judie Petit Saraceno is a 55 y.o. with a PMHx as listed below who presents to the clinic for chronic back pain; eye watering.   Please see the Encounters tab for problem-based Assessment & Plan regarding status of patient's acute and chronic conditions.  Past Medical History:  Diagnosis Date   Anxiety    Chronic back pain    GERD (gastroesophageal reflux disease)    Hypertension    prior, was on medication for a few months in the past   Lumbar disc herniation    OCD (obsessive compulsive disorder)    Sciatica    Review of Systems: Review of Systems  Constitutional:  Negative for chills, fever and weight loss.  Eyes:  Positive for discharge (water-y). Negative for blurred vision, double vision, photophobia, pain and redness.  Gastrointestinal:  Negative for abdominal pain, nausea and vomiting.  Musculoskeletal:  Positive for back pain.  Neurological:  Positive for tingling and sensory change.   Physical Exam:  Vitals:   01/05/21 1424  BP: (!) 155/76  Pulse: 73  Temp: 97.9 F (36.6 C)  TempSrc: Oral  SpO2: 98%  Weight: 235 lb 12.8 oz (107 kg)  Height: 6\' 1"  (1.854 m)   Physical Exam Vitals and nursing note reviewed.  Constitutional:      General: He is not in acute distress.    Appearance: He is normal weight.  Eyes:     General: Lids are normal. No allergic shiner, visual field deficit or scleral icterus.       Right eye: Discharge (water-y) present. No foreign body or hordeolum.        Left eye: Discharge (water-y) present.No foreign body or hordeolum.     Extraocular Movements: Extraocular movements intact.     Conjunctiva/sclera: Conjunctivae normal.     Right eye: Right conjunctiva is not injected. No chemosis, exudate or hemorrhage.    Left eye: Left conjunctiva is not injected. No chemosis, exudate or hemorrhage. Skin:    General: Skin is warm and dry.  Neurological:     Mental Status: He is alert and oriented to  person, place, and time.     Gait: Gait normal.  Psychiatric:        Mood and Affect: Mood normal.        Behavior: Behavior normal.        Thought Content: Thought content normal.        Judgment: Judgment normal.    Assessment & Plan:   See Encounters Tab for problem based charting.  Patient discussed with Dr. 

## 2021-01-06 NOTE — Assessment & Plan Note (Signed)
[name] presents today to discuss his chronic back pain medications.  He states that he has been having worsening neuropathy in his legs that radiates from his back.  Symptoms are worse in the morning and before bedtime.  He notes that this was previously well controlled with Roberto Blair but this medication was tapered.  He is requesting an increase in his gabapentin.  He denies any other complaints at this time.  Assessment/plan: - Continue morphine 30 mg 3 times daily, refill not required today - Increase gabapentin to 1.5 (1200 mg) tablets in the morning, and 1.5 (1200 mg) tablets in the evening.  Keep afternoon dose at 1 tablet (800 mg) only. - 2-week follow-up via telehealth to see if this is helping his neuropathy - Follow-up with Dr. Barbaraann Faster regarding any other changes in December

## 2021-01-06 NOTE — Assessment & Plan Note (Signed)
Roberto Blair states he has a long-term history of eye watering that was previously well controlled with punctal plugs placed by ophthalmology where he previously lived.  In the last few weeks.  He has been noticing increasing watering of his eyes.  He endorses a history of allergies but does not feel his symptoms are secondary to that.  He denies any photophobia, vision changes, purulent ocular discharge, nasal congestion, sinus pain.  Assessment/plan: Bilateral eye exam with presence of watery discharge, however no other abnormalities noted.  Per patient request, will place referral to ophthalmology.

## 2021-01-07 NOTE — Progress Notes (Signed)
Internal Medicine Clinic Attending  Case discussed with Dr. Basaraba  At the time of the visit.  We reviewed the resident's history and exam and pertinent patient test results.  I agree with the assessment, diagnosis, and plan of care documented in the resident's note.  

## 2021-01-12 ENCOUNTER — Other Ambulatory Visit: Payer: Self-pay

## 2021-01-12 ENCOUNTER — Other Ambulatory Visit: Payer: Self-pay | Admitting: Student

## 2021-01-12 DIAGNOSIS — M5440 Lumbago with sciatica, unspecified side: Secondary | ICD-10-CM

## 2021-01-12 DIAGNOSIS — M5441 Lumbago with sciatica, right side: Secondary | ICD-10-CM

## 2021-01-12 DIAGNOSIS — G8929 Other chronic pain: Secondary | ICD-10-CM

## 2021-01-12 DIAGNOSIS — R41840 Attention and concentration deficit: Secondary | ICD-10-CM

## 2021-01-12 MED ORDER — AMPHETAMINE-DEXTROAMPHETAMINE 20 MG PO TABS: 20.0000 mg | ORAL_TABLET | Freq: Every morning | ORAL | 0 refills | Status: DC

## 2021-01-12 MED ORDER — MORPHINE SULFATE ER 30 MG PO T12A: 30.0000 mg | EXTENDED_RELEASE_TABLET | Freq: Three times a day (TID) | ORAL | 0 refills | Status: DC | PRN

## 2021-01-12 NOTE — Telephone Encounter (Signed)
Morphine Sulfate ER 30 MG T12A,   amphetamine-dextroamphetamine (ADDERALL) 20 MG [facility]  [address]  gabapentin (NEURONTIN) 800 MG tablet, REFILL REQUEST @  [facility] [id number] - [place], [place] - [address]  Pt would like this meds to be filled by today.

## 2021-01-12 NOTE — Progress Notes (Signed)
Patient called and asked for refill of his morphine.  He saw Dr. Huel Cote last week.  PDMP review showed last refill of his morphine on 12/13/2020.  [facility] to [facility].

## 2021-01-12 NOTE — Telephone Encounter (Signed)
30 day supply of gabapentin sent 01/05/21

## 2021-01-19 ENCOUNTER — Ambulatory Visit (INDEPENDENT_AMBULATORY_CARE_PROVIDER_SITE_OTHER): Payer: 59 | Admitting: Internal Medicine

## 2021-01-19 ENCOUNTER — Other Ambulatory Visit: Payer: Self-pay

## 2021-01-19 DIAGNOSIS — G8929 Other chronic pain: Secondary | ICD-10-CM | POA: Diagnosis not present

## 2021-01-19 DIAGNOSIS — B002 Herpesviral gingivostomatitis and pharyngotonsillitis: Secondary | ICD-10-CM | POA: Diagnosis not present

## 2021-01-19 DIAGNOSIS — M5440 Lumbago with sciatica, unspecified side: Secondary | ICD-10-CM | POA: Diagnosis not present

## 2021-01-19 DIAGNOSIS — M5441 Lumbago with sciatica, right side: Secondary | ICD-10-CM

## 2021-01-19 MED ORDER — ACYCLOVIR 400 MG PO TABS: 400.0000 mg | ORAL_TABLET | Freq: Three times a day (TID) | ORAL | 0 refills | Status: AC

## 2021-01-19 NOTE — Progress Notes (Signed)
  [facility] Internal Medicine Residency Telephone Encounter Continuity Care Appointment  HPI:  This telephone encounter was created for [name] on 01/19/2021 for the following purpose/cc chronic back pain.   Past Medical History:  Past Medical History:  Diagnosis Date   Anxiety    Chronic back pain    GERD (gastroesophageal reflux disease)    Hypertension    prior, was on medication for a few months in the past   Lumbar disc herniation    OCD (obsessive compulsive disorder)    Sciatica      ROS:  + back pain, improved   Assessment / Plan / Recommendations:  Please see A&P under problem oriented charting for assessment of the patient's acute and chronic medical conditions.  As always, pt is advised that if symptoms worsen or new symptoms arise, they should go to an urgent care facility or to to ER for further evaluation.   Consent and Medical Decision Making:  Patient discussed with Dr. Oswaldo Done This is a telephone encounter between [facility] and Verdene Lennert on 01/19/2021 for chronic back pain. The visit was conducted with the patient located at home and Verdene Lennert at [facility]. The patient's identity was confirmed using their DOB and current address. The patient has consented to being evaluated through a telephone encounter and understands the associated risks (an examination cannot be done and the patient may need to come in for an appointment) / benefits (allows the patient to remain at home, decreasing exposure to coronavirus). I personally spent 8 minutes on medical discussion.

## 2021-01-19 NOTE — Assessment & Plan Note (Signed)
Roberto Blair states he has a long-term history of orolabial herpes simplex (since he was a child). Flares are generally triggered by stress. He takes acyclovir when he has a recurrent episode.  He most recently finished his last available prescription of his acyclovir and is asking for refill.  Assessment/plan: - Acyclovir 400 mg 3 times daily for 5 days as needed for herpes simplex flare.  Take within 3 days of symptom onset

## 2021-01-19 NOTE — Assessment & Plan Note (Signed)
Roberto Blair states his sciatica pain is significantly improved since the dose adjustment of Gabapentin. He is able to function better at work. No adverse effects noted.   Assessment/Plan: - Continue Gabapentin at current dose given improvement in symptoms (800 mg tablets, 1.5 tablets in the morning and afternoon, 1 tablet in the evening) - Continue Morphine (30 mg tablets, 1 tablet TID) - Follow up with PCP in December

## 2021-01-19 NOTE — Progress Notes (Signed)
Internal Medicine Clinic Attending  Case discussed with Dr. Basaraba  At the time of the visit.  We reviewed the resident's history and exam and pertinent patient test results.  I agree with the assessment, diagnosis, and plan of care documented in the resident's note.  

## 2021-01-28 ENCOUNTER — Telehealth (HOSPITAL_COMMUNITY): Payer: 59 | Admitting: Psychiatry

## 2021-02-07 ENCOUNTER — Encounter: Payer: Self-pay | Admitting: Internal Medicine

## 2021-02-07 DIAGNOSIS — M5440 Lumbago with sciatica, unspecified side: Secondary | ICD-10-CM

## 2021-02-07 DIAGNOSIS — G8929 Other chronic pain: Secondary | ICD-10-CM

## 2021-02-08 ENCOUNTER — Telehealth (HOSPITAL_COMMUNITY): Payer: 59 | Admitting: Psychiatry

## 2021-02-08 MED ORDER — MORPHINE SULFATE ER 30 MG PO T12A: 30.0000 mg | EXTENDED_RELEASE_TABLET | Freq: Three times a day (TID) | ORAL | 0 refills | Status: DC | PRN

## 2021-02-08 MED ORDER — GABAPENTIN 800 MG PO TABS: ORAL_TABLET | ORAL | 0 refills | Status: DC

## 2021-03-02 ENCOUNTER — Telehealth (HOSPITAL_BASED_OUTPATIENT_CLINIC_OR_DEPARTMENT_OTHER): Payer: 59 | Admitting: Psychiatry

## 2021-03-02 ENCOUNTER — Other Ambulatory Visit: Payer: Self-pay

## 2021-03-02 ENCOUNTER — Encounter (HOSPITAL_COMMUNITY): Payer: Self-pay | Admitting: Psychiatry

## 2021-03-02 DIAGNOSIS — M5440 Lumbago with sciatica, unspecified side: Secondary | ICD-10-CM

## 2021-03-02 DIAGNOSIS — G8929 Other chronic pain: Secondary | ICD-10-CM

## 2021-03-02 DIAGNOSIS — F319 Bipolar disorder, unspecified: Secondary | ICD-10-CM

## 2021-03-02 DIAGNOSIS — R41840 Attention and concentration deficit: Secondary | ICD-10-CM

## 2021-03-02 MED ORDER — LAMOTRIGINE 150 MG PO TABS: 150.0000 mg | ORAL_TABLET | Freq: Every day | ORAL | 0 refills | Status: DC

## 2021-03-02 MED ORDER — QUETIAPINE FUMARATE 300 MG PO TABS: ORAL_TABLET | ORAL | 0 refills | Status: DC

## 2021-03-02 NOTE — Progress Notes (Signed)
Virtual Visit via Telephone Note  I connected with Roberto Blair on 03/02/21 at  4:20 PM EST by telephone and verified that I am speaking with the correct person using two identifiers.  Location: Patient: In Car Provider: Home Office   I discussed the limitations, risks, security and privacy concerns of performing an evaluation and management service by telephone and the availability of in person appointments. I also discussed with the patient that there may be a patient responsible charge related to this service. The patient expressed understanding and agreed to proceed.   History of Present Illness: Patient is evaluated by phone session.  He is in the car.  He had a good Christmas and he is taking all his medication.  He has help at work and that helps him a lot.  His job is going very well.  He sleeps good.  He denies any mania, psychosis, hallucination.  He denies any crying spells or any feeling of hopelessness or worthlessness.  He wants to keep his current medication.  His appetite is okay.  Past Psychiatric History: H/O anxiety, insomnia, mood swings and irritability.  Prescribed Seroquel and Klonopin by Dr. [name]. Adderall added by PCP Dr. [name] in [place].  Klonopin was switched to clonidine by PCP.  We tried Wellbutrin but that did not help him.  No h/o inpatient, suicidal attempt, psychosis.    Psychiatric Specialty Exam: Physical Exam  Review of Systems  Weight 235 lb (106.6 kg).There is no height or weight on file to calculate BMI.  General Appearance: NA  Eye Contact:  NA  Speech:  Clear and Coherent and Normal Rate  Volume:  Normal  Mood:  Euthymic  Affect:  NA  Thought Process:  Goal Directed  Orientation:  Full (Time, Place, and Person)  Thought Content:  WDL  Suicidal Thoughts:  No  Homicidal Thoughts:  No  Memory:  Immediate;   Good Recent;   Good Remote;   Good  Judgement:  Good  Insight:  Present  Psychomotor Activity:  NA  Concentration:   Concentration: Good and Attention Span: Good  Recall:  Good  Fund of Knowledge:  Good  Language:  Good  Akathisia:  No  Handed:  Right  AIMS (if indicated):     Assets:  Communication Skills Desire for Improvement Housing Resilience Social Support Talents/Skills Transportation  ADL's:  Intact  Cognition:  WNL  Sleep:   ok      Assessment and Plan: Bipolar disorder type I.  ADHD.  Patient is a stable on his current medication.  He is getting Adderall from his PCP but is helping his attention and focus.  Continue Seroquel 300 mg at bedtime and Lamictal 150 mg daily.  Recommended to call us back if is any question of any concern.  Follow-up in 3 months.  Follow Up Instructions:    I discussed the assessment and treatment plan with the patient. The patient was provided an opportunity to ask questions and all were answered. The patient agreed with the plan and demonstrated an understanding of the instructions.   The patient was advised to call back or seek an in-person evaluation if the symptoms worsen or if the condition fails to improve as anticipated.  I provided 20 minutes of non-face-to-face time during this encounter.   Kathlee Nations, MD

## 2021-03-08 ENCOUNTER — Encounter: Payer: Self-pay | Admitting: Internal Medicine

## 2021-03-08 DIAGNOSIS — F319 Bipolar disorder, unspecified: Secondary | ICD-10-CM

## 2021-03-08 DIAGNOSIS — M5441 Lumbago with sciatica, right side: Secondary | ICD-10-CM

## 2021-03-08 DIAGNOSIS — G8929 Other chronic pain: Secondary | ICD-10-CM

## 2021-03-08 DIAGNOSIS — F5104 Psychophysiologic insomnia: Secondary | ICD-10-CM

## 2021-03-11 ENCOUNTER — Other Ambulatory Visit: Payer: Self-pay

## 2021-03-11 DIAGNOSIS — G8929 Other chronic pain: Secondary | ICD-10-CM

## 2021-03-11 MED ORDER — MORPHINE SULFATE ER 30 MG PO T12A: 30.0000 mg | EXTENDED_RELEASE_TABLET | Freq: Three times a day (TID) | ORAL | 0 refills | Status: DC | PRN

## 2021-03-11 MED ORDER — GABAPENTIN 800 MG PO TABS: ORAL_TABLET | ORAL | 0 refills | Status: DC

## 2021-03-11 NOTE — Telephone Encounter (Signed)
Please call pt back about medications refill.

## 2021-03-11 NOTE — Telephone Encounter (Signed)
Return call to pt - stated he did not call; must had been his wife. He stated as long as his meds are filled , he had no reason to call us. I asked if he received the message to call the pharmacy first for refill requests - stated they always tell him they have sent a request, waiting on the doctor to respond.  I talked to front office - Hme stated pt's wife had called and  she unsure which meds pt needs refilled so this  was why Hme asked triage to call the pt.  pt is waiting on Morphine and Gabapentin to be refilled.

## 2021-04-10 ENCOUNTER — Other Ambulatory Visit: Payer: Self-pay | Admitting: Student

## 2021-04-10 DIAGNOSIS — G8929 Other chronic pain: Secondary | ICD-10-CM

## 2021-04-10 DIAGNOSIS — M5440 Lumbago with sciatica, unspecified side: Secondary | ICD-10-CM

## 2021-04-10 MED ORDER — MORPHINE SULFATE ER 30 MG PO T12A: 30.0000 mg | EXTENDED_RELEASE_TABLET | Freq: Three times a day (TID) | ORAL | 0 refills | Status: DC | PRN

## 2021-04-10 NOTE — Progress Notes (Signed)
Patient called stating that [facility] was [facility] required a prior authorization and they were unwilling to let him pay through goodrx. I explained to the patient that I would send his script to [facility] and cancel his script at [facility]. His PDMP was appropriate. I will also forward this to his PCP and the yellow team to see if the prior auth has been completed. He stated that he was okay with this.

## 2021-04-11 ENCOUNTER — Telehealth: Payer: Self-pay | Admitting: Internal Medicine

## 2021-04-11 NOTE — Telephone Encounter (Signed)
Refill Request  Morphine Sulfate ER 30 MG T12A  Per pt a PA is needed   [facility]:[id number] - [place], [place] (Ph: [phone])

## 2021-05-10 ENCOUNTER — Other Ambulatory Visit: Payer: Self-pay

## 2021-05-10 DIAGNOSIS — G8929 Other chronic pain: Secondary | ICD-10-CM

## 2021-05-10 DIAGNOSIS — F319 Bipolar disorder, unspecified: Secondary | ICD-10-CM

## 2021-05-10 DIAGNOSIS — R41840 Attention and concentration deficit: Secondary | ICD-10-CM

## 2021-05-10 DIAGNOSIS — F5104 Psychophysiologic insomnia: Secondary | ICD-10-CM

## 2021-05-10 DIAGNOSIS — J439 Emphysema, unspecified: Secondary | ICD-10-CM

## 2021-05-11 ENCOUNTER — Telehealth: Payer: Self-pay

## 2021-05-11 MED ORDER — LAMOTRIGINE 150 MG PO TABS: 150.0000 mg | ORAL_TABLET | Freq: Every day | ORAL | 1 refills | Status: DC

## 2021-05-11 MED ORDER — ALBUTEROL SULFATE HFA 108 (90 BASE) MCG/ACT IN AERS: 2.0000 | INHALATION_SPRAY | Freq: Four times a day (QID) | RESPIRATORY_TRACT | 3 refills | Status: DC | PRN

## 2021-05-11 MED ORDER — PANTOPRAZOLE SODIUM 40 MG PO TBEC: 40.0000 mg | DELAYED_RELEASE_TABLET | Freq: Every day | ORAL | 3 refills | Status: AC

## 2021-05-11 MED ORDER — AMPHETAMINE-DEXTROAMPHETAMINE 20 MG PO TABS: 20.0000 mg | ORAL_TABLET | Freq: Every morning | ORAL | 0 refills | Status: DC

## 2021-05-11 MED ORDER — GABAPENTIN 800 MG PO TABS: ORAL_TABLET | ORAL | 0 refills | Status: DC

## 2021-05-11 MED ORDER — QUETIAPINE FUMARATE 300 MG PO TABS: ORAL_TABLET | ORAL | 1 refills | Status: DC

## 2021-05-11 MED ORDER — MORPHINE SULFATE ER 30 MG PO T12A: 30.0000 mg | EXTENDED_RELEASE_TABLET | Freq: Three times a day (TID) | ORAL | 0 refills | Status: DC | PRN

## 2021-05-11 MED ORDER — CLONIDINE HCL 0.2 MG PO TABS: 0.2000 mg | ORAL_TABLET | Freq: Every day | ORAL | 1 refills | Status: DC

## 2021-05-11 NOTE — Telephone Encounter (Signed)
Pa for pt ( MORPHINE SULFATE ER 30 MG TAB)  came through on cover my meds was submitted with office notes .. awaiting approval or denial .. ? ? ? ? UPDATE : ? ? ?[facility] is reviewing your PA request and will respond within 24 hours for Medicaid or up to 72 hours for non-Medicaid plans, based on the required timeframe determined by state or federal regulations. ?

## 2021-05-20 ENCOUNTER — Other Ambulatory Visit: Payer: Self-pay | Admitting: Internal Medicine

## 2021-05-31 ENCOUNTER — Telehealth (HOSPITAL_COMMUNITY): Payer: 59 | Admitting: Psychiatry

## 2021-06-01 ENCOUNTER — Telehealth (HOSPITAL_COMMUNITY): Payer: 59 | Admitting: Psychiatry

## 2021-06-09 ENCOUNTER — Telehealth (HOSPITAL_COMMUNITY): Payer: 59 | Admitting: Psychiatry

## 2021-06-09 ENCOUNTER — Other Ambulatory Visit: Payer: Self-pay

## 2021-06-09 DIAGNOSIS — G8929 Other chronic pain: Secondary | ICD-10-CM

## 2021-06-09 MED ORDER — MORPHINE SULFATE ER 30 MG PO T12A: 30.0000 mg | EXTENDED_RELEASE_TABLET | Freq: Three times a day (TID) | ORAL | 0 refills | Status: DC | PRN

## 2021-06-09 MED ORDER — GABAPENTIN 800 MG PO TABS: ORAL_TABLET | ORAL | 0 refills | Status: DC

## 2021-06-09 NOTE — Telephone Encounter (Signed)
Patient paged discussed that he needed refills on his morphine sulfate and gabapentin.  Appears Dr. Barbaraann Faster placed refills on 05/11/2021.  Is due for refill of morphine on 4/14.  [facility]. ?

## 2021-06-16 ENCOUNTER — Encounter (HOSPITAL_COMMUNITY): Payer: Self-pay | Admitting: Psychiatry

## 2021-06-16 ENCOUNTER — Telehealth (HOSPITAL_BASED_OUTPATIENT_CLINIC_OR_DEPARTMENT_OTHER): Payer: 59 | Admitting: Psychiatry

## 2021-06-16 DIAGNOSIS — F319 Bipolar disorder, unspecified: Secondary | ICD-10-CM

## 2021-06-16 DIAGNOSIS — R41840 Attention and concentration deficit: Secondary | ICD-10-CM

## 2021-06-16 MED ORDER — QUETIAPINE FUMARATE 300 MG PO TABS: ORAL_TABLET | ORAL | 0 refills | Status: DC

## 2021-06-16 MED ORDER — LAMOTRIGINE 150 MG PO TABS: 150.0000 mg | ORAL_TABLET | Freq: Every day | ORAL | 0 refills | Status: DC

## 2021-06-16 NOTE — Progress Notes (Signed)
Virtual Visit via Video Note ? ?I connected with Roberto Blair on 06/16/21 at  8:20 AM EDT by a video enabled telemedicine application and verified that I am speaking with the correct person using two identifiers. ? ?Location: ?Patient: Home ?Provider: Home Office ?  ?I discussed the limitations of evaluation and management by telemedicine and the availability of in person appointments. The patient expressed understanding and agreed to proceed. ? ?History of Present Illness: ?Patient is evaluated by phone session.  He is doing much better since he switched his job 2 months ago.  He is working in a different job but is still working as a even though he is more busy about he like it.  He is sleeping good.  He denies any irritability, anger and sedation.  He.  His weight is stable.  He admitted that he needed to see his primary care physician Dr. Madalyn Rob for physical and blood work.  He is going to schedule appointments.  He is getting Adderall from his office.  He has no tremors, shakes or any EPS.  He would like to keep his current medication.  He has no rash or itching.   ? ?Past Psychiatric History: ?H/O anxiety, insomnia, mood swings and irritability.  Prescribed Seroquel and Klonopin by Dr. [name]. Adderall added by PCP Dr. [name] in [place].  Klonopin was switched to clonidine by PCP.  We tried Wellbutrin but that did not help him.  No h/o inpatient, suicidal attempt, psychosis.  ? ?Psychiatric Specialty Exam: ?Physical Exam  ?Review of Systems  ?Weight 235 lb (106.6 kg).There is no height or weight on file to calculate BMI.  ?General Appearance: NA  ?Eye Contact:  NA  ?Speech:  Normal Rate  ?Volume:  Normal  ?Mood:  Euthymic  ?Affect:  NA  ?Thought Process:  Coherent  ?Orientation:  Full (Time, Place, and Person)  ?Thought Content:  Logical  ?Suicidal Thoughts:  No  ?Homicidal Thoughts:  No  ?Memory:  Immediate;   Good ?Recent;   Good ?Remote;   Good  ?Judgement:  Intact  ?Insight:  Good   ?Psychomotor Activity:  NA  ?Concentration:  Concentration: Fair and Attention Span: Fair  ?Recall:  Good  ?Fund of Knowledge:  Good  ?Language:  Good  ?Akathisia:  No  ?Handed:  Right  ?AIMS (if indicated):     ?Assets:  Communication Skills ?Desire for Improvement ?Housing ?Resilience ?Social Support ?Talents/Skills ?Transportation  ?ADL's:  Intact  ?Cognition:  WNL  ?Sleep:   ok  ?  ? ? ?Assessment and Plan: ?Bipolar disorder type I.  ADHD, combined type. ? ?Patient is stable on his current medication.  Continue Lamictal on milligrams daily and Seroquel 300 mg daily.  He is also taking morphine sulfate, gabapentin, clonidine 0.2 mg and Adderall 20 mg from other providers.  Discussed poly pharmacy.  Encourage to keep appointment primary care physician for physical and blood work.  Call us if he has any questions or any concern.  Patient has switched his pharmacy and now sent to [facility].  Follow-up in 3 months.   ? ?Follow Up Instructions: ? ?  ?I discussed the assessment and treatment plan with the patient. The patient was provided an opportunity to ask questions and all were answered. The patient agreed with the plan and demonstrated an understanding of the instructions. ?  ?The patient was advised to call back or seek an in-person evaluation if the symptoms worsen or if the condition fails to improve as anticipated. ? ?  Collaboration of Care: Primary Care Provider AEB notes are in epic to review. ? ?Patient/Guardian was advised Release of Information must be obtained prior to any record release in order to collaborate their care with an outside provider. Patient/Guardian was advised if they have not already done so to contact the registration department to sign all necessary forms in order for [place] to release information regarding their care.  ? ?Consent: Patient/Guardian gives verbal consent for treatment and assignment of benefits for services provided during this visit. Patient/Guardian expressed  understanding and agreed to proceed.   ? ?I provided 19 minutes of non-face-to-face time during this encounter. ? ? ?Kathlee Nations, MD  ?

## 2021-06-23 ENCOUNTER — Telehealth: Payer: Self-pay

## 2021-06-23 NOTE — Telephone Encounter (Signed)
Patient notified he has refills on all these meds at [facility]. [facility] All pharmacies except [facility] removed from med list. Patient is very Adult nurse. ?

## 2021-06-23 NOTE — Telephone Encounter (Signed)
gabapentin (NEURONTIN) 800 MG tablet ? ?Morphine Sulfate ER 30 MG T12A  ? ?cloNIDine (CATAPRES) 0.2 MG tablet ? ?amphetamine-dextroamphetamine (ADDERALL) 20 MG tablet  ? ?[id number] - [place], [place] [place] - [address] AT [facility] OF [address]  ?[address], [place]  [address]  ?Phone:  [phone]  Fax:  [phone]  ?

## 2021-07-08 ENCOUNTER — Other Ambulatory Visit: Payer: Self-pay

## 2021-07-08 DIAGNOSIS — G8929 Other chronic pain: Secondary | ICD-10-CM

## 2021-07-08 DIAGNOSIS — R41840 Attention and concentration deficit: Secondary | ICD-10-CM

## 2021-07-08 NOTE — Telephone Encounter (Signed)
Morphine ?Last rx written 06/09/21. ?Last OV 01/12/21 w/Dr Huel Cote. ?Next OV - has not been scheduled. ?UDS 07/21/20. ? ?

## 2021-07-08 NOTE — Telephone Encounter (Signed)
albuterol (VENTOLIN HFA) 108 (90 Base) MCG/ACT inhaler ? ?amphetamine-dextroamphetamine (ADDERALL) 20 MG tablet ? ?cloNIDine (CATAPRES) 0.2 MG tablet ? ?gabapentin (NEURONTIN) 800 MG tablet ? ?lamoTRIgine (LAMICTAL) 150 MG tablet ? ?Morphine Sulfate ER 30 MG T12A ? ?pantoprazole (PROTONIX) 40 MG tablet ? ?QUEtiapine (SEROQUEL) 300 MG tablet ? ?umeclidinium-vilanterol (ANORO ELLIPTA) 62.5-25 MCG/, refill request @ [id number] - [place], [place] - [address] AT [facility] OF [address]. ?

## 2021-07-09 ENCOUNTER — Other Ambulatory Visit: Payer: Self-pay | Admitting: Internal Medicine

## 2021-07-09 ENCOUNTER — Telehealth: Payer: Self-pay | Admitting: Student

## 2021-07-09 DIAGNOSIS — G8929 Other chronic pain: Secondary | ICD-10-CM

## 2021-07-09 MED ORDER — MORPHINE SULFATE ER 30 MG PO T12A: 30.0000 mg | EXTENDED_RELEASE_TABLET | Freq: Three times a day (TID) | ORAL | 0 refills | Status: DC | PRN

## 2021-07-09 MED ORDER — GABAPENTIN 800 MG PO TABS: ORAL_TABLET | ORAL | 0 refills | Status: DC

## 2021-07-09 NOTE — Progress Notes (Addendum)
Received after hours page from this pt in regards to medication refills. [facility] but that [facility] would prefer this medication sent to [facility]. Also requesting refill of morphine and adderall.  ? ?PDMP reviewed: explained to patient that a 90 day supply of adderall was sent to [facility] in March. Pt said he will look into this. Sent rx for morphine (will be filled 30 days after last refill). [facility]. ?

## 2021-07-11 ENCOUNTER — Other Ambulatory Visit: Payer: Self-pay | Admitting: Internal Medicine

## 2021-07-11 MED ORDER — ANORO ELLIPTA 62.5-25 MCG/ACT IN AEPB: 1.0000 | INHALATION_SPRAY | Freq: Every day | RESPIRATORY_TRACT | 2 refills | Status: DC

## 2021-07-11 NOTE — Telephone Encounter (Signed)
Refused requested refill of Adderall. Requesting one month early. Refilled in March for 3 month supply.  ?

## 2021-07-11 NOTE — Progress Notes (Signed)
Refill sent for Anoro-Ellipta ?

## 2021-07-12 ENCOUNTER — Telehealth: Payer: Self-pay | Admitting: Internal Medicine

## 2021-07-12 DIAGNOSIS — G8929 Other chronic pain: Secondary | ICD-10-CM

## 2021-07-12 NOTE — Telephone Encounter (Signed)
Received call from patient overnight regarding his morphine prescription. Patient had requested a refill on 07/09/2021 and was sent for 30 day supply of #90 tablets to [facility]. However, he only received 50 tablets due to supply shortage. He is requesting the remainder of his prescription at this time. PDMP reviewed and called [facility] on [address] and confirmed with pharmacy tech. At the time of pick up, they only had #50 tablets which the patient picked up and will need new prescription for remaining #40 tablets. His current prescription should last through 07/25/2021. Will send Rx to be filled on 07/24/2021 for #40 tablets to [facility] pharmacy on [facility].  ? ?Eliezer Bottom, MD ?Internal Medicine, PGY-3 ?07/12/21 8:45 PM ?Pager # [phone] ? ? ?

## 2021-07-13 ENCOUNTER — Telehealth: Payer: Self-pay | Admitting: *Deleted

## 2021-07-13 MED ORDER — MORPHINE SULFATE ER 30 MG PO T12A: 30.0000 mg | EXTENDED_RELEASE_TABLET | Freq: Three times a day (TID) | ORAL | 0 refills | Status: DC | PRN

## 2021-07-13 NOTE — Telephone Encounter (Signed)
Call from patient's wife-wanted to know when patient can pick up the remainder of his Morphine prescription. Pharmacy only had 50 tablets at the time of pick up.  Additional prescription has been written with a pick up date of 07/24/2021. Patient per his wife is OCD and is having a problem with not having all of the medication at 1 time and has a problem waiting until 07/24/2021 to pick up the remainder of his medication.   Wife was informed that a message would be sent to the PCP.  ?

## 2021-08-08 ENCOUNTER — Other Ambulatory Visit: Payer: Self-pay | Admitting: Student

## 2021-08-08 ENCOUNTER — Telehealth: Payer: Self-pay | Admitting: Student

## 2021-08-08 DIAGNOSIS — G8929 Other chronic pain: Secondary | ICD-10-CM

## 2021-08-08 DIAGNOSIS — F5104 Psychophysiologic insomnia: Secondary | ICD-10-CM

## 2021-08-08 MED ORDER — CLONIDINE HCL 0.2 MG PO TABS: 0.2000 mg | ORAL_TABLET | Freq: Every day | ORAL | 1 refills | Status: DC

## 2021-08-08 MED ORDER — MORPHINE SULFATE ER 30 MG PO T12A: 30.0000 mg | EXTENDED_RELEASE_TABLET | Freq: Three times a day (TID) | ORAL | 0 refills | Status: DC | PRN

## 2021-08-08 NOTE — Telephone Encounter (Signed)
Patient called after hours [facility] resident to discuss medication refills. He states tomorrow he will be out of his morphine and that he normally receives 60 pills not 90. Per PDMP he normally receives 90 pills, last month his prescription was split into 50 pills and 40 pills as pharmacy was out of prescriptions. He is due for refill tomorrow, will send in to pharmacy.

## 2021-09-05 ENCOUNTER — Other Ambulatory Visit: Payer: Self-pay

## 2021-09-05 DIAGNOSIS — G8929 Other chronic pain: Secondary | ICD-10-CM

## 2021-09-05 MED ORDER — MORPHINE SULFATE ER 30 MG PO T12A: 30.0000 mg | EXTENDED_RELEASE_TABLET | Freq: Three times a day (TID) | ORAL | 0 refills | Status: DC | PRN

## 2021-09-05 NOTE — Telephone Encounter (Signed)
Morphine Sulfate ER 30 MG T12A, refill request @ [id number] - [place], [place] - [address] AT [facility] OF [address].

## 2021-09-15 ENCOUNTER — Telehealth (HOSPITAL_COMMUNITY): Payer: Self-pay | Admitting: Psychiatry

## 2021-10-06 ENCOUNTER — Ambulatory Visit: Payer: Self-pay | Admitting: Student

## 2021-10-06 ENCOUNTER — Encounter: Payer: Self-pay | Admitting: Student

## 2021-10-06 DIAGNOSIS — R41840 Attention and concentration deficit: Secondary | ICD-10-CM

## 2021-10-06 DIAGNOSIS — G8929 Other chronic pain: Secondary | ICD-10-CM

## 2021-10-06 DIAGNOSIS — F5104 Psychophysiologic insomnia: Secondary | ICD-10-CM

## 2021-10-06 DIAGNOSIS — M5440 Lumbago with sciatica, unspecified side: Secondary | ICD-10-CM

## 2021-10-06 MED ORDER — MORPHINE SULFATE 30 MG PO TABS: 30.0000 mg | ORAL_TABLET | ORAL | 0 refills | Status: DC | PRN

## 2021-10-06 MED ORDER — MORPHINE SULFATE ER 30 MG PO T12A: 30.0000 mg | EXTENDED_RELEASE_TABLET | Freq: Three times a day (TID) | ORAL | 0 refills | Status: DC | PRN

## 2021-10-06 MED ORDER — GABAPENTIN 800 MG PO TABS: ORAL_TABLET | ORAL | 12 refills | Status: DC

## 2021-10-06 MED ORDER — AMPHETAMINE-DEXTROAMPHETAMINE 20 MG PO TABS: 20.0000 mg | ORAL_TABLET | Freq: Every morning | ORAL | 0 refills | Status: DC

## 2021-10-06 NOTE — Assessment & Plan Note (Signed)
Patient uses Adderall to help him concentrate during work.  -Refill Adderall for 3 months

## 2021-10-06 NOTE — Assessment & Plan Note (Addendum)
Patient reports a stable level of pain.  States that the summers are usually a bit better for him compared to the winter while rainy days exacerbate his pain, so today he is feeling a little worse than usual for a summer day.  He reports about 75 to 80% pain relief with the morphine sulfate. He uses the morphine sulfate 3 times per day and is compliant with pain contract.  No evidence or history of abuse.  -Urine tox screen -Refill morphine sulfate for 3 months and gabapentin for 1 year

## 2021-10-06 NOTE — Progress Notes (Signed)
   CC: Follow-up for Back Pain  HPI:   [name] is a 55 y.o. male with a past medical history of chronic low back pain plus sciatica, tobacco use, chronic opioid use, and emphysema presents for routine follow-up of back pain and pain medication refill.    Past Medical History:  Diagnosis Date   Anxiety    Chronic back pain    GERD (gastroesophageal reflux disease)    Hypertension    prior, was on medication for a few months in the past   Lumbar disc herniation    OCD (obsessive compulsive disorder)    Sciatica      Review of Systems:    Reports low back and thigh pain Denies fever, chills, recent illnesses, bowel changes   Physical Exam:  Vitals:   10/06/21 0943  BP: 131/73  Pulse: 78  Temp: 98 F (36.7 C)  TempSrc: Oral  SpO2: 99%  Weight: 232 lb 1.9 oz (105.3 kg)    General:   awake and alert, sitting comfortably in chair, cooperative, not in acute distress Skin:   warm and dry, intact without any obvious lesions or scars, no rashes or lesions  Lungs:   normal respiratory effort, breathing unlabored, symmetrical chest rise, no crackles or wheezing Cardiac:   regular rate and rhythm, normal S1 and S2 Psychiatric:   mood and affect normal, intelligible speech    Assessment & Plan:   Chronic bilateral low back pain with sciatica Patient reports a stable level of pain.  States that the summers are usually a bit better for him compared to the winter while rainy days exacerbate his pain, so today he is feeling a little worse than usual for a summer day.  He reports about 75 to 80% pain relief with the morphine sulfate. He uses the morphine sulfate 3 times per day and is compliant with pain contract.  No evidence or history of abuse.  -Urine tox screen -Refill morphine sulfate for 3 months and gabapentin for 1 year   Attention and concentration deficit Patient uses Adderall to help him concentrate during work.  -Refill Adderall for 3  months     See Encounters Tab for problem based charting.  Patient seen with Dr. Antony Contras

## 2021-10-06 NOTE — Patient Instructions (Signed)
  Thank you, Mr.Yavuz M Tabor, for allowing [place] to provide your care today. Today we discussed . . .  > Back-Leg Pain       - It is good to know that you are experiencing significant relief with these medications       - We have ordered refills for your morphine sulfate (34mo), gabapentin (46mo), and Adderall (68mo)    I have ordered the following labs for you:  Lab Orders  No laboratory test(s) ordered today      Tests ordered today:  Urine Toxicity Screen    Referrals ordered today:   Referral Orders  No referral(s) requested today      I have ordered the following medication/changed the following medications:   Stop the following medications: There are no discontinued medications.   Start the following medications: No orders of the defined types were placed in this encounter.     Follow up: 3 months    Remember:  Please return to clinic in about 3 months!   Should you have any questions or concerns please call the internal medicine clinic at [phone].     Lajuana Ripple, MD [facility]

## 2021-10-10 NOTE — Progress Notes (Signed)
Internal Medicine Clinic Attending  I saw and evaluated the patient.  I personally confirmed the key portions of the history and exam documented by Dr. Clearance Coots and I reviewed pertinent patient test results.  The assessment, diagnosis, and plan were formulated together and I agree with the documentation in the resident's note.   Patient is on chronic morphine for chronic pain as well as adderall for ADHD. We have recently taken over prescribing both of these medicines after he established care with [place]. Although we do not normally treat ADHD, he has been on a stable dose of both of these medications for awhile. Doing well, prior utoxs have been appropriate. Continue current regimen for now.

## 2021-10-12 LAB — TOXASSURE SELECT,+ANTIDEPR,UR

## 2021-10-18 ENCOUNTER — Telehealth: Payer: Self-pay | Admitting: *Deleted

## 2021-10-18 NOTE — Telephone Encounter (Signed)
Call from patient's wife stated patient tested positive for Covid on Today.  Symptoms started on Sunday./  Low grade fever of 99.5.  Congestion and coughing non productive.  Headaches.  Tired and achey.  Can be reached at [phone].

## 2021-10-19 NOTE — Telephone Encounter (Signed)
No one picked up when I called. I asked them to call back tomorrow. Front desk, I am on panel management tomorrow afternoon but you can set up a virtual visit with me around 1 pm.

## 2021-10-19 NOTE — Telephone Encounter (Signed)
Call from pt's wife once again. She stated pt's job is requiring a negative covid test and a letter from his doctor stating he's negative before he can return to work. She wants to know if he has to come into the office to be tested or  can he do a at-home test? Also she stated pt needs something for cough and nausea. No available appts until next week.  Requesting a return call - [phone]. Thanks

## 2021-10-26 NOTE — Telephone Encounter (Signed)
Entered in error

## 2021-10-27 ENCOUNTER — Telehealth: Payer: Self-pay

## 2021-10-27 NOTE — Telephone Encounter (Signed)
Requesting a letter for work, please call pt back.  

## 2021-10-27 NOTE — Telephone Encounter (Signed)
Returned call to patient's wife. No answer and no VM set up.

## 2021-10-28 NOTE — Telephone Encounter (Signed)
Patient called back. States he first tested positive for Covid on 8/22. He has tested negative the last 3 days. He is requesting a letter stating he has tested negative and is able to return to work without restrictions. He would like this done today.He can receive the letter through MyChart.

## 2021-10-28 NOTE — Telephone Encounter (Signed)
Patient calling again for letter. States he needs it to return to work this weekend.

## 2021-11-05 ENCOUNTER — Other Ambulatory Visit: Payer: Self-pay | Admitting: Student

## 2021-11-05 ENCOUNTER — Telehealth: Payer: Self-pay | Admitting: Student

## 2021-11-05 DIAGNOSIS — G8929 Other chronic pain: Secondary | ICD-10-CM

## 2021-11-05 MED ORDER — MORPHINE SULFATE ER 30 MG PO T12A: 30.0000 mg | EXTENDED_RELEASE_TABLET | Freq: Three times a day (TID) | ORAL | 0 refills | Status: DC | PRN

## 2021-11-05 NOTE — Telephone Encounter (Signed)
Patient called about his Morphine ER. States IR morphine was sent to pharmacy instead of his usual prescription for chronic pain. PDMP reviewed and appropriate. Does appear Dr. Clearance Coots prescribed IR morphine. [facility]. Rx for Morphine Sulfate ER 30 mg three times daily PRN #90 tablets sent to [facility] on [address].

## 2021-11-08 ENCOUNTER — Other Ambulatory Visit: Payer: Self-pay | Admitting: Student

## 2021-11-08 DIAGNOSIS — G8929 Other chronic pain: Secondary | ICD-10-CM

## 2021-11-08 NOTE — Telephone Encounter (Signed)
Pt called and stated to send refill on Gabapentin to [facility] on [facility].

## 2021-11-08 NOTE — Telephone Encounter (Signed)
Call to patient message left. Gabapentin refill was sent to a different Pharmacy.  Where would patient like for refills to be sent.

## 2021-11-30 ENCOUNTER — Other Ambulatory Visit: Payer: Self-pay

## 2021-11-30 DIAGNOSIS — F319 Bipolar disorder, unspecified: Secondary | ICD-10-CM

## 2021-11-30 DIAGNOSIS — F5104 Psychophysiologic insomnia: Secondary | ICD-10-CM

## 2021-11-30 DIAGNOSIS — R41840 Attention and concentration deficit: Secondary | ICD-10-CM

## 2021-11-30 MED ORDER — CLONIDINE HCL 0.2 MG PO TABS: 0.2000 mg | ORAL_TABLET | Freq: Every day | ORAL | 1 refills | Status: DC

## 2021-11-30 NOTE — Telephone Encounter (Signed)
cloNIDine (CATAPRES) 0.2 MG tablet, refill request @ [id number] - [place], [address] AT [place].  QUEtiapine (SEROQUEL) 300 MG tablet, refill request @ [facility] [address] [place], [place]. AT [place]

## 2021-12-06 ENCOUNTER — Other Ambulatory Visit: Payer: Self-pay | Admitting: Student

## 2021-12-06 DIAGNOSIS — F5104 Psychophysiologic insomnia: Secondary | ICD-10-CM

## 2022-01-05 ENCOUNTER — Encounter: Payer: Self-pay | Admitting: Student

## 2022-01-05 ENCOUNTER — Ambulatory Visit: Payer: Self-pay | Admitting: Student

## 2022-01-05 VITALS — BP 132/85 | HR 69 | Wt 221.3 lb

## 2022-01-05 DIAGNOSIS — G8929 Other chronic pain: Secondary | ICD-10-CM

## 2022-01-05 DIAGNOSIS — M5440 Lumbago with sciatica, unspecified side: Secondary | ICD-10-CM

## 2022-01-05 DIAGNOSIS — R41840 Attention and concentration deficit: Secondary | ICD-10-CM

## 2022-01-05 DIAGNOSIS — M25561 Pain in right knee: Secondary | ICD-10-CM | POA: Insufficient documentation

## 2022-01-05 DIAGNOSIS — F5104 Psychophysiologic insomnia: Secondary | ICD-10-CM

## 2022-01-05 DIAGNOSIS — F319 Bipolar disorder, unspecified: Secondary | ICD-10-CM

## 2022-01-05 MED ORDER — MORPHINE SULFATE ER 30 MG PO T12A: 30.0000 mg | EXTENDED_RELEASE_TABLET | Freq: Three times a day (TID) | ORAL | 0 refills | Status: DC | PRN

## 2022-01-05 MED ORDER — QUETIAPINE FUMARATE 300 MG PO TABS: ORAL_TABLET | ORAL | 0 refills | Status: DC

## 2022-01-05 MED ORDER — LIDOCAINE HCL (PF) 1 % IJ SOLN: 2.0000 mL | Freq: Once | INTRAMUSCULAR | Status: DC

## 2022-01-05 MED ORDER — TRIAMCINOLONE ACETONIDE 40 MG/ML IJ SUSP: 40.0000 mg | Freq: Once | INTRAMUSCULAR | Status: DC

## 2022-01-05 MED ORDER — MORPHINE SULFATE ER 30 MG PO T12A: 30.0000 mg | EXTENDED_RELEASE_TABLET | Freq: Three times a day (TID) | ORAL | 0 refills | Status: AC | PRN

## 2022-01-05 MED ORDER — CLONIDINE HCL 0.2 MG PO TABS: 0.2000 mg | ORAL_TABLET | Freq: Every day | ORAL | 1 refills | Status: DC

## 2022-01-05 MED ORDER — GABAPENTIN 800 MG PO TABS: ORAL_TABLET | ORAL | 1 refills | Status: DC

## 2022-01-05 NOTE — Assessment & Plan Note (Addendum)
Patient requests refill for Adderall 20 mg today. Per PDMP, last fill for Adderall was in March. Last Rx on 8/10 was printed for 3 month supply and [facility] [address] but did not return to pick medication up. [facility], prescription is still there, will fill it for pickup. Advised [facility] to get his Adderall. Refill will be appropriate in 3 months. 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   Plan -[facility] that is [facility] for pickup (90 tablets)

## 2022-01-05 NOTE — Assessment & Plan Note (Signed)
Patient presents today for medication refill. Reports back pain controlled with morphine sulfate 30 mg TID as needed. Also on gabapentin 800 mg 1.5 tab in morning, 1 tab in afternoon, 1.5 tab in evening. Last ToxAssure was appropriate. PDMP review today showed consistent refills. However, patient reports that sometimes he uses different pharmacies to reduce cost given uninsured. He plans to have insurance starting next year. Advised him regarding pain contract he signed about choosing a single pharmacy for the controlled medications. He understands.   Plan -refilled morphine sulfate for 3 months (3 x Rx of 90 tablets, [facility] refills [facility] [address])  -refilled gabapentin (last Rx on 9/12 for 120 tablets, no refills listed)

## 2022-01-05 NOTE — Assessment & Plan Note (Signed)
Patient with hx of right knee pain presents with persistent pain for past several weeks. He feels that at times the knee is popping. Denies any recent injuries or trauma. History of injuries to knee before. Exam today showed some swelling with crepitus noted. Good ROM with no laxity noted. No erythema or obvious deformity. He states he has had steroid injections before with relief. Discussed doing a right knee injection and the risks and benefits. Patient agrees to have injection completed today. Tolerated injection well with no other concerns.   Plan -right knee injection today  -on chronic opioid for back pain

## 2022-01-05 NOTE — Progress Notes (Signed)
CC: Medication refill  HPI:  Roberto Blair is a 56 y.o. male living with a history stated below and presents today for medication refill. Please see problem based assessment and plan for additional details.  Past Medical History:  Diagnosis Date   Anxiety    Chronic back pain    GERD (gastroesophageal reflux disease)    Hypertension    prior, was on medication for a few months in the past   Lumbar disc herniation    OCD (obsessive compulsive disorder)    Sciatica     Current Outpatient Medications on File Prior to Visit  Medication Sig Dispense Refill   albuterol (VENTOLIN HFA) 108 (90 Base) MCG/ACT inhaler Inhale 2 puffs into the lungs every 6 (six) hours as needed for wheezing or shortness of breath. 18 g 3   amphetamine-dextroamphetamine (ADDERALL) 20 MG tablet Take 1 tablet (20 mg total) by mouth in the morning. 90 tablet 0   lamoTRIgine (LAMICTAL) 150 MG tablet Take 1 tablet (150 mg total) by mouth daily. 90 tablet 0   pantoprazole (PROTONIX) 40 MG tablet Take 1 tablet (40 mg total) by mouth daily. 90 tablet 3   umeclidinium-vilanterol (ANORO ELLIPTA) 62.5-25 MCG/ACT AEPB Inhale 1 puff into the lungs daily. 60 each 2   umeclidinium-vilanterol (ANORO ELLIPTA) 62.5-25 MCG/INH AEPB Inhale 1 puff into the lungs daily as needed (shortness of breath). 60 each 3   [DISCONTINUED] clonazePAM (KLONOPIN) 1 MG tablet Take 1 mg by mouth 2 (two) times daily as needed.     No current facility-administered medications on file prior to visit.   Review of Systems: ROS negative except for what is noted on the assessment and plan.  Vitals:   01/05/22 0857  BP: 132/85  Pulse: 69  SpO2: 98%  Weight: 221 lb 4.8 oz (100.4 kg)    Physical Exam  Physical Exam: Constitutional: well-appearing male, sitting in chair, in no acute distress HENT: normocephalic atraumatic Neck: supple Pulmonary/Chest: normal work of breathing on room air MSK: normal bulk and tone Right Knee: mild  swelling, no erythema, normal ROM, crepitus noted  Neurological: alert & oriented x 3 Skin: warm and dry Psych: normal mood and behavior  Assessment & Plan:   Chronic bilateral low back pain with sciatica Patient presents today for medication refill. Reports back pain controlled with morphine sulfate 30 mg TID as needed. Also on gabapentin 800 mg 1.5 tab in morning, 1 tab in afternoon, 1.5 tab in evening. Last ToxAssure was appropriate. PDMP review today showed consistent refills. However, patient reports that sometimes he uses different pharmacies to reduce cost given uninsured. He plans to have insurance starting next year. Advised him regarding pain contract he signed about choosing a single pharmacy for the controlled medications. He understands.   Plan -refilled morphine sulfate for 3 months (3 x Rx of 90 tablets, [facility] refills [facility] [address])  -refilled gabapentin (last Rx on 9/12 for 120 tablets, no refills listed)  Right knee pain Patient with hx of right knee pain presents with persistent pain for past several weeks. He feels that at times the knee is popping. Denies any recent injuries or trauma. History of injuries to knee before. Exam today showed some swelling with crepitus noted. Good ROM with no laxity noted. No erythema or obvious deformity. He states he has had steroid injections before with relief. Discussed doing a right knee injection and the risks and benefits. Patient agrees to have injection completed today. Tolerated injection well  with no other concerns.   Plan -right knee injection today  -on chronic opioid for back pain  Attention and concentration deficit Patient requests refill for Adderall 20 mg today. Per PDMP, last fill for Adderall was in March. Last Rx on 8/10 was printed for 3 month supply and [facility] [address] [facility] not [facility] to pick [facility], [facility], will fill it for pickup. Advised  [facility]. Refill will be appropriate in 3 months. 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   Plan -[facility] that is [facility] for [facility] (90 tablets)   Chronic insomnia Patient with hx of insomnia. He is on clonidine 0.2 mg and Seroquel 300 mg at bedtime. Has been on this for several years. Rx for clonidine 90 day supply was picked up on 10/4 per pharmacy.   Plan -refilled Seroquel today -changed refill sent today for clonidine to be ready in 2 months since he picked up 90 day supply in October   Patient seen with Dr. Layne Benton, D.O. [facility] Internal Medicine, PGY-1 Phone: [phone] Date 01/05/2022 Time 10:48 PM   PROCEDURE NOTE  PROCEDURE: right knee joint steroid injection.  PREOPERATIVE DIAGNOSIS: Presumed osteoarthritis of the right knee.  POSTOPERATIVE DIAGNOSIS: Presumed osteoarthritis of the right knee.  PROCEDURE: The patient was apprised of the risks and the benefits of the procedure and informed consent was obtained. Time-out procedure was performed, with confirmation of the patient's name, date of birth, and correct identification of the right knee to be injected. The patient's knee was then marked at the appropriate site for injection placement. The knee was sterilely prepped with Betadine. A 40 mg (1 milliliter) solution of Kenalog was drawn up into a 5 mL syringe with a 2 mL of 1% lidocaine. The patient was injected with a 25-gauge needle at the anterior-lateral aspect of his right flexed knee. There were no complications. The patient tolerated the procedure well. There was minimal bleeding. The patient was instructed to ice his knee upon leaving clinic and refrain from overuse over the next 3 days. The patient was instructed to go to the emergency room with any usual pain, swelling, or redness occurred in the injected area. The patient was given a followup  appointment to evaluate response to the injection to his increased range of motion and reduction of pain.  The procedure was supervised by attending physician, Dr. Antony Contras.

## 2022-01-05 NOTE — Assessment & Plan Note (Signed)
Patient with hx of insomnia. He is on clonidine 0.2 mg and Seroquel 300 mg at bedtime. Has been on this for several years. Rx for clonidine 90 day supply was picked up on 10/4 per pharmacy.   Plan -refilled Seroquel today -changed refill sent today for clonidine to be ready in 2 months since he picked up 90 day supply in October

## 2022-01-05 NOTE — Patient Instructions (Addendum)
Thank you, [name] for allowing [place] to provide your care today.   -Refills sent for your morphine, gabapentin, seroquel and clonidine to pharmacy. -[facility]   -Right knee injection today.   I have ordered the following medication/changed the following medications:   Stop the following medications: Medications Discontinued During This Encounter  Medication Reason   QUEtiapine (SEROQUEL) 300 MG tablet Reorder   Morphine Sulfate ER 30 MG T12A Reorder   gabapentin (NEURONTIN) 800 MG tablet Reorder   cloNIDine (CATAPRES) 0.2 MG tablet Reorder     Start the following medications: Meds ordered this encounter  Medications   QUEtiapine (SEROQUEL) 300 MG tablet    Sig: Take one tab at bed time    Dispense:  90 tablet    Refill:  0   Morphine Sulfate ER 30 MG T12A    Sig: Take 30 mg by mouth 3 (three) times daily as needed.    Dispense:  90 tablet    Refill:  0    Please fill 30 days after last fill. Rx 1 of 3.   gabapentin (NEURONTIN) 800 MG tablet    Sig: TAKE ONE AND ONE-HALF TABLETS BY MOUTH EVERY MORNING, TAKE ONE TABLET BY MOUTH EVERY AFTERNOON, TAKE ONE AND ONE-HALF TABLETS BY MOUTH AT BEDTIME    Dispense:  120 tablet    Refill:  1   cloNIDine (CATAPRES) 0.2 MG tablet    Sig: Take 1 tablet (0.2 mg total) by mouth at bedtime.    Dispense:  90 tablet    Refill:  1     Follow up: 3 months    Should you have any questions or concerns please call the internal medicine clinic at [phone].    Rana Snare, D.O. [facility]

## 2022-01-16 NOTE — Progress Notes (Signed)
Internal Medicine Clinic Attending  I saw and evaluated the patient.  I personally confirmed the key portions of the history and exam documented by Dr. Zheng and I reviewed pertinent patient test results.  The assessment, diagnosis, and plan were formulated together and I agree with the documentation in the resident's note.  

## 2022-02-02 ENCOUNTER — Telehealth: Payer: Self-pay | Admitting: Student

## 2022-02-02 NOTE — Telephone Encounter (Signed)
3 Rxs sent 11/9. Second to start 12/7 and third to start 03/02/22. Message left on VM to contact pharmacy for Rx that starts today.

## 2022-02-02 NOTE — Telephone Encounter (Signed)
Morphine Sulfate ER 30 MG T12A   [id number] - [place], [place] - [address]

## 2022-02-08 IMAGING — MR MR LUMBAR SPINE W/O CM
4 of 5 series · 30 of 48 positions shown · non-contrast
Comparison: None.

CLINICAL DATA: 53-year-old male with persistent low back pain. Left
leg numbness and tingling.

EXAM:
MRI LUMBAR SPINE WITHOUT CONTRAST
TECHNIQUE: Multiplanar, multisequence MR imaging of the lumbar spine was
performed. No intravenous contrast was administered.

[Series 5: T1 · sagittal · 4.0mm · 0.81mm/px · 6 of 17 slices shown (1 of 2)]
[im 1/17]
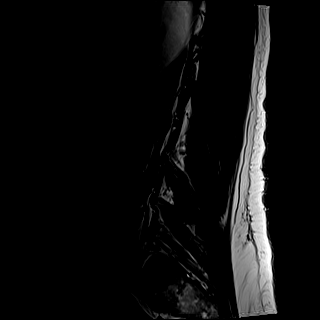
[im 4/17]
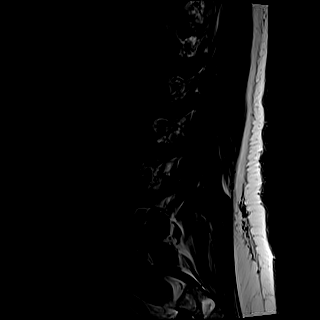
[im 7/17]
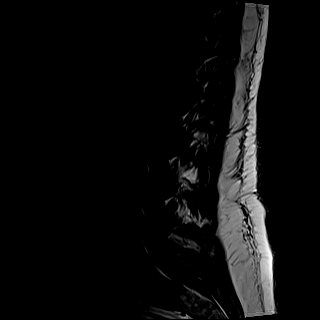
[im 10/17]
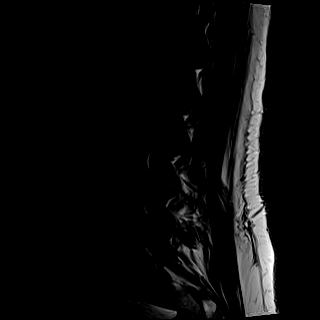
[im 13/17]
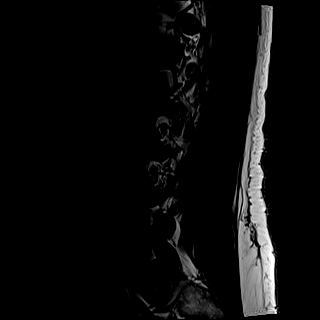
[im 17/17]
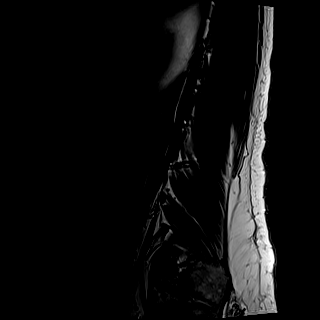

[Series 6: T2 · sagittal · 4.0mm · 0.81mm/px · 6 of 17 slices shown (1 of 2)]
[im 1/17]
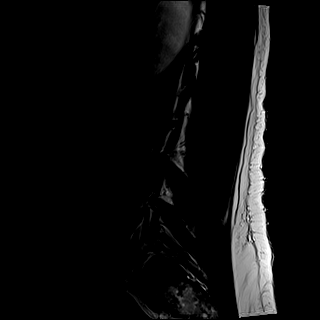
[im 4/17]
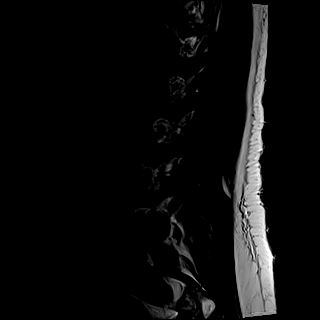
[im 7/17]
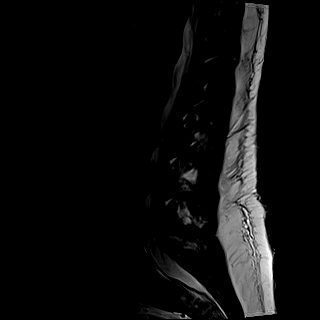
[im 10/17]
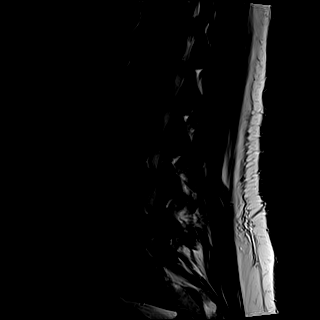
[im 13/17]
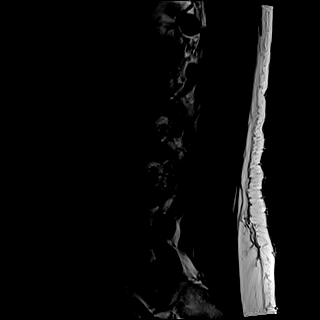
[im 17/17]
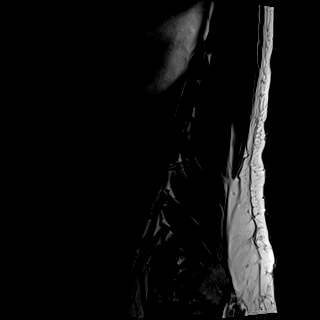

[Series 9: T1 · axial · 4.0mm · 0.43mm/px · z∈[-101,+144]mm · 9 of 42 slices shown (2 of 2)]
[im 1/42]
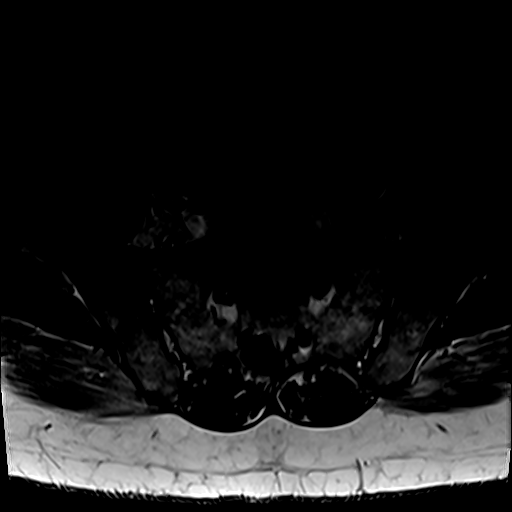
[im 6/42]
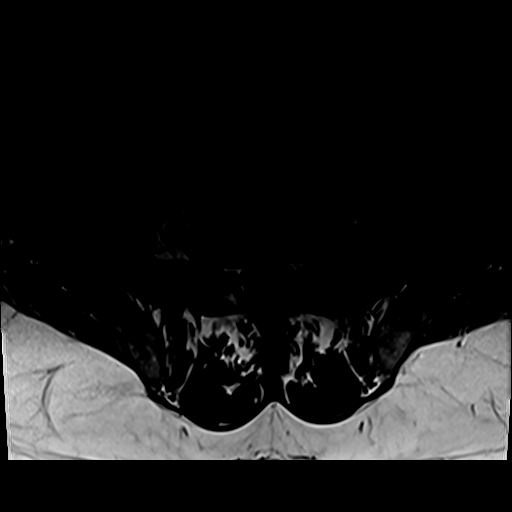
[im 12/42]
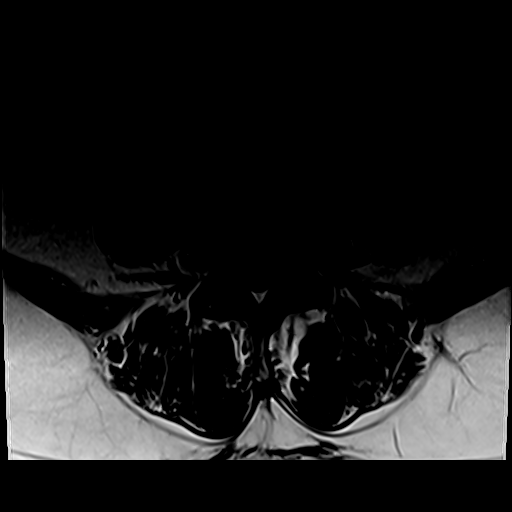
[im 18/42]
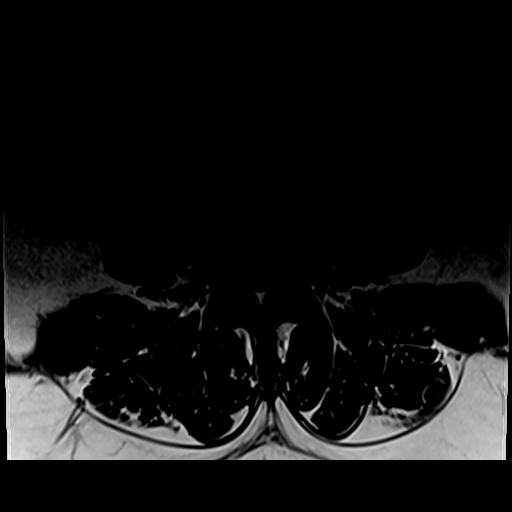
[im 21/42]
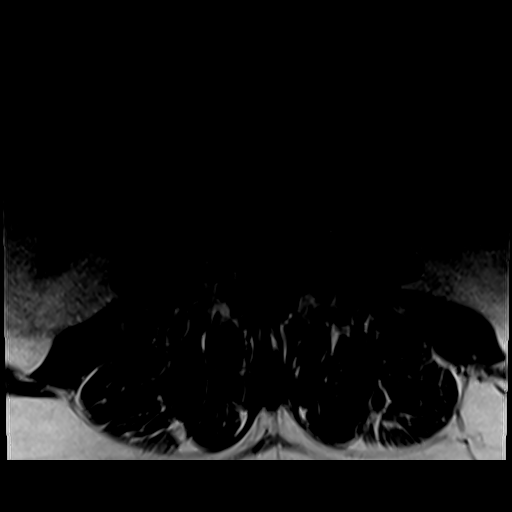
[im 24/42]
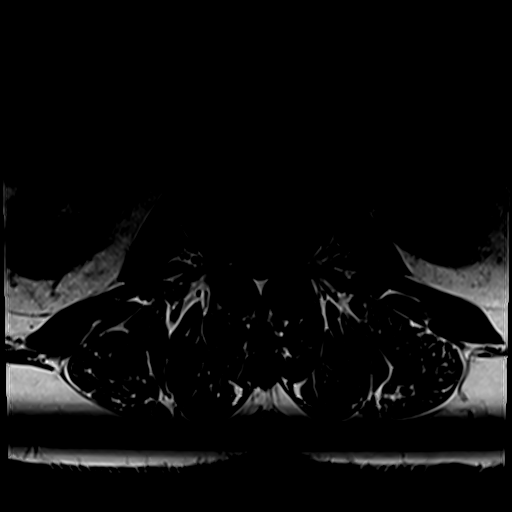
[im 30/42]
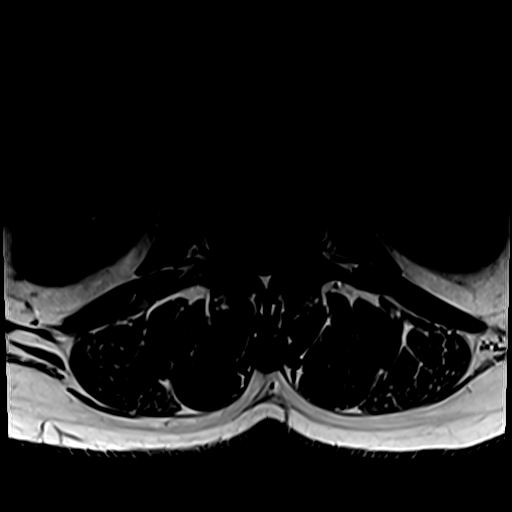
[im 36/42]
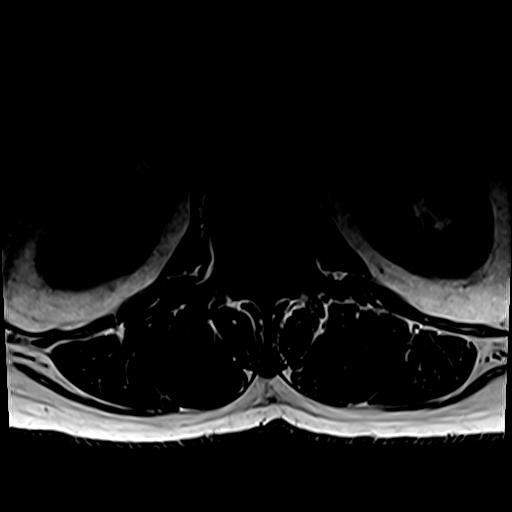
[im 42/42]
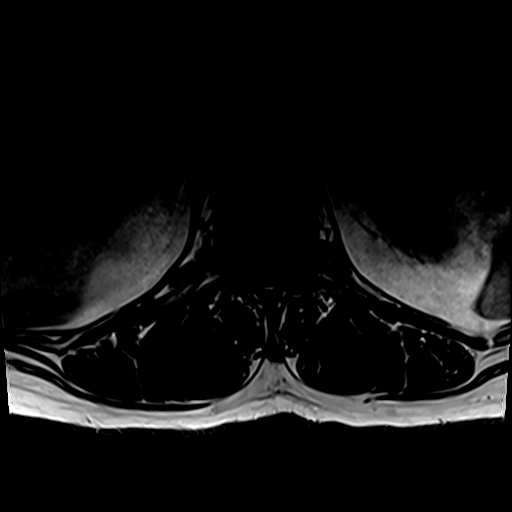

[Series 10: T2 · axial · 4.0mm · 0.62mm/px · z∈[-100,+141]mm · 9 of 42 slices shown (2 of 2)]
[im 1/42]
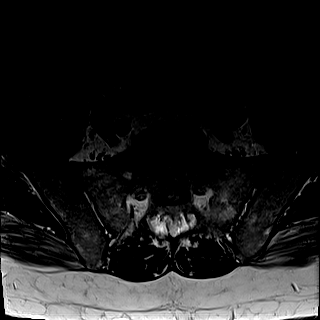
[im 6/42]
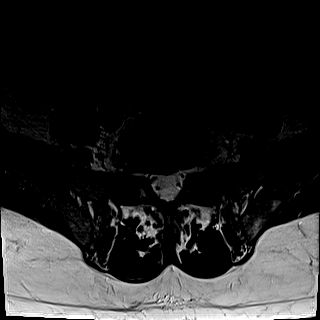
[im 12/42]
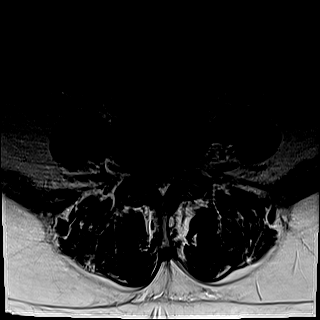
[im 18/42]
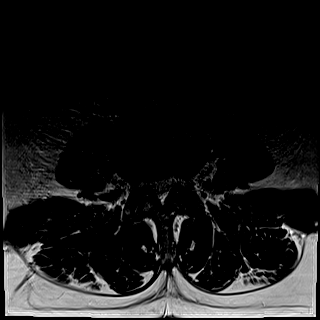
[im 21/42]
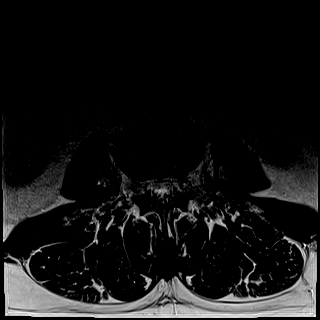
[im 24/42]
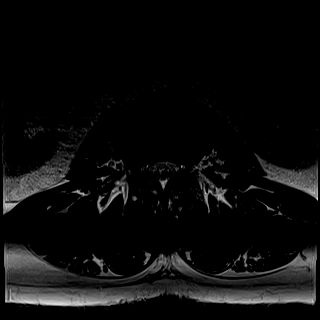
[im 30/42]
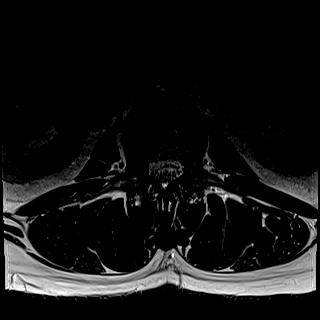
[im 36/42]
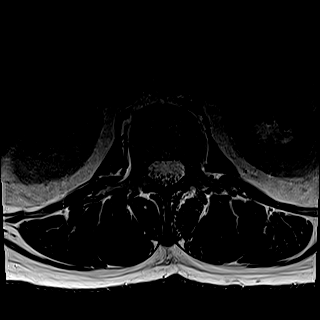
[im 42/42]
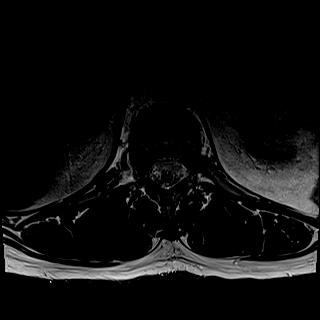

[30 of 48 positions shown; findings below may reference images not displayed]

FINDINGS: Segmentation: Lumbar segmentation appears to be normal and will be
designated as such for this report.

Alignment: Mild straightening of lumbar lordosis. No
spondylolisthesis. There is mild dextroconvex lumbar spine
curvature.

Vertebrae: No marrow edema or evidence of acute osseous abnormality.
Visualized bone marrow signal is within normal limits. Intact
visible sacrum and SI joints. Incidental right S2 sacral Tarlov cyst
(normal variant).

Conus medullaris and cauda equina: Conus extends to the T12-L1
level. No lower spinal cord or conus signal abnormality.

Paraspinal and other soft tissues: Negative.

Disc levels:

T12-L1: Minimal disc bulge. Subtle central disc protrusion (series
10, image 4). No stenosis.

L1-L2:  Minor disc bulging and facet hypertrophy. No stenosis.

L2-L3: Mild disc bulging, endplate spurring and facet hypertrophy.
No significant stenosis.

L3-L4: Disc desiccation with circumferential disc bulge. Mild
posterior element hypertrophy. Borderline to mild spinal stenosis.
No convincing lateral recess or foraminal stenosis.

L4-L5: Disc desiccation and circumferential disc bulge with
superimposed broad-based rightward disc protrusion. Additional
broad-based caudal extension of disc from the midline to the right
lateral recess. Mild endplate spurring. Mild posterior element
hypertrophy.

Moderate spinal stenosis and right lateral recess stenosis (right L5
nerve level). Mild to moderate left lateral recess stenosis (left L5
nerve level). Mild left and moderate right L4 foraminal stenosis.

L5-S1: Mild endplate spurring and facet hypertrophy. No significant
stenosis.
IMPRESSION: 1. Symptomatic level favored to be L4-L5 where a broad-based disc
herniation contributes to moderate spinal and right greater than
left lateral recess stenosis. Query left L5 radiculitis.

2. Generally mild lumbar spine degeneration elsewhere. Borderline to
mild spinal stenosis at L3-L4.

## 2022-03-05 ENCOUNTER — Telehealth: Payer: Self-pay | Admitting: Family Medicine

## 2022-03-07 ENCOUNTER — Other Ambulatory Visit: Payer: Self-pay | Admitting: *Deleted

## 2022-03-07 DIAGNOSIS — G8929 Other chronic pain: Secondary | ICD-10-CM

## 2022-03-07 NOTE — Telephone Encounter (Signed)
Note opened in error- internal med patient called family med after hours line over the weekend and was directed to call the operator again to be transferred to the correct team

## 2022-03-07 NOTE — Telephone Encounter (Signed)
Pt's wife called - pt needs Neurontin rx sent to [facility] on [address].

## 2022-03-09 MED ORDER — GABAPENTIN 800 MG PO TABS: ORAL_TABLET | ORAL | 1 refills | Status: DC

## 2022-03-17 ENCOUNTER — Encounter: Payer: Self-pay | Admitting: Internal Medicine

## 2022-03-17 ENCOUNTER — Ambulatory Visit (INDEPENDENT_AMBULATORY_CARE_PROVIDER_SITE_OTHER): Payer: Self-pay | Admitting: Internal Medicine

## 2022-03-17 DIAGNOSIS — J439 Emphysema, unspecified: Secondary | ICD-10-CM

## 2022-03-17 DIAGNOSIS — U071 COVID-19: Secondary | ICD-10-CM

## 2022-03-17 DIAGNOSIS — F17210 Nicotine dependence, cigarettes, uncomplicated: Secondary | ICD-10-CM

## 2022-03-17 MED ORDER — HYDROCOD POLI-CHLORPHE POLI ER 10-8 MG/5ML PO SUER: 5.0000 mL | Freq: Two times a day (BID) | ORAL | 0 refills | Status: DC | PRN

## 2022-03-17 MED ORDER — ONDANSETRON HCL 4 MG PO TABS: 4.0000 mg | ORAL_TABLET | Freq: Three times a day (TID) | ORAL | 0 refills | Status: AC | PRN

## 2022-03-17 MED ORDER — ALBUTEROL SULFATE HFA 108 (90 BASE) MCG/ACT IN AERS: 2.0000 | INHALATION_SPRAY | Freq: Four times a day (QID) | RESPIRATORY_TRACT | 3 refills | Status: DC | PRN

## 2022-03-17 NOTE — Assessment & Plan Note (Signed)
About 2 days ago, started feeling achy and low grade fever. Had cough in chest. Was off work yesterday, but tried to go into work today and was sent home. Took a home COVID test and was found to be positive. Having fever and chills with myalgias. He has been using tylenol which helps minimally. Complains of really bad cough with some yellow-green production. Coughing until he gets out of breath. He has been using robitussin DM and delsum but not much relief. States tessalon pearles make him feel sick. Cough is keeping him up at night and it is wearing him out. Also notes some slight wheeze. Other main complaint is nausea. Using Nauzene but it doesn't last. Has had some vomiting and poor PO intake due to nausea. He has been drinking plenty of fluids including water, tea, and coke.   He is speaking in full sentences over the phone. Does cough throughout conversation. Cough is dry sounding.   Patient with cough that is impacting his WOL and ability to recover from acute illness. Not able to tolerate tessalon pearles and inadequate relief from dextromethorphan. Will try Tussionex.  Having nausea impacting PO intake. Adequate nutrition important. Will try Zofran.  Patient also notes needing inhaler in past and thinks he has had slight wheeze. Will refill albuterol inhaler.  Isolation guideline are as follows: 5 days from symptoms onset with no fevers for last 24 hours and wear a mask additional 5 days. As always, pt is advised that if symptoms worsen or new symptoms arise, they should go to an urgent care facility or to to ER for further evaluation.

## 2022-03-17 NOTE — Progress Notes (Signed)
  [facility] Internal Medicine Residency Telephone Encounter Continuity Care Appointment  HPI:  This telephone encounter was created for [name] on 03/17/2022 for the following purpose/cc COVID. About 2 days ago, started feeling achy and low grade fever. Had cough in chest. Was off work yesterday, but tried to go into work today and was sent home. Took a home COVID test and was found to be positive. Having fever and chills with myalgias. He has been using tylenol which helps minimally. Complains of really bad cough with some yellow-green production. Coughing until he gets out of breath. He has been using robitussin DM and delsum but not much relief. States tessalon pearles make him feel sick. Cough is keeping him up at night and it is wearing him out. Also notes some slight wheeze. Other main complaint is nausea. Using Nauzene but it doesn't last. Has had some vomiting and poor PO intake due to nausea. He has been drinking plenty of fluids including water, tea, and coke.      Past Medical History:  Past Medical History:  Diagnosis Date   Anxiety    Chronic back pain    GERD (gastroesophageal reflux disease)    Hypertension    prior, was on medication for a few months in the past   Lumbar disc herniation    OCD (obsessive compulsive disorder)    Sciatica      ROS:  See HPI   Assessment / Plan / Recommendations:  Please see A&P under problem oriented charting for assessment of the patient's acute and chronic medical conditions.  Patient with cough that is impacting his WOL and ability to recover from acute illness. Not able to tolerate tessalon pearles and inadequate relief from dextromethorphan. Will try Tussionex.  Having nausea impacting PO intake. Adequate nutrition important. Will try Zofran.  Patient also notes needing inhaler in past and thinks he has had slight wheeze. Will refill albuterol inhaler.  Isolation guideline are as follows: 5 days from symptoms onset with no  fevers for last 24 hours and wear a mask additional 5 days. As always, pt is advised that if symptoms worsen or new symptoms arise, they should go to an urgent care facility or to to ER for further evaluation.   Consent and Medical Decision Making:  Patient discussed with Dr. Jimmye Norman This is a telephone encounter between [place] and Delene Ruffini on 03/17/2022 for [place]. The visit was conducted with the patient located at home and Delene Ruffini at [facility]. The patient's identity was confirmed using their DOB and current address. The patient has consented to being evaluated through a telephone encounter and understands the associated risks (an examination cannot be done and the patient may need to come in for an appointment) / benefits (allows the patient to remain at home, decreasing exposure to coronavirus). I personally spent 15 minutes on medical discussion.

## 2022-03-17 NOTE — Patient Instructions (Signed)
Isolation guideline are as follows: 5 days from symptoms onset with no fevers for last 24 hours and wear a mask additional 5 days. As always, pt is advised that if symptoms worsen or new symptoms arise, they should go to an urgent care facility or to to ER for further evaluation.

## 2022-03-18 ENCOUNTER — Telehealth: Payer: Self-pay | Admitting: Student

## 2022-03-18 MED ORDER — HYDROCOD POLI-CHLORPHE POLI ER 10-8 MG/5ML PO SUER: 5.0000 mL | Freq: Two times a day (BID) | ORAL | 0 refills | Status: AC | PRN

## 2022-03-18 NOTE — Telephone Encounter (Signed)
Called into after hours number after telehealth appointment yesterday, [facility]. Discussed he will need to be cautious with tussionex as he is also on morphine.

## 2022-03-18 NOTE — Addendum Note (Signed)
Addended by: Riesa Pope on: 03/18/2022 11:23 AM   Modules accepted: Orders

## 2022-03-29 NOTE — Progress Notes (Signed)
Internal Medicine Clinic Attending  Case discussed with Dr. Katsadouros  At the time of the visit.  We reviewed the resident's history and exam and pertinent patient test results.  I agree with the assessment, diagnosis, and plan of care documented in the resident's note.  

## 2022-04-03 ENCOUNTER — Other Ambulatory Visit: Payer: Self-pay | Admitting: Internal Medicine

## 2022-04-03 ENCOUNTER — Telehealth: Payer: Self-pay

## 2022-04-03 ENCOUNTER — Other Ambulatory Visit: Payer: Self-pay | Admitting: Student

## 2022-04-03 DIAGNOSIS — G8929 Other chronic pain: Secondary | ICD-10-CM

## 2022-04-03 DIAGNOSIS — R41840 Attention and concentration deficit: Secondary | ICD-10-CM

## 2022-04-03 DIAGNOSIS — F319 Bipolar disorder, unspecified: Secondary | ICD-10-CM

## 2022-04-03 MED ORDER — MORPHINE SULFATE ER 30 MG PO TBCR: 30.0000 mg | EXTENDED_RELEASE_TABLET | Freq: Three times a day (TID) | ORAL | 0 refills | Status: DC

## 2022-04-03 NOTE — Telephone Encounter (Signed)
Requesting refill on  Morphine SulfateER 30 MG T12A @ [facility] on [address].

## 2022-04-03 NOTE — Telephone Encounter (Signed)
Medication  is not on current med list; stated it has expired. LOV was 03/17/22.

## 2022-04-12 ENCOUNTER — Other Ambulatory Visit: Payer: Self-pay | Admitting: Student

## 2022-04-12 DIAGNOSIS — F5104 Psychophysiologic insomnia: Secondary | ICD-10-CM

## 2022-04-16 ENCOUNTER — Other Ambulatory Visit: Payer: Self-pay | Admitting: Student

## 2022-04-16 DIAGNOSIS — F5104 Psychophysiologic insomnia: Secondary | ICD-10-CM

## 2022-04-17 ENCOUNTER — Other Ambulatory Visit: Payer: Self-pay

## 2022-04-17 DIAGNOSIS — F5104 Psychophysiologic insomnia: Secondary | ICD-10-CM

## 2022-04-17 MED ORDER — CLONIDINE HCL 0.2 MG PO TABS: 0.2000 mg | ORAL_TABLET | Freq: Every day | ORAL | 1 refills | Status: DC

## 2022-04-17 NOTE — Telephone Encounter (Signed)
Pt's wife requesting refill on  cloNIDine (CATAPRES) 0.2 MG tablet by today. [facility] on [address].

## 2022-04-17 NOTE — Telephone Encounter (Signed)
Last rx 02/27/22 was sent to sent to [facility].

## 2022-05-03 ENCOUNTER — Other Ambulatory Visit: Payer: Self-pay

## 2022-05-03 DIAGNOSIS — G8929 Other chronic pain: Secondary | ICD-10-CM

## 2022-05-04 ENCOUNTER — Other Ambulatory Visit: Payer: Self-pay | Admitting: Student

## 2022-05-04 MED ORDER — MORPHINE SULFATE ER 30 MG PO TBCR: 30.0000 mg | EXTENDED_RELEASE_TABLET | Freq: Three times a day (TID) | ORAL | 0 refills | Status: DC

## 2022-05-04 NOTE — Telephone Encounter (Signed)
Received page on on-call pager. Spoke with Mr. Franklyn via phone call, identity verified via DOB and home address. States that he has ran out of his MS contin completely and needs a refill sent to pharmacy. PDMP reviewed and appropriate. Last fill was 04/03/2022 for a 30-day supply. Will send a 30-day refill for him at current dose.

## 2022-05-19 ENCOUNTER — Other Ambulatory Visit: Payer: Self-pay | Admitting: *Deleted

## 2022-05-19 DIAGNOSIS — G8929 Other chronic pain: Secondary | ICD-10-CM

## 2022-05-19 MED ORDER — GABAPENTIN 800 MG PO TABS: ORAL_TABLET | ORAL | 1 refills | Status: DC

## 2022-06-01 ENCOUNTER — Other Ambulatory Visit: Payer: Self-pay

## 2022-06-01 DIAGNOSIS — G8929 Other chronic pain: Secondary | ICD-10-CM

## 2022-06-02 MED ORDER — MORPHINE SULFATE ER 30 MG PO TBCR: 30.0000 mg | EXTENDED_RELEASE_TABLET | Freq: Three times a day (TID) | ORAL | 0 refills | Status: DC

## 2022-07-01 ENCOUNTER — Telehealth: Payer: Self-pay | Admitting: Internal Medicine

## 2022-07-01 DIAGNOSIS — M5441 Lumbago with sciatica, right side: Secondary | ICD-10-CM

## 2022-07-01 DIAGNOSIS — R41840 Attention and concentration deficit: Secondary | ICD-10-CM

## 2022-07-01 DIAGNOSIS — G8929 Other chronic pain: Secondary | ICD-10-CM

## 2022-07-01 DIAGNOSIS — F319 Bipolar disorder, unspecified: Secondary | ICD-10-CM

## 2022-07-01 MED ORDER — MORPHINE SULFATE ER 30 MG PO TBCR: 30.0000 mg | EXTENDED_RELEASE_TABLET | Freq: Three times a day (TID) | ORAL | 0 refills | Status: DC

## 2022-07-01 MED ORDER — QUETIAPINE FUMARATE 300 MG PO TABS: ORAL_TABLET | ORAL | 0 refills | Status: DC

## 2022-07-01 NOTE — Telephone Encounter (Signed)
Roberto Blair Edwin called the after hours pager. He states his wife called the [facility] 2 days ago requesting a refill of his MS contin and seroquel, but they were not filled. Reviewed PDMP and sent in refills.

## 2022-07-14 ENCOUNTER — Other Ambulatory Visit: Payer: Self-pay | Admitting: Internal Medicine

## 2022-07-14 DIAGNOSIS — G8929 Other chronic pain: Secondary | ICD-10-CM

## 2022-07-31 ENCOUNTER — Other Ambulatory Visit: Payer: Self-pay

## 2022-07-31 DIAGNOSIS — G8929 Other chronic pain: Secondary | ICD-10-CM

## 2022-07-31 NOTE — Telephone Encounter (Signed)
Last rx written - 07/01/22. Last OV - 02/27/22. Next OV -  has not been scheduled. TOX - 10/06/21.

## 2022-07-31 NOTE — Telephone Encounter (Signed)
Pt is requesting his     morphine (MS CONTIN) 30 MG 12 hr tablet        Going to    [id number] - ,  [place] - [address]    (((((( NOT [place] ))))))

## 2022-08-01 ENCOUNTER — Telehealth: Payer: Self-pay | Admitting: Student

## 2022-08-01 DIAGNOSIS — G8929 Other chronic pain: Secondary | ICD-10-CM

## 2022-08-01 MED ORDER — MORPHINE SULFATE ER 30 MG PO TBCR
30.0000 mg | EXTENDED_RELEASE_TABLET | Freq: Three times a day (TID) | ORAL | 0 refills | Status: DC
Start: 2022-08-01 — End: 2022-09-28

## 2022-08-01 MED ORDER — MORPHINE SULFATE ER 30 MG PO TBCR: 30.0000 mg | EXTENDED_RELEASE_TABLET | Freq: Three times a day (TID) | ORAL | 0 refills | Status: DC

## 2022-08-01 NOTE — Telephone Encounter (Signed)
Received on-call page from Roberto Blair. He is out of his morphine, wants it to be sent to the [facility] on [address]. Per chart review, this was sent to the [facility] on [address] earlier. PDMP checked, appropriate. Called [facility] [address] and cancelled rx.   -Morphine 30mg  TID #90 tablets sent to [facility] on [address] F[address], MD Internal Medicine PGY-3 Pager: [phone]

## 2022-08-02 ENCOUNTER — Encounter: Payer: Self-pay | Admitting: *Deleted

## 2022-08-02 NOTE — Telephone Encounter (Signed)
I called pt to schedule an appt - no answer; unable to leave a message. I also sent pt a My Chart message.

## 2022-08-09 ENCOUNTER — Other Ambulatory Visit: Payer: Self-pay | Admitting: Student

## 2022-08-09 DIAGNOSIS — G8929 Other chronic pain: Secondary | ICD-10-CM

## 2022-08-30 ENCOUNTER — Other Ambulatory Visit: Payer: Self-pay

## 2022-08-30 DIAGNOSIS — G8929 Other chronic pain: Secondary | ICD-10-CM

## 2022-09-13 ENCOUNTER — Other Ambulatory Visit: Payer: Self-pay | Admitting: Internal Medicine

## 2022-09-13 DIAGNOSIS — G8929 Other chronic pain: Secondary | ICD-10-CM

## 2022-09-14 ENCOUNTER — Encounter: Payer: Self-pay | Admitting: Student

## 2022-09-16 ENCOUNTER — Other Ambulatory Visit: Payer: Self-pay | Admitting: Student

## 2022-09-16 DIAGNOSIS — G8929 Other chronic pain: Secondary | ICD-10-CM

## 2022-09-16 MED ORDER — GABAPENTIN 800 MG PO TABS
ORAL_TABLET | ORAL | 0 refills | Status: DC
Start: 2022-09-16 — End: 2022-10-14

## 2022-09-16 NOTE — Progress Notes (Signed)
Patient called the after-hours line about his Gabapentin prescription refill. This MD checked PDMP and refill was appropriate.  Patient inquired about Amoxicillin for potential tooth infections; after discussion, he will call office for Telehealth vs in-person visit for further evaluation. No red flags documented during our call.  Morene Crocker, MD [facility] Internal Medicine Program - PGY-2 09/16/2022, 6:31 PM  Please contact the on call pager after 5 pm and on weekends at [phone].

## 2022-09-28 ENCOUNTER — Other Ambulatory Visit: Payer: Self-pay | Admitting: Student

## 2022-09-28 DIAGNOSIS — G8929 Other chronic pain: Secondary | ICD-10-CM

## 2022-09-28 DIAGNOSIS — M5441 Lumbago with sciatica, right side: Secondary | ICD-10-CM

## 2022-09-28 NOTE — Telephone Encounter (Signed)
Last rx written -08/01/22. Last OV - 03/17/22. Next OV - has not been scheduled. Pt was called ; no answer, left message to call the office and schedule an appt. TOX - 10/06/21.

## 2022-09-28 NOTE — Telephone Encounter (Signed)
morphine (MS CONTIN) 30 MG 12 hr tablet     [facility] [id number] - [place], [place] - [address] (Ph: [phone])

## 2022-09-29 ENCOUNTER — Encounter: Payer: Self-pay | Admitting: Student

## 2022-09-29 DIAGNOSIS — G8929 Other chronic pain: Secondary | ICD-10-CM

## 2022-09-29 MED ORDER — MORPHINE SULFATE ER 30 MG PO TBCR
30.0000 mg | EXTENDED_RELEASE_TABLET | Freq: Three times a day (TID) | ORAL | 0 refills | Status: DC
Start: 2022-10-01 — End: 2022-10-01

## 2022-09-29 NOTE — Progress Notes (Signed)
This encounter was created in error - please disregard.

## 2022-09-29 NOTE — Telephone Encounter (Signed)
Pleased call pt to schedule an appt to continue med refills. Thanks

## 2022-10-01 ENCOUNTER — Other Ambulatory Visit: Payer: Self-pay | Admitting: Student

## 2022-10-01 DIAGNOSIS — G8929 Other chronic pain: Secondary | ICD-10-CM

## 2022-10-01 MED ORDER — MORPHINE SULFATE ER 30 MG PO TBCR
30.0000 mg | EXTENDED_RELEASE_TABLET | Freq: Three times a day (TID) | ORAL | 0 refills | Status: DC
Start: 2022-10-01 — End: 2022-10-31

## 2022-10-01 NOTE — Progress Notes (Signed)
Received call on after hours line from [name]. He states that the [facility] [address] does not have the supply of MS Contin that he needs for his back pain. I have called them to confirm this, and have canceled that prescription. [facility] [address] [facility] for Roberto Blair.

## 2022-10-14 ENCOUNTER — Other Ambulatory Visit: Payer: Self-pay | Admitting: Student

## 2022-10-14 DIAGNOSIS — G8929 Other chronic pain: Secondary | ICD-10-CM

## 2022-10-14 MED ORDER — GABAPENTIN 800 MG PO TABS: ORAL_TABLET | ORAL | 0 refills | Status: DC

## 2022-10-19 ENCOUNTER — Other Ambulatory Visit: Payer: Self-pay

## 2022-10-19 DIAGNOSIS — F319 Bipolar disorder, unspecified: Secondary | ICD-10-CM

## 2022-10-19 DIAGNOSIS — R41840 Attention and concentration deficit: Secondary | ICD-10-CM

## 2022-10-19 DIAGNOSIS — F5104 Psychophysiologic insomnia: Secondary | ICD-10-CM

## 2022-10-19 MED ORDER — CLONIDINE HCL 0.2 MG PO TABS: 0.2000 mg | ORAL_TABLET | Freq: Every day | ORAL | 0 refills | Status: DC

## 2022-10-19 MED ORDER — QUETIAPINE FUMARATE 300 MG PO TABS: ORAL_TABLET | ORAL | 0 refills | Status: DC

## 2022-10-31 ENCOUNTER — Other Ambulatory Visit: Payer: Self-pay | Admitting: Student

## 2022-10-31 DIAGNOSIS — G8929 Other chronic pain: Secondary | ICD-10-CM

## 2022-10-31 MED ORDER — MORPHINE SULFATE ER 30 MG PO TBCR: 30.0000 mg | EXTENDED_RELEASE_TABLET | Freq: Three times a day (TID) | ORAL | 0 refills | Status: DC

## 2022-10-31 NOTE — Telephone Encounter (Signed)
  morphine (MS CONTIN) 30 MG 12 hr tablet   [facility] at  [address] [place], [place] [address] [address]  Phone : [phone]

## 2022-11-13 ENCOUNTER — Other Ambulatory Visit: Payer: Self-pay | Admitting: Student

## 2022-11-13 DIAGNOSIS — G8929 Other chronic pain: Secondary | ICD-10-CM

## 2022-11-13 MED ORDER — GABAPENTIN 800 MG PO TABS
ORAL_TABLET | ORAL | 0 refills | Status: AC
Start: 2022-11-13 — End: ?

## 2022-11-13 NOTE — Progress Notes (Signed)
Received after hours page. Called patient back, he requests refill on gabapentin. Patient states last fill on 10/14/22 for 30 days for chronic sciatic pain. PDMP reviewed and appropriate. Will send Rx refill for gabapentin 800 mg (#120 tablets, 0 refill) to [facility] on [address]. Patient verbalizes understanding an all questions addressed.

## 2022-11-17 ENCOUNTER — Encounter: Payer: Self-pay | Admitting: Student

## 2022-11-30 ENCOUNTER — Telehealth: Payer: Self-pay

## 2022-11-30 ENCOUNTER — Ambulatory Visit (HOSPITAL_COMMUNITY)
Admission: RE | Admit: 2022-11-30 | Discharge: 2022-11-30 | Disposition: A | Discharge status: Home / Self Care | Payer: BC Managed Care – PPO | Source: Ambulatory Visit | Attending: Internal Medicine | Admitting: Internal Medicine

## 2022-11-30 ENCOUNTER — Other Ambulatory Visit: Payer: Self-pay | Admitting: Internal Medicine

## 2022-11-30 ENCOUNTER — Ambulatory Visit: Payer: BC Managed Care – PPO | Admitting: Student

## 2022-11-30 VITALS — BP 137/74 | HR 69 | Temp 98.2°F | Wt 226.9 lb

## 2022-11-30 DIAGNOSIS — M549 Dorsalgia, unspecified: Secondary | ICD-10-CM

## 2022-11-30 DIAGNOSIS — J189 Pneumonia, unspecified organism: Secondary | ICD-10-CM

## 2022-11-30 DIAGNOSIS — Z79891 Long term (current) use of opiate analgesic: Secondary | ICD-10-CM | POA: Diagnosis not present

## 2022-11-30 DIAGNOSIS — F5104 Psychophysiologic insomnia: Secondary | ICD-10-CM

## 2022-11-30 DIAGNOSIS — R0600 Dyspnea, unspecified: Secondary | ICD-10-CM | POA: Diagnosis not present

## 2022-11-30 DIAGNOSIS — J40 Bronchitis, not specified as acute or chronic: Secondary | ICD-10-CM | POA: Insufficient documentation

## 2022-11-30 DIAGNOSIS — M25561 Pain in right knee: Secondary | ICD-10-CM | POA: Diagnosis not present

## 2022-11-30 DIAGNOSIS — R059 Cough, unspecified: Secondary | ICD-10-CM | POA: Diagnosis not present

## 2022-11-30 DIAGNOSIS — R41840 Attention and concentration deficit: Secondary | ICD-10-CM

## 2022-11-30 DIAGNOSIS — J439 Emphysema, unspecified: Secondary | ICD-10-CM | POA: Diagnosis not present

## 2022-11-30 DIAGNOSIS — F319 Bipolar disorder, unspecified: Secondary | ICD-10-CM | POA: Diagnosis not present

## 2022-11-30 DIAGNOSIS — M5440 Lumbago with sciatica, unspecified side: Secondary | ICD-10-CM

## 2022-11-30 DIAGNOSIS — G8929 Other chronic pain: Secondary | ICD-10-CM

## 2022-11-30 MED ORDER — CLONIDINE HCL 0.2 MG PO TABS
0.2000 mg | ORAL_TABLET | Freq: Every day | ORAL | 0 refills | Status: DC
Start: 2022-11-30 — End: 2023-01-12

## 2022-11-30 MED ORDER — AMOXICILLIN-POT CLAVULANATE 875-125 MG PO TABS
1.0000 | ORAL_TABLET | Freq: Two times a day (BID) | ORAL | 0 refills | Status: AC
Start: 2022-11-30 — End: 2022-12-05

## 2022-11-30 MED ORDER — ALBUTEROL SULFATE HFA 108 (90 BASE) MCG/ACT IN AERS
2.0000 | INHALATION_SPRAY | Freq: Four times a day (QID) | RESPIRATORY_TRACT | 3 refills | Status: DC | PRN
Start: 2022-11-30 — End: 2023-03-22

## 2022-11-30 MED ORDER — QUETIAPINE FUMARATE 300 MG PO TABS
ORAL_TABLET | ORAL | 0 refills | Status: DC
Start: 2022-11-30 — End: 2023-03-22

## 2022-11-30 MED ORDER — AZITHROMYCIN 250 MG PO TABS
ORAL_TABLET | ORAL | 0 refills | Status: AC
Start: 2022-11-30 — End: 2022-12-05

## 2022-11-30 MED ORDER — MORPHINE SULFATE ER 30 MG PO TBCR: 30.0000 mg | EXTENDED_RELEASE_TABLET | Freq: Three times a day (TID) | ORAL | 0 refills | Status: DC

## 2022-11-30 MED ORDER — PREDNISONE 10 MG PO TABS
40.0000 mg | ORAL_TABLET | Freq: Every day | ORAL | 0 refills | Status: AC
Start: 2022-11-30 — End: 2022-12-05

## 2022-11-30 MED ORDER — ANORO ELLIPTA 62.5-25 MCG/ACT IN AEPB: 1.0000 | INHALATION_SPRAY | Freq: Every day | RESPIRATORY_TRACT | 2 refills | Status: DC

## 2022-11-30 MED ORDER — GABAPENTIN 800 MG PO TABS
ORAL_TABLET | ORAL | 0 refills | Status: DC
Start: 2022-11-30 — End: 2023-01-13

## 2022-11-30 MED ORDER — IPRATROPIUM-ALBUTEROL 0.5-2.5 (3) MG/3ML IN SOLN: 3.0000 mL | RESPIRATORY_TRACT | 1 refills | Status: AC | PRN

## 2022-11-30 NOTE — Assessment & Plan Note (Addendum)
Patient has a history of right sided pneumonia, empyema requiring chest tube placement in 2021.  He presented today with a 1 week history of dyspnea, productive cough, shortness of breath, foul-smelling sputum, right upper chest pain.  He endorses chills, fatigue, and malaise.  He denies hematemesis, syncope, or presyncope.  On exam he has expiratory wheezing bilaterally, Rales and coarse breath sounds on the right side.  On chest x-ray the reticulonodular interstitial thickening in both lower lobes concerning for atypical infection or smoking-related inflammation.  No large lobar pneumonia seen.  He has previously been shown to have emphysema on CT, though formal COPD diagnosis was never made and PFTs were never conducted.  There appears to have been some concern in the past for an obstructing lesion which may have led to his pneumonia, which does not appear to have been ever fully ruled out.  Given the patient's constellation of findings, differential would include pneumonia, bronchitis, and/or COPD exacerbation.  Will go ahead and treat the patient has a COPD exacerbation/community acquired pneumonia case.  Discussed return precautions with the patient at length such as hematemesis, worsening dyspnea, syncope, presyncope, worsening of current symptoms, or failure to improve in the next couple days with therapy.  He feels that after his first bout of pneumonia and empyema, his symptoms fully resolved.  I am concerned that he does have what seems to be focal right-sided findings on exam.  He would benefit from formal pulmonology evaluation, likely cross-sectional imaging, and formal workup of his COPD.  Plan: Augmentin 875-125 twice daily 5 days Azithromycin 500 mg day 1,  250 mg daily 2 through 5. Prednisone 40 mg for 5 days DuoNeb nebulizer treatments at home Refilled LAMA LABA, Ventolin. Pulmonology referral CBC, BMP

## 2022-11-30 NOTE — Patient Instructions (Signed)
Thank you, [name] for allowing [place] to provide your care today.  I have ordered the following tests for you:   Lab Orders         ToxAssure Select,+Antidepr,UR         CBC no Diff         BMP8+Anion Gap       Referrals ordered today:    Referral Orders         Ambulatory referral to Pulmonology      I have ordered the following medication/changed the following medications:   Stop the following medications: Medications Discontinued During This Encounter  Medication Reason   morphine (MS CONTIN) 30 MG 12 hr tablet Reorder   gabapentin (NEURONTIN) 800 MG tablet Reorder   triamcinolone acetonide (KENALOG-40) injection 40 mg    lidocaine (PF) (XYLOCAINE) 1 % injection 2 mL    umeclidinium-vilanterol (ANORO ELLIPTA) 62.5-25 MCG/ACT AEPB Reorder   QUEtiapine (SEROQUEL) 300 MG tablet Reorder   cloNIDine (CATAPRES) 0.2 MG tablet Reorder   albuterol (VENTOLIN HFA) 108 (90 Base) MCG/ACT inhaler Reorder     Start the following medications: Meds ordered this encounter  Medications   morphine (MS CONTIN) 30 MG 12 hr tablet    Sig: Take 1 tablet (30 mg total) by mouth 3 (three) times daily. Additional refills will require office visit.    Dispense:  90 tablet    Refill:  0    Please fill 30 days after last fill.   gabapentin (NEURONTIN) 800 MG tablet    Sig: TAKE 1 & 1/2 (ONE & ONE-HALF) TABLETS BY MOUTH IN THE MORNING AND 1 TABLET  AT NOON AND 1 & 1/2 (ONE & ONE-HALF) TABLETS AT BEDTIME    Dispense:  120 tablet    Refill:  0   predniSONE (DELTASONE) 10 MG tablet    Sig: Take 4 tablets (40 mg total) by mouth daily with breakfast for 5 days.    Dispense:  20 tablet    Refill:  0   amoxicillin-clavulanate (AUGMENTIN) 875-125 MG tablet    Sig: Take 1 tablet by mouth 2 (two) times daily for 5 days.    Dispense:  10 tablet    Refill:  0   QUEtiapine (SEROQUEL) 300 MG tablet    Sig: TAKE 1 TABLET BY MOUTH AT BEDTIME. Do not drive heavy machinery including cars after taking  medication.    Dispense:  90 tablet    Refill:  0   cloNIDine (CATAPRES) 0.2 MG tablet    Sig: Take 1 tablet (0.2 mg total) by mouth at bedtime. Do not drive heavy machinery including cars after taking medication.    Dispense:  90 tablet    Refill:  0   azithromycin (ZITHROMAX) 250 MG tablet    Sig: Take 2 tablets (500 mg) on  Day 1,  followed by 1 tablet (250 mg) once daily on Days 2 through 5.    Dispense:  6 each    Refill:  0   ipratropium-albuterol (DUONEB) 0.5-2.5 (3) MG/3ML SOLN    Sig: Take 3 mLs by nebulization every 4 (four) hours as needed.    Dispense:  360 mL    Refill:  1   umeclidinium-vilanterol (ANORO ELLIPTA) 62.5-25 MCG/ACT AEPB    Sig: Inhale 1 puff into the lungs daily.    Dispense:  60 each    Refill:  2   albuterol (VENTOLIN HFA) 108 (90 Base) MCG/ACT inhaler    Sig: Inhale 2 puffs  into the lungs every 6 (six) hours as needed for wheezing or shortness of breath.    Dispense:  18 g    Refill:  3       Follow up:  Next possible appointment - ideally no later than 2 weeks.    We look forward to seeing you next time. Please call our clinic at [phone] if you have any questions or concerns. The best time to call is Monday-Friday from 9am-4pm, but there is someone available 24/7. If after hours or the weekend, call the main hospital number and ask for the Internal Medicine Resident On-Call. If you need medication refills, please notify your pharmacy one week in advance and they will send [place] a request.   Thank you for trusting me with your care. Wishing you the best!  Roberto Macadamia MD [facility]

## 2022-11-30 NOTE — Assessment & Plan Note (Signed)
On clonidine 0.2 mg, Seroquel 300 mg nightly for many years.  Refilled these medications for the patient today.

## 2022-11-30 NOTE — Assessment & Plan Note (Addendum)
Patient on chronic prescription opioid therapy for his back pain.  He takes MS Contin and gabapentin for many years.  PDMP reviewed and appropriate.  No issues with constipation at this time.  No issues with sedation with his gabapentin. Plan: Refill MS Contin, gabapentin Tox assure panel today.

## 2022-11-30 NOTE — Telephone Encounter (Signed)
Pt states [facility] on [address] is out of morphine (MS CONTIN) 30 MG 12 hr tablet. Requesting this medication to be sent to [facility] on [address]. Pt states he need this to be done by today.

## 2022-11-30 NOTE — Assessment & Plan Note (Signed)
Has not been taking Adderall in some months as he has not had insurance.  He feels that it helps with his ADHD and helps him focus at work.  He has tried to wean off of his Adderall, but feels like he could likely benefit from a refill.  Patient told me he currently has 10 to 15 pills at home.  Last refill March 2023. Plan: Continue to take Adderall as needed at patient's discretion. Can discuss refills at next visit.

## 2022-11-30 NOTE — Progress Notes (Signed)
Pt states prescription sent to pharmacy during the clinic visit but they don't have it in stock. Wants it changed to another pharmacy. Called original pharmacy to have it cancelled and placed order to be sent into his requested pharmacy.   Gwenevere Abbot, MD Eligha Bridegroom. [facility], PGY-3

## 2022-11-30 NOTE — Progress Notes (Signed)
Subjective:  CC: Dyspnea, chronic conditions, medication refills  HPI:  [name] is a 57 y.o. person with a past medical history stated below and presents today for dyspnea, follow-up chronic conditions, medication refills.. Please see problem based assessment and plan for additional details.  Past Medical History:  Diagnosis Date   Anxiety    Chronic back pain    GERD (gastroesophageal reflux disease)    Hypertension    prior, was on medication for a few months in the past   Lumbar disc herniation    OCD (obsessive compulsive disorder)    Sciatica     Current Outpatient Medications on File Prior to Visit  Medication Sig Dispense Refill   amphetamine-dextroamphetamine (ADDERALL) 20 MG tablet Take 1 tablet (20 mg total) by mouth in the morning. 90 tablet 0   lamoTRIgine (LAMICTAL) 150 MG tablet Take 1 tablet (150 mg total) by mouth daily. 90 tablet 0   pantoprazole (PROTONIX) 40 MG tablet Take 1 tablet (40 mg total) by mouth daily. 90 tablet 3   umeclidinium-vilanterol (ANORO ELLIPTA) 62.5-25 MCG/INH AEPB Inhale 1 puff into the lungs daily as needed (shortness of breath). 60 each 3   [DISCONTINUED] clonazePAM (KLONOPIN) 1 MG tablet Take 1 mg by mouth 2 (two) times daily as needed.     No current facility-administered medications on file prior to visit.    Review of Systems: Please see assessment and plan for pertinent positives and negatives.  Objective:   Vitals:   11/30/22 0918 11/30/22 0937  BP: (!) 161/79 137/74  Pulse: 79 69  Temp: 98.2 F (36.8 C)   TempSrc: Oral   SpO2: 93%   Weight: 226 lb 14.4 oz (102.9 kg)     Physical Exam: Constitutional: Ill-appearing, in mild distress Cardiovascular: regular rate and rhythm, no m/r/g Pulmonary/Chest: Increased work of breathing on room air.  End expiratory wheezing bilaterally.  Coarse breath sounds and rales most prevalent on the right. Abdominal: soft, non-tender, non-distended Extremities: No edema of  the lower extremities bilaterally Skin: warm and dry Psych: Normal mood and affect   Assessment & Plan:  Dyspnea Patient has a history of right sided pneumonia, empyema requiring chest tube placement in 2021.  He presented today with a 1 week history of dyspnea, productive cough, shortness of breath, foul-smelling sputum, right upper chest pain.  He endorses chills, fatigue, and malaise.  He denies hematemesis, syncope, or presyncope.  On exam he has expiratory wheezing bilaterally, Rales and coarse breath sounds on the right side.  On chest x-ray the reticulonodular interstitial thickening in both lower lobes concerning for atypical infection or smoking-related inflammation.  No large lobar pneumonia seen.  He has previously been shown to have emphysema on CT, though formal COPD diagnosis was never made and PFTs were never conducted.  There appears to have been some concern in the past for an obstructing lesion which may have led to his pneumonia, which does not appear to have been ever fully ruled out.  Given the patient's constellation of findings, differential would include pneumonia, bronchitis, and/or COPD exacerbation.  Will go ahead and treat the patient has a COPD exacerbation/community acquired pneumonia case.  Discussed return precautions with the patient at length such as hematemesis, worsening dyspnea, syncope, presyncope, worsening of current symptoms, or failure to improve in the next couple days with therapy.  He feels that after his first bout of pneumonia and empyema, his symptoms fully resolved.  I am concerned that he does have  what seems to be focal right-sided findings on exam.  He would benefit from formal pulmonology evaluation, likely cross-sectional imaging, and formal workup of his COPD.  Plan: Augmentin 875-125 twice daily 5 days Azithromycin 500 mg day 1,  250 mg daily 2 through 5. Prednisone 40 mg for 5 days DuoNeb nebulizer treatments at home Refilled LAMA LABA,  Ventolin. Pulmonology referral CBC, BMP  Chronic insomnia On clonidine 0.2 mg, Seroquel 300 mg nightly for many years.  Refilled these medications for the patient today.  Chronic prescription opiate use Patient on chronic prescription opioid therapy for his back pain.  He takes MS Contin and gabapentin for many years.  PDMP reviewed and appropriate.  No issues with constipation at this time.  No issues with sedation with his gabapentin. Plan: Refill MS Contin, gabapentin Tox assure panel today.  Attention and concentration deficit Has not been taking Adderall in some months as he has not had insurance.  He feels that it helps with his ADHD and helps him focus at work.  He has tried to wean off of his Adderall, but feels like he could likely benefit from a refill.  Patient told me he currently has 10 to 15 pills at home.  Last refill March 2023. Plan: Continue to take Adderall as needed at patient's discretion. Can discuss refills at next visit.    Patient seen with Dr. Alver Sorrow MD [facility]  PGY-1 Pager: [phone]  Phone: [phone] Date 11/30/2022  Time 1:33 PM

## 2022-12-01 LAB — CBC
Hematocrit: 44.7 % (ref 37.5–51.0)
Hemoglobin: 14.9 g/dL (ref 13.0–17.7)
MCH: 30.6 pg (ref 26.6–33.0)
MCHC: 33.3 g/dL (ref 31.5–35.7)
MCV: 92 fL (ref 79–97)
Platelets: 266 10*3/uL (ref 150–450)
RBC: 4.87 x10E6/uL (ref 4.14–5.80)
RDW: 13 % (ref 11.6–15.4)
WBC: 11.6 10*3/uL — ABNORMAL HIGH (ref 3.4–10.8)

## 2022-12-01 LAB — BMP8+ANION GAP
Anion Gap: 16 mmol/L (ref 10.0–18.0)
BUN/Creatinine Ratio: 11 (ref 9–20)
BUN: 9 mg/dL (ref 6–24)
CO2: 23 mmol/L (ref 20–29)
Calcium: 9 mg/dL (ref 8.7–10.2)
Chloride: 101 mmol/L (ref 96–106)
Creatinine, Ser: 0.82 mg/dL (ref 0.76–1.27)
Glucose: 95 mg/dL (ref 70–99)
Potassium: 5.1 mmol/L (ref 3.5–5.2)
Sodium: 140 mmol/L (ref 134–144)
eGFR: 101 mL/min/{1.73_m2} (ref >59)

## 2022-12-01 NOTE — Progress Notes (Signed)
Internal Medicine Clinic Attending  I was physically present during the key portions of the resident provided service and participated in the medical decision making of patient's management care. I reviewed pertinent patient test results.  The assessment, diagnosis, and plan were formulated together and I agree with the documentation in the resident's note.  Patient presumed COPD, chronic tobacco use, history of CAP in 2021 complicated by empyema requiring chest tube drainage. He is presenting with signs and symptoms concerning for recurrently PNA. Has a productive cough, dyspnea, and focal right middle and lower lobe rales. No fever, saturating 93% on RA.  CXR shows bibasilar infiltrates although no evidence of lobar PNA.   Given his history and underlying lung disease we are treating for CAP with augmentin and azithromycin. Also prendisone for likely COPD exacerbation. If no improvement, low threshold to repeat CXR or pursue CT chest for further evaluation.   Reymundo Poll, MD

## 2022-12-05 ENCOUNTER — Telehealth: Payer: Self-pay

## 2022-12-05 LAB — TOXASSURE SELECT,+ANTIDEPR,UR

## 2022-12-05 NOTE — Telephone Encounter (Signed)
Patients spouse called she stated the doctor has been trying to call him regarding his xray results. The patient is not able to answer the phone until after 5pm due to being at work, per spouse if the doctor is unable to reach him you guys can call her.

## 2022-12-08 NOTE — Telephone Encounter (Signed)
Spoke with patient regarding everything thanks

## 2022-12-25 ENCOUNTER — Other Ambulatory Visit: Payer: Self-pay | Admitting: Student

## 2022-12-25 DIAGNOSIS — F319 Bipolar disorder, unspecified: Secondary | ICD-10-CM

## 2022-12-25 DIAGNOSIS — R41840 Attention and concentration deficit: Secondary | ICD-10-CM

## 2022-12-25 DIAGNOSIS — F5104 Psychophysiologic insomnia: Secondary | ICD-10-CM

## 2022-12-28 ENCOUNTER — Ambulatory Visit (INDEPENDENT_AMBULATORY_CARE_PROVIDER_SITE_OTHER): Payer: BC Managed Care – PPO | Admitting: Student

## 2022-12-28 VITALS — BP 144/72 | HR 68 | Temp 98.8°F | Wt 225.7 lb

## 2022-12-28 DIAGNOSIS — G8929 Other chronic pain: Secondary | ICD-10-CM

## 2022-12-28 DIAGNOSIS — J189 Pneumonia, unspecified organism: Secondary | ICD-10-CM

## 2022-12-28 DIAGNOSIS — Z79891 Long term (current) use of opiate analgesic: Secondary | ICD-10-CM

## 2022-12-28 MED ORDER — MORPHINE SULFATE ER 30 MG PO TBCR
30.0000 mg | EXTENDED_RELEASE_TABLET | Freq: Three times a day (TID) | ORAL | 0 refills | Status: DC
Start: 2022-12-30 — End: 2023-01-29

## 2022-12-28 MED ORDER — GUAIFENESIN ER 600 MG PO TB12: 600.0000 mg | ORAL_TABLET | Freq: Two times a day (BID) | ORAL | 0 refills | Status: DC

## 2022-12-28 NOTE — Patient Instructions (Signed)
Thank you so much for coming to the clinic today!   I have ordered a CT scan to further evaluate your lungs, in the meantime please keep taking those antibiotics, and I have sent in something that will help you cough everything up.   If you have any questions please feel free to the call the clinic at anytime at [phone]. It was a pleasure seeing you!  Best, Dr. Thomasene Ripple

## 2022-12-30 NOTE — Progress Notes (Signed)
CC: Dyspnea  HPI:  [name] is a 57 y.o. male living with a history stated below and presents today for dyspnea. Please see problem based assessment and plan for additional details.  Past Medical History:  Diagnosis Date   Anxiety    Chronic back pain    GERD (gastroesophageal reflux disease)    Hypertension    prior, was on medication for a few months in the past   Lumbar disc herniation    OCD (obsessive compulsive disorder)    Sciatica     Current Outpatient Medications on File Prior to Visit  Medication Sig Dispense Refill   albuterol (VENTOLIN HFA) 108 (90 Base) MCG/ACT inhaler Inhale 2 puffs into the lungs every 6 (six) hours as needed for wheezing or shortness of breath. 18 g 3   amphetamine-dextroamphetamine (ADDERALL) 20 MG tablet Take 1 tablet (20 mg total) by mouth in the morning. 90 tablet 0   cloNIDine (CATAPRES) 0.2 MG tablet Take 1 tablet (0.2 mg total) by mouth at bedtime. Do not drive heavy machinery including cars after taking medication. 90 tablet 0   gabapentin (NEURONTIN) 800 MG tablet TAKE 1 & 1/2 (ONE & ONE-HALF) TABLETS BY MOUTH IN THE MORNING AND 1 TABLET  AT NOON AND 1 & 1/2 (ONE & ONE-HALF) TABLETS AT BEDTIME 120 tablet 0   ipratropium-albuterol (DUONEB) 0.5-2.5 (3) MG/3ML SOLN Take 3 mLs by nebulization every 4 (four) hours as needed. 360 mL 1   lamoTRIgine (LAMICTAL) 150 MG tablet Take 1 tablet (150 mg total) by mouth daily. 90 tablet 0   pantoprazole (PROTONIX) 40 MG tablet Take 1 tablet (40 mg total) by mouth daily. 90 tablet 3   QUEtiapine (SEROQUEL) 300 MG tablet TAKE 1 TABLET BY MOUTH AT BEDTIME. Do not drive heavy machinery including cars after taking medication. 90 tablet 0   umeclidinium-vilanterol (ANORO ELLIPTA) 62.5-25 MCG/ACT AEPB Inhale 1 puff into the lungs daily. 60 each 2   umeclidinium-vilanterol (ANORO ELLIPTA) 62.5-25 MCG/INH AEPB Inhale 1 puff into the lungs daily as needed (shortness of breath). 60 each 3   [DISCONTINUED]  clonazePAM (KLONOPIN) 1 MG tablet Take 1 mg by mouth 2 (two) times daily as needed.     No current facility-administered medications on file prior to visit.    Family History  Family history unknown: Yes    Social History   Socioeconomic History   Marital status: Married    Spouse name: Not on file   Number of children: Not on file   Years of education: Not on file   Highest education level: Not on file  Occupational History   Not on file  Tobacco Use   Smoking status: Every Day    Current packs/day: 1.00    Average packs/day: 1 pack/day for 40.0 years (40.0 ttl pk-yrs)    Types: Cigarettes   Smokeless tobacco: Never   Tobacco comments:    sometimes less  Vaping Use   Vaping status: Never Used  Substance and Sexual Activity   Alcohol use: No   Drug use: No   Sexual activity: Not on file  Other Topics Concern   Not on file  Social History Narrative   Married, works at [employer], Production designer, theatre/television/film.  Works 6 days per week.   [employer].   02/2019   Social Determinants of Health   Financial Resource Strain: Low Risk  (01/05/2022)   Overall Financial Resource Strain (CARDIA)    Difficulty of Paying Living Expenses: Not hard  at all  Food Insecurity: Food Insecurity Present (01/05/2022)   Hunger Vital Sign    Worried About Running Out of Food in the Last Year: Sometimes true    Ran Out of Food in the Last Year: Never true  Transportation Needs: No Transportation Needs (01/05/2022)   PRAPARE - Administrator, Civil Service (Medical): No    Lack of Transportation (Non-Medical): No  Physical Activity: Not on file  Stress: Not on file  Social Connections: Moderately Isolated (01/05/2022)   Social Connection and Isolation Panel [NHANES]    Frequency of Communication with Friends and Family: Once a week    Frequency of Social Gatherings with Friends and Family: More than three times a week    Attends Religious Services: Never    Database administrator or  Organizations: No    Attends Banker Meetings: Never    Marital Status: Married  Catering manager Violence: Not At Risk (01/05/2022)   Humiliation, Afraid, Rape, and Kick questionnaire    Fear of Current or Ex-Partner: No    Emotionally Abused: No    Physically Abused: No    Sexually Abused: No    Review of Systems: ROS negative except for what is noted on the assessment and plan.  Vitals:   12/28/22 1501 12/28/22 1623  BP: (!) 155/76 (!) 144/72  Pulse: 78 68  Temp: 98.8 F (37.1 C)   TempSrc: Oral   SpO2: 95%   Weight: 225 lb 11.2 oz (102.4 kg)     Physical Exam: Constitutional: well-appearing male  in no acute distress Cardiovascular: regular rate and rhythm, no m/r/g Pulmonary/Chest: normal work of breathing on room air, mild expiratory wheezes heard in upper lobes  Assessment & Plan:   Pneumonia Pt presents for follow up for his pneumonia. At his last visit, on 11/30/22, pt presents with symptoms of chest pain, shortness of breath, and intermittent fevers and chills. He went for X-ray stat which showed Reticulonodular interstitial thickening in both lower lobes, concerning for atypical infection or smoking-related inflammation. He was subsequently started on Augmentin and Azithromycin.   Since then, his symptoms initially improved, however on Sunday Morning he started having fevers, chills, and some shortness of breath. He was seen by a PA established with a clinic in his office who started him on Augmentin and benzonotate to help with the cough. He has been taking this, but feels the benzonatate has not been helping him.   In clinic, he is afebrile, and on auscultation has mild expiratory wheezes in the upper lobes. Given recurrent episodes of pneumonia, as well as history of chronic lung disease will further evaluate with CT scan.   Plan:  - CT chest  - Continue Augmentin  - Robitussin sent in for symptom control  - Can consider short course of prednisone  if worsens    Chronic prescription opiate use Pt will run out of MS Contin for his back pain on 11/2, is consistent with fill history and has had no trouble in terms of constipation and feels like his pain is well in control with this. Will refill starting on 11/3.   Patient discussed with Dr. Kae Heller Kaela Beitz, M.D. [facility], PGY-2 Pager: [phone] Date 12/30/2022 Time 11:06 PM

## 2022-12-30 NOTE — Assessment & Plan Note (Addendum)
Pt presents for follow up for his pneumonia. At his last visit, on 11/30/22, pt presents with symptoms of chest pain, shortness of breath, and intermittent fevers and chills. He went for X-ray stat which showed Reticulonodular interstitial thickening in both lower lobes, concerning for atypical infection or smoking-related inflammation. He was subsequently started on Augmentin and Azithromycin.   Since then, his symptoms initially improved, however on Sunday Morning he started having fevers, chills, and some shortness of breath. He was seen by a PA established with a clinic in his office who started him on Augmentin and benzonotate to help with the cough. He has been taking this, but feels the benzonatate has not been helping him.   In clinic, he is afebrile, and on auscultation has mild expiratory wheezes in the upper lobes. Given recurrent episodes of pneumonia, as well as history of chronic lung disease will further evaluate with CT scan.   Plan:  - CT chest  - Continue Augmentin  - Robitussin sent in for symptom control  - Can consider short course of prednisone if worsens

## 2022-12-30 NOTE — Assessment & Plan Note (Signed)
Pt will run out of MS Contin for his back pain on 11/2, is consistent with fill history and has had no trouble in terms of constipation and feels like his pain is well in control with this. Will refill starting on 11/3.

## 2023-01-03 NOTE — Addendum Note (Signed)
Addended by: Burnell Blanks on: 01/03/2023 03:29 PM   Modules accepted: Level of Service

## 2023-01-03 NOTE — Progress Notes (Signed)
Internal Medicine Clinic Attending  Case discussed with the resident at the time of the visit.  We reviewed the resident's history and exam and pertinent patient test results.  I agree with the assessment, diagnosis, and plan of care documented in the resident's note.  

## 2023-01-12 ENCOUNTER — Other Ambulatory Visit: Payer: Self-pay

## 2023-01-12 DIAGNOSIS — F5104 Psychophysiologic insomnia: Secondary | ICD-10-CM

## 2023-01-12 MED ORDER — CLONIDINE HCL 0.2 MG PO TABS: 0.2000 mg | ORAL_TABLET | Freq: Every day | ORAL | 0 refills | Status: DC

## 2023-01-13 ENCOUNTER — Other Ambulatory Visit: Payer: Self-pay | Admitting: Student

## 2023-01-13 DIAGNOSIS — G8929 Other chronic pain: Secondary | ICD-10-CM

## 2023-01-29 ENCOUNTER — Other Ambulatory Visit: Payer: Self-pay | Admitting: *Deleted

## 2023-01-29 ENCOUNTER — Encounter: Payer: BC Managed Care – PPO | Admitting: Student

## 2023-01-29 DIAGNOSIS — G8929 Other chronic pain: Secondary | ICD-10-CM

## 2023-01-29 MED ORDER — MORPHINE SULFATE ER 30 MG PO TBCR
30.0000 mg | EXTENDED_RELEASE_TABLET | Freq: Three times a day (TID) | ORAL | 0 refills | Status: DC
Start: 2023-01-29 — End: 2023-03-08

## 2023-01-29 NOTE — Telephone Encounter (Signed)
Pt stated he's completely out ; requesting MS Contin refill today. Thanks

## 2023-02-09 ENCOUNTER — Other Ambulatory Visit: Payer: Self-pay | Admitting: Student

## 2023-02-09 DIAGNOSIS — G8929 Other chronic pain: Secondary | ICD-10-CM

## 2023-02-11 NOTE — Telephone Encounter (Signed)
Received after-hours page.  Returned call to patient and confirmed DOB.  Patient requests gabapentin refill.  Last fill on 11/16.  PDMP reviewed and appropriate.  Patient expressed frustration due to sending refill request last week.  Discussed refill request was pended on Friday afternoon.  Will go ahead and send refill for gabapentin to [facility] at [address]. Currently takes gabapentin 800 mg 1.5 tablet in morning and bedtime and 1 tablet in afternoon.  Patient verbalizes understanding and all questions addressed. -Gabapentin 800 mg (120 tablets, 0 refill)

## 2023-03-08 ENCOUNTER — Other Ambulatory Visit: Payer: Self-pay

## 2023-03-08 DIAGNOSIS — G8929 Other chronic pain: Secondary | ICD-10-CM

## 2023-03-08 MED ORDER — MORPHINE SULFATE ER 30 MG PO TBCR
30.0000 mg | EXTENDED_RELEASE_TABLET | Freq: Three times a day (TID) | ORAL | 0 refills | Status: DC
Start: 2023-03-08 — End: 2023-04-10

## 2023-03-12 ENCOUNTER — Other Ambulatory Visit: Payer: Self-pay

## 2023-03-12 ENCOUNTER — Other Ambulatory Visit: Payer: Self-pay | Admitting: Student

## 2023-03-12 DIAGNOSIS — G8929 Other chronic pain: Secondary | ICD-10-CM

## 2023-03-22 ENCOUNTER — Ambulatory Visit (HOSPITAL_COMMUNITY)
Admission: RE | Admit: 2023-03-22 | Discharge: 2023-03-22 | Disposition: A | Discharge status: Home / Self Care | Payer: BC Managed Care – PPO | Source: Ambulatory Visit | Attending: Internal Medicine | Admitting: Internal Medicine

## 2023-03-22 ENCOUNTER — Ambulatory Visit: Payer: BC Managed Care – PPO | Admitting: Student

## 2023-03-22 ENCOUNTER — Telehealth: Payer: Self-pay | Admitting: Student

## 2023-03-22 VITALS — BP 164/89 | HR 73 | Temp 98.4°F | Ht 73.0 in | Wt 224.4 lb

## 2023-03-22 DIAGNOSIS — J439 Emphysema, unspecified: Secondary | ICD-10-CM | POA: Insufficient documentation

## 2023-03-22 DIAGNOSIS — J22 Unspecified acute lower respiratory infection: Secondary | ICD-10-CM | POA: Diagnosis not present

## 2023-03-22 DIAGNOSIS — R059 Cough, unspecified: Secondary | ICD-10-CM | POA: Diagnosis not present

## 2023-03-22 DIAGNOSIS — F5104 Psychophysiologic insomnia: Secondary | ICD-10-CM

## 2023-03-22 DIAGNOSIS — I10 Essential (primary) hypertension: Secondary | ICD-10-CM | POA: Insufficient documentation

## 2023-03-22 DIAGNOSIS — B001 Herpesviral vesicular dermatitis: Secondary | ICD-10-CM

## 2023-03-22 DIAGNOSIS — R509 Fever, unspecified: Secondary | ICD-10-CM | POA: Diagnosis not present

## 2023-03-22 DIAGNOSIS — L409 Psoriasis, unspecified: Secondary | ICD-10-CM

## 2023-03-22 DIAGNOSIS — J189 Pneumonia, unspecified organism: Secondary | ICD-10-CM

## 2023-03-22 DIAGNOSIS — F319 Bipolar disorder, unspecified: Secondary | ICD-10-CM

## 2023-03-22 DIAGNOSIS — J449 Chronic obstructive pulmonary disease, unspecified: Secondary | ICD-10-CM | POA: Diagnosis not present

## 2023-03-22 DIAGNOSIS — R41840 Attention and concentration deficit: Secondary | ICD-10-CM

## 2023-03-22 DIAGNOSIS — G8929 Other chronic pain: Secondary | ICD-10-CM

## 2023-03-22 LAB — RESP PANEL BY RT-PCR (RSV, FLU A&B, COVID)  RVPGX2
Influenza A by PCR: POSITIVE — AB
Influenza B by PCR: NEGATIVE
Resp Syncytial Virus by PCR: NEGATIVE
SARS Coronavirus 2 by RT PCR: NEGATIVE

## 2023-03-22 MED ORDER — ALBUTEROL SULFATE HFA 108 (90 BASE) MCG/ACT IN AERS: 2.0000 | INHALATION_SPRAY | Freq: Four times a day (QID) | RESPIRATORY_TRACT | 3 refills | Status: AC | PRN

## 2023-03-22 MED ORDER — GUAIFENESIN ER 600 MG PO TB12: 600.0000 mg | ORAL_TABLET | Freq: Two times a day (BID) | ORAL | 0 refills | Status: DC

## 2023-03-22 MED ORDER — VALACYCLOVIR HCL 1 G PO TABS: ORAL_TABLET | ORAL | 0 refills | Status: AC

## 2023-03-22 MED ORDER — AMLODIPINE BESYLATE 5 MG PO TABS: 5.0000 mg | ORAL_TABLET | Freq: Every day | ORAL | 11 refills | Status: DC

## 2023-03-22 MED ORDER — CLONIDINE HCL 0.2 MG PO TABS
0.2000 mg | ORAL_TABLET | Freq: Every day | ORAL | 0 refills | Status: DC
Start: 2023-03-22 — End: 2023-06-29

## 2023-03-22 MED ORDER — PREDNISONE 20 MG PO TABS: 20.0000 mg | ORAL_TABLET | Freq: Every day | ORAL | 0 refills | Status: AC

## 2023-03-22 MED ORDER — AZITHROMYCIN 250 MG PO TABS: ORAL_TABLET | ORAL | 0 refills | Status: AC

## 2023-03-22 MED ORDER — TRIAMCINOLONE ACETONIDE 0.1 % EX CREA: 1.0000 | TOPICAL_CREAM | Freq: Two times a day (BID) | CUTANEOUS | 0 refills | Status: AC

## 2023-03-22 MED ORDER — QUETIAPINE FUMARATE 300 MG PO TABS: ORAL_TABLET | ORAL | 0 refills | Status: DC

## 2023-03-22 MED ORDER — BENZONATATE 100 MG PO CAPS: 100.0000 mg | ORAL_CAPSULE | Freq: Three times a day (TID) | ORAL | 0 refills | Status: DC | PRN

## 2023-03-22 NOTE — Assessment & Plan Note (Signed)
Patient has history of COPD, taking inhalers regularly.  With this acute illness his cough and wheezing have been worse.  Wheezy on exam and dyspneic.  Also endorsing increased sputum production. Plan: Treatment of COPD flare secondary to viral infection 40 mg prednisone for 5 days 5-day course of azithromycin

## 2023-03-22 NOTE — Assessment & Plan Note (Addendum)
 Patient endorses ongoing deficits with attention and concentration.  He had a prescription for Adderall in the past, but had not been refilling this is mostly due to insurance reasons.  He tried to stop taking this medication, but has had ongoing issues with concentration at work again.  He would be interested in resuming this medication, however this is typically not a medication that we manage here in our clinic.  He would likely benefit from dedicated psychiatric management. Plan: Referral to psychiatry

## 2023-03-22 NOTE — Assessment & Plan Note (Signed)
Patient hypertensive today to 164/89, hypertensive at past visits as well.  Not currently on any blood pressure medications, but does take clonidine for insomnia.  Had been on antihypertensive medications in the past. Plan: Initiate amlodipine 5 mg Dose adjustments at future visits as needed

## 2023-03-22 NOTE — Telephone Encounter (Signed)
RTC to patient is feeling bad.  Has an appointment to come into the Clinics at 1:15 PM.  Patient was advised to make sure and wear a Mask.  Patient stated that he would.

## 2023-03-22 NOTE — Progress Notes (Addendum)
 Subjective:  CC: Acute upper respiratory illness  HPI:  Roberto Blair is a 58 y.o. person with a past medical history stated below and presents today for the stated chief complaint. Please see problem based assessment and plan for additional details.  Past Medical History:  Diagnosis Date   Anxiety    Chronic back pain    GERD (gastroesophageal reflux disease)    Hypertension    prior, was on medication for a few months in the past   Lumbar disc herniation    OCD (obsessive compulsive disorder)    Sciatica     Current Outpatient Medications on File Prior to Visit  Medication Sig Dispense Refill   ipratropium-albuterol (DUONEB) 0.5-2.5 (3) MG/3ML SOLN Take 3 mLs by nebulization every 4 (four) hours as needed. 360 mL 1   pantoprazole (PROTONIX) 40 MG tablet Take 1 tablet (40 mg total) by mouth daily. 90 tablet 3   umeclidinium-vilanterol (ANORO ELLIPTA) 62.5-25 MCG/ACT AEPB Inhale 1 puff into the lungs daily. 60 each 2   umeclidinium-vilanterol (ANORO ELLIPTA) 62.5-25 MCG/INH AEPB Inhale 1 puff into the lungs daily as needed (shortness of breath). 60 each 3   [DISCONTINUED] clonazePAM (KLONOPIN) 1 MG tablet Take 1 mg by mouth 2 (two) times daily as needed.     No current facility-administered medications on file prior to visit.    Review of Systems: Please see assessment and plan for pertinent positives and negatives.  Objective:   Vitals:   03/22/23 1317  BP: (!) 164/89  Pulse: 73  Temp: 98.4 F (36.9 C)  TempSrc: Oral  SpO2: 94%  Weight: 224 lb 6.4 oz (101.8 kg)  Height: 6\' 1"  (1.854 m)    Physical Exam: Constitutional: Ill-appearing Cardiovascular: Regular rate and rhythm Pulmonary/Chest: Diffuse bilateral expiratory wheezing and coarse breath sounds. Abdominal: soft, non-tender, non-distended Extremities: No edema of the lower extremities bilaterally.  White plaques on the elbows, left worse than right.  Photos available in the media tab. Psych:  Pleasant affect Thought process is linear and is goal-directed.     Assessment & Plan:  Hypertension Patient hypertensive today to 164/89, hypertensive at past visits as well.  Not currently on any blood pressure medications, but does take clonidine for insomnia.  Had been on antihypertensive medications in the past. Plan: Initiate amlodipine 5 mg Dose adjustments at future visits as needed   Acute respiratory infection Patient presents with fevers, chills, congestion, green phlegm, wheezing, nausea, and loss of appetite since Saturday.  He states the symptoms have been consistent, and does not note any improvement or worsening symptoms prior.  He has been more wheezy, but otherwise denies chest pain, hemoptysis, hematemesis, vomiting, or hematochezia.  His wife at home has similar symptoms.  Etiology favored to be viral in nature, respiratory virus panel obtained and showed positive flu A.  Of note he does have a history of recurrent pneumonias, past providers have attempted to place orders for CT scan of the chest in order to evaluate for possible lesions or anatomical predisposition to pneumonia.  No recent weight loss, though he is at high risk of cancer due to smoking.  Plan: Symptomatic treatment with Mucinex, Tessalon Perles, and Tylenol Chest x-ray to rule out superimposed pneumonia given ongoing symptoms for 5 days. If positive for consultation will add Augmentin. Reorder chest CT as previously ordered to rule out underlying structural issue leading to recurrent lung infections.   Emphysema of lung (HCC) Patient has history of COPD, taking inhalers regularly.  With this acute illness his cough and wheezing have been worse.  Wheezy on exam and dyspneic.  Also endorsing increased sputum production. Plan: Treatment of COPD flare secondary to viral infection 40 mg prednisone for 5 days 5-day course of azithromycin   Herpes labialis History of cold sores, recurrent outbreak on  the bottom right lip. Plan: 2 g of Valtrex twice daily for 1 day as needed for cold sores   Psoriasis Patient endorsing new rash on the elbows which she noticed about a month ago.  Left worse than right plaques, with some itchiness.  He does have a history of eczema but does not have a history of psoriasis.  The lesions look most consistent with plaque psoriasis.  No pitting of the nailbeds, no rashes elsewhere, no skin changes in the hairline. Plan: Triamcinolone 0.1% cream  follow-up 2 to 3 months   Attention and concentration deficit Patient endorses ongoing deficits with attention and concentration.  He had a prescription for Adderall in the past, but had not been refilling this is mostly due to insurance reasons.  He tried to stop taking this medication, but has had ongoing issues with concentration at work again.  He would be interested in resuming this medication, however this is typically not a medication that we manage here in our clinic.  He would likely benefit from dedicated psychiatric management. Plan: Referral to psychiatry    Patient seen with Dr. Julian Reil MD [facility]  PGY-1 Pager: [phone]  Phone: [phone] Date 05/11/2023  Time 8:48 AM

## 2023-03-22 NOTE — Telephone Encounter (Signed)
Pt calling to report he  has had a Fever between 100-102, Sweats, Coughing , and nausea and real bad ha's sin Satruday 03/17/2023.  Pt has been taking OTC medications and would like to be seen.  Pt is aware we are currently booked.  Pt has not taken a  covid test or any other tests at this time but would like for someone to call him back as soon a  possible.

## 2023-03-22 NOTE — Assessment & Plan Note (Signed)
>>  ASSESSMENT AND PLAN FOR HERPES LABIALIS WRITTEN ON 03/22/2023  9:18 PM BY GABINO BOGA, MD  History of cold sores, recurrent outbreak on the bottom right lip. Plan: 2 g of Valtrex  twice daily for 1 day as needed for cold sores

## 2023-03-22 NOTE — Assessment & Plan Note (Addendum)
Patient endorsing new rash on the elbows which she noticed about a month ago.  Left worse than right plaques, with some itchiness.  He does have a history of eczema but does not have a history of psoriasis.  The lesions look most consistent with plaque psoriasis.  No pitting of the nailbeds, no rashes elsewhere, no skin changes in the hairline. Plan: Triamcinolone 0.1% cream  follow-up 2 to 3 months

## 2023-03-22 NOTE — Assessment & Plan Note (Addendum)
History of cold sores, recurrent outbreak on the bottom right lip. Plan: 2 g of Valtrex twice daily for 1 day as needed for cold sores

## 2023-03-22 NOTE — Telephone Encounter (Signed)
I talked to pt who stated cough started Saturday; worse that night with a h/a. On Sunday, the cough and h/a continued with fever,chills, and nausea. Pt is still feeling bad despite taking OTC meds;Robitussin and Tylenol. Highest fever 102.0; temp yesterday was 101.2. Has not had a covid test. Appt given this afternoon with Dr Rosaura Carpenter @ 1315PM.

## 2023-03-22 NOTE — Assessment & Plan Note (Addendum)
Patient presents with fevers, chills, congestion, green phlegm, wheezing, nausea, and loss of appetite since Saturday.  He states the symptoms have been consistent, and does not note any improvement or worsening symptoms prior.  He has been more wheezy, but otherwise denies chest pain, hemoptysis, hematemesis, vomiting, or hematochezia.  His wife at home has similar symptoms.  Etiology favored to be viral in nature, respiratory virus panel obtained and showed positive flu A.  Of note he does have a history of recurrent pneumonias, past providers have attempted to place orders for CT scan of the chest in order to evaluate for possible lesions or anatomical predisposition to pneumonia.  No recent weight loss, though he is at high risk of cancer due to smoking.  Plan: Symptomatic treatment with Mucinex, Tessalon Perles, and Tylenol Chest x-ray to rule out superimposed pneumonia given ongoing symptoms for 5 days. If positive for consultation will add Augmentin. Reorder chest CT as previously ordered to rule out underlying structural issue leading to recurrent lung infections.

## 2023-03-22 NOTE — Patient Instructions (Signed)
Thank you, [name] for allowing [place] to provide your care today.  I have ordered the following tests for you:  Lab Orders         COVID-19, Flu A+B and RSV     Chest Xray  Referrals ordered today:   Referral Orders         Ambulatory referral to Psychiatry       I have ordered the following medication/changed the following medications:   Start the following medications: Meds ordered this encounter  Medications   triamcinolone cream (KENALOG) 0.1 %    Sig: Apply 1 Application topically 2 (two) times daily. Apply to elbows as needed for rash    Dispense:  30 g    Refill:  0   valACYclovir (VALTREX) 1000 MG tablet    Sig: Take 2 grams, twice for one day in case of cold sores.    Dispense:  60 tablet    Refill:  0   albuterol (VENTOLIN HFA) 108 (90 Base) MCG/ACT inhaler    Sig: Inhale 2 puffs into the lungs every 6 (six) hours as needed for wheezing or shortness of breath.    Dispense:  18 g    Refill:  3   cloNIDine (CATAPRES) 0.2 MG tablet    Sig: Take 1 tablet (0.2 mg total) by mouth at bedtime. Do not drive heavy machinery including cars after taking medication.    Dispense:  90 tablet    Refill:  0   QUEtiapine (SEROQUEL) 300 MG tablet    Sig: TAKE 1 TABLET BY MOUTH AT BEDTIME. Do not drive heavy machinery including cars after taking medication.    Dispense:  90 tablet    Refill:  0   predniSONE (DELTASONE) 20 MG tablet    Sig: Take 1 tablet (20 mg total) by mouth daily with breakfast for 5 days.    Dispense:  5 tablet    Refill:  0   azithromycin (ZITHROMAX Z-PAK) 250 MG tablet    Sig: Take 2 tablets (500 mg) on  Day 1,  followed by 1 tablet (250 mg) once daily on Days 2 through 5.    Dispense:  6 each    Refill:  0   amLODipine (NORVASC) 5 MG tablet    Sig: Take 1 tablet (5 mg total) by mouth daily.    Dispense:  30 tablet    Refill:  11   guaiFENesin (MUCINEX) 600 MG 12 hr tablet    Sig: Take 1 tablet (600 mg total) by mouth 2 (two) times daily.     Dispense:  60 tablet    Refill:  0       Follow up: 2-3 months for routine follow up   We look forward to seeing you next time. Please call our clinic at [phone] if you have any questions or concerns. The best time to call is Monday-Friday from 9am-4pm, but there is someone available 24/7. If after hours or the weekend, call the main hospital number and ask for the Internal Medicine Resident On-Call. If you need medication refills, please notify your pharmacy one week in advance and they will send [place] a request.   Thank you for trusting me with your care. Wishing you the best!  Lovie Macadamia MD [facility]

## 2023-03-23 ENCOUNTER — Other Ambulatory Visit: Payer: Self-pay | Admitting: Student

## 2023-03-23 MED ORDER — ONDANSETRON HCL 4 MG PO TABS: 4.0000 mg | ORAL_TABLET | Freq: Every day | ORAL | 1 refills | Status: DC | PRN

## 2023-03-23 MED ORDER — HYDROCOD POLI-CHLORPHE POLI ER 10-8 MG/5ML PO SUER: 5.0000 mL | Freq: Two times a day (BID) | ORAL | 0 refills | Status: DC | PRN

## 2023-03-23 NOTE — Progress Notes (Signed)
Internal Medicine Clinic Attending  Case discussed with the resident at the time of the visit.  We reviewed the resident's history and exam and pertinent patient test results.  I agree with the assessment, diagnosis, and plan of care documented in the resident's note.

## 2023-03-26 ENCOUNTER — Telehealth: Payer: Self-pay | Admitting: *Deleted

## 2023-03-26 ENCOUNTER — Encounter: Payer: BC Managed Care – PPO | Admitting: Student

## 2023-03-26 MED ORDER — ONDANSETRON 4 MG PO TBDP: 4.0000 mg | ORAL_TABLET | Freq: Three times a day (TID) | ORAL | 0 refills | Status: AC | PRN

## 2023-03-26 NOTE — Telephone Encounter (Addendum)
I was able to speak with this patient on the phone today.  The patient states he is not doing much better with his illness since his recent visit.  He is particularly concerned about his appetite and nausea, stating that the Zofran that he was given did not help much.  He states that the last time he ate was yesterday, and even then he was barely able to eat.  He continues to have exertional dyspnea, states he is able to walk about 10 feet before having shortness of breath.  He does state that his cough is little bit better with the Tussionex medication which was called in.  On the phone the patient was able to speak in full sentences, no wheezing or cough while speaking to me but does sound ill.  Unable to check oxygen saturations at home.  I discussed with the patient that his chest x-ray was negative when we saw him, but this does not necessarily exclude developing pneumonia. He is being treated for COPD exacerbation with Azithromycin and Prednisone, but I discussed that if his COPD is severe enough, this may not cover. He is nearing the tail end of his steroid and abx regiment.   Patient requesting sublingual Zofran, will call in a prescription for this medication.  I informed the patient that if he is not feeling better, has no appetite, and continues to have relatively severe exertional dyspnea it would likely be worthwhile to get evaluated at urgent care or the emergency department where they will be able check his oxygen saturations, reimage him as needed, obtain labs.  Patient stated he will try the Zofran and continue the current medications.  If he does not get better he got to [facility] or ED.

## 2023-03-26 NOTE — Addendum Note (Signed)
Addended by: Lovie Macadamia on: 03/26/2023 05:33 PM   Modules accepted: Orders

## 2023-03-26 NOTE — Telephone Encounter (Signed)
Call from patient states saw Dr. Francee Piccolo last  week and was told to call back if he continued to feel bad.  Patient stated not feeling any better wants to know what he should do next.

## 2023-04-02 ENCOUNTER — Other Ambulatory Visit: Payer: Self-pay | Admitting: Student

## 2023-04-10 ENCOUNTER — Other Ambulatory Visit: Payer: Self-pay | Admitting: Student

## 2023-04-10 ENCOUNTER — Other Ambulatory Visit: Payer: Self-pay

## 2023-04-10 DIAGNOSIS — G8929 Other chronic pain: Secondary | ICD-10-CM

## 2023-04-10 MED ORDER — MORPHINE SULFATE ER 30 MG PO TBCR
30.0000 mg | EXTENDED_RELEASE_TABLET | Freq: Three times a day (TID) | ORAL | 0 refills | Status: DC
Start: 2023-04-11 — End: 2023-05-08

## 2023-05-07 ENCOUNTER — Other Ambulatory Visit: Payer: Self-pay | Admitting: Student

## 2023-05-07 DIAGNOSIS — G8929 Other chronic pain: Secondary | ICD-10-CM

## 2023-05-08 ENCOUNTER — Other Ambulatory Visit: Payer: Self-pay | Admitting: Student

## 2023-05-08 DIAGNOSIS — G8929 Other chronic pain: Secondary | ICD-10-CM

## 2023-05-08 MED ORDER — MORPHINE SULFATE ER 30 MG PO TBCR: 30.0000 mg | EXTENDED_RELEASE_TABLET | Freq: Three times a day (TID) | ORAL | 0 refills | Status: DC

## 2023-05-08 NOTE — Telephone Encounter (Signed)
 Copied from CRM [id number]. Topic: Clinical - Medication Refill >> May 08, 2023  9:02 AM Irine Seal wrote: Most Recent Primary Care Visit:  Provider: Lovie Macadamia  Department: [facility]  Visit Type: OPEN ESTABLISHED  Date: 03/22/2023  Medication: morphine (MS CONTIN) 30 MG 12 hr tablet  Has the patient contacted their pharmacy? No (Agent: If no, request that the patient contact the pharmacy for the refill. If patient does not wish to contact the pharmacy document the reason why and proceed with request.) (Agent: If yes, when and what did the pharmacy advise?)  Pharmacy will not submit the refill request due to controlled substance regulations.   Is this the correct pharmacy for this prescription? No If no, delete pharmacy and type the correct one.  This is the patient's preferred pharmacy:  [facility] [address] [place], [place] [address] [phone]  Has the prescription been filled recently? No  Is the patient out of the medication? Yes  Has the patient been seen for an appointment in the last year OR does the patient have an upcoming appointment? Yes  Can we respond through MyChart? Yes  Agent: Please be advised that Rx refills may take up to 3 business days. We ask that you follow-up with your pharmacy.

## 2023-05-11 ENCOUNTER — Ambulatory Visit: Admitting: Student

## 2023-05-11 ENCOUNTER — Other Ambulatory Visit: Payer: Self-pay | Admitting: Student

## 2023-05-11 ENCOUNTER — Telehealth: Payer: Self-pay

## 2023-05-11 VITALS — BP 142/69 | HR 67 | Temp 98.0°F | Ht 73.0 in | Wt 222.1 lb

## 2023-05-11 DIAGNOSIS — R7989 Other specified abnormal findings of blood chemistry: Secondary | ICD-10-CM

## 2023-05-11 DIAGNOSIS — Z79899 Other long term (current) drug therapy: Secondary | ICD-10-CM | POA: Diagnosis not present

## 2023-05-11 DIAGNOSIS — R41840 Attention and concentration deficit: Secondary | ICD-10-CM

## 2023-05-11 DIAGNOSIS — L409 Psoriasis, unspecified: Secondary | ICD-10-CM

## 2023-05-11 DIAGNOSIS — Z79891 Long term (current) use of opiate analgesic: Secondary | ICD-10-CM | POA: Diagnosis not present

## 2023-05-11 DIAGNOSIS — G8929 Other chronic pain: Secondary | ICD-10-CM

## 2023-05-11 DIAGNOSIS — I10 Essential (primary) hypertension: Secondary | ICD-10-CM

## 2023-05-11 DIAGNOSIS — R5383 Other fatigue: Secondary | ICD-10-CM | POA: Diagnosis not present

## 2023-05-11 DIAGNOSIS — F17210 Nicotine dependence, cigarettes, uncomplicated: Secondary | ICD-10-CM

## 2023-05-11 DIAGNOSIS — J439 Emphysema, unspecified: Secondary | ICD-10-CM | POA: Diagnosis not present

## 2023-05-11 DIAGNOSIS — Z720 Tobacco use: Secondary | ICD-10-CM

## 2023-05-11 MED ORDER — ANORO ELLIPTA 62.5-25 MCG/ACT IN AEPB: 1.0000 | INHALATION_SPRAY | Freq: Every day | RESPIRATORY_TRACT | 2 refills | Status: AC

## 2023-05-11 MED ORDER — MORPHINE SULFATE ER 30 MG PO TBCR: 30.0000 mg | EXTENDED_RELEASE_TABLET | Freq: Three times a day (TID) | ORAL | 0 refills | Status: DC

## 2023-05-11 MED ORDER — CLOBETASOL PROPIONATE 0.05 % EX CREA: 1.0000 | TOPICAL_CREAM | Freq: Two times a day (BID) | CUTANEOUS | 3 refills | Status: AC

## 2023-05-11 MED ORDER — AMLODIPINE BESYLATE 5 MG PO TABS: 5.0000 mg | ORAL_TABLET | Freq: Every day | ORAL | 3 refills | Status: DC

## 2023-05-11 MED ORDER — FLUTICASONE PROPIONATE 50 MCG/ACT NA SUSP: 1.0000 | Freq: Every day | NASAL | 3 refills | Status: AC

## 2023-05-11 MED ORDER — CETIRIZINE HCL 10 MG PO TABS: 10.0000 mg | ORAL_TABLET | Freq: Every day | ORAL | 0 refills | Status: AC

## 2023-05-11 NOTE — Assessment & Plan Note (Signed)
 Persistent, but states he has cut down on his cigarette usage.  Discussed that tobacco cessation will be one of the most important things to improving his respiratory status.  He will continue to work on cutting down tobacco usage, can discuss pharmacological options for smoking cessation at subsequent visits.

## 2023-05-11 NOTE — Progress Notes (Signed)
 Pt had refill of MS Contin sent to [facility] pharmacy on [address], however they have run out. I will send in a 30 day supply to the [facility] at [place]. I have sent his refill to [facility], and will cancel the previous prescription sent today. Confirmed with pharmacy.   Jason Fila Patsy Zaragoza  PGY-2, Internal Medicine Resident

## 2023-05-11 NOTE — Assessment & Plan Note (Signed)
 Self-reported history of low testosterone, which was replaced in the past.  He endorses ongoing fatigue and states this is similar to how he felt when his testosterone was low.  He states he has no sex drive as well.  We discussed with him that he has multiple reasons to have fatigue such as COPD, recent viral illness, multiple sedating medications including clonidine, gabapentin, MS Contin, and Seroquel.  We also discussed the fact that chronic opiate use decreases testosterone.  We discussed that testosterone replacement therapy is not a service we offer in our clinic and should his testosterone be low we will most likely recommendation would tapering his MS Contin and continue to manage his chronic conditions as well as possible endocrine consult.  Patient was still interested in getting his testosterone levels checked despite the fact that it would not alter management.

## 2023-05-11 NOTE — Assessment & Plan Note (Addendum)
 Patient hypertensive to 142/69 today.  He was prescribed amlodipine 5 mg last office visit, and took this until it ran out.  He did not refill this medication. Plan: Refill amlodipine 5

## 2023-05-11 NOTE — Assessment & Plan Note (Signed)
 On MS Contin for chronic knee and back pain.  Most recently filled 05/08/2023.  We will go ahead and update his tox assure while he is here.

## 2023-05-11 NOTE — Telephone Encounter (Signed)
 Roberto Blair (Key: [id number]) PA Case ID #: [id number] Need Help? Call us at [phone] Outcome Approved today by [facility] 2017 Approved. Effective Date: 05/11/2023 Authorization Expiration Date: 11/07/2023 Drug Morphine Sulfate ER 30MG  er tablets ePA cloud logo Form [employer] [employer] Electronic Request Form

## 2023-05-11 NOTE — Progress Notes (Signed)
 Subjective:  CC: Follow-up on chronic conditions  HPI:  [name] is a 58 y.o. person with a past medical history stated below and presents today for the stated chief complaint. Please see problem based assessment and plan for additional details.  Past Medical History:  Diagnosis Date   Anxiety    Chronic back pain    GERD (gastroesophageal reflux disease)    Hypertension    prior, was on medication for a few months in the past   Lumbar disc herniation    OCD (obsessive compulsive disorder)    Sciatica     Current Outpatient Medications on File Prior to Visit  Medication Sig Dispense Refill   albuterol (VENTOLIN HFA) 108 (90 Base) MCG/ACT inhaler Inhale 2 puffs into the lungs every 6 (six) hours as needed for wheezing or shortness of breath. 18 g 3   benzonatate (TESSALON PERLES) 100 MG capsule Take 1 capsule (100 mg total) by mouth 3 (three) times daily as needed for cough. 20 capsule 0   chlorpheniramine-HYDROcodone (TUSSIONEX) 10-8 MG/5ML Take 5 mLs by mouth every 12 (twelve) hours as needed for cough. 115 mL 0   cloNIDine (CATAPRES) 0.2 MG tablet Take 1 tablet (0.2 mg total) by mouth at bedtime. Do not drive heavy machinery including cars after taking medication. 90 tablet 0   gabapentin (NEURONTIN) 800 MG tablet TAKE 1 AND 1/2 TABLETS BY MOUTH IN THE MORNING, 1 AT NOON AND 1 AND 1/2 AT BEDTIME 120 tablet 0   guaiFENesin (MUCINEX) 600 MG 12 hr tablet Take 1 tablet (600 mg total) by mouth 2 (two) times daily. 60 tablet 0   ipratropium-albuterol (DUONEB) 0.5-2.5 (3) MG/3ML SOLN Take 3 mLs by nebulization every 4 (four) hours as needed. 360 mL 1   morphine (MS CONTIN) 30 MG 12 hr tablet Take 1 tablet (30 mg total) by mouth 3 (three) times daily. 90 tablet 0   ondansetron (ZOFRAN-ODT) 4 MG disintegrating tablet Take 1 tablet (4 mg total) by mouth every 8 (eight) hours as needed for nausea or vomiting. 20 tablet 0   pantoprazole (PROTONIX) 40 MG tablet Take 1 tablet (40 mg  total) by mouth daily. 90 tablet 3   QUEtiapine (SEROQUEL) 300 MG tablet TAKE 1 TABLET BY MOUTH AT BEDTIME. Do not drive heavy machinery including cars after taking medication. 90 tablet 0   triamcinolone cream (KENALOG) 0.1 % Apply 1 Application topically 2 (two) times daily. Apply to elbows as needed for rash 30 g 0   valACYclovir (VALTREX) 1000 MG tablet Take 2 grams, twice for one day in case of cold sores. 60 tablet 0   [DISCONTINUED] clonazePAM (KLONOPIN) 1 MG tablet Take 1 mg by mouth 2 (two) times daily as needed.     No current facility-administered medications on file prior to visit.    Review of Systems: Please see assessment and plan for pertinent positives and negatives.  Objective:   Vitals:   05/11/23 0947 05/11/23 1037  BP: (!) 160/78 (!) 142/69  Pulse: 74 67  Temp: 98 F (36.7 C)   TempSrc: Oral   SpO2: 94%   Weight: 222 lb 1.6 oz (100.7 kg)   Height: 6\' 1"  (1.854 m)     Physical Exam: Constitutional: Well-appearing Cardiovascular: Regular rate and rhythm Pulmonary/Chest: lungs clear to auscultation bilaterally Extremities: Trace edema of the lower extremities bilaterally Psych: Pleasant affect Thought process is linear and is goal-directed.     Assessment & Plan:  Hypertension Patient hypertensive to  142/69 today.  He was prescribed amlodipine 5 mg last office visit, and took this until it ran out.  He did not refill this medication. Plan: Refill amlodipine 5   Emphysema of lung (HCC) Persistent cough since his recent viral illness.  He says is productive of yellow sputum.  Denies chest pain, shortness of breath, wheezing.  Is using his Ventolin inhaler only, has not been using his Anoro inhaler.  As previously documented, history of recurrent pneumonias.  We are attempting to obtain CT scan. Some concern for underlying chronic bronchitis/bronchiectasis given his overall clinical picture. Plan: Discussed with him inhaler regiment, taking Anoro  daily. Discussed with him use of IS and pulmonary toileting in order to help clear out lungs. Checked in with referral manager regarding CT scan authorization, someone should call him today to try to set that up. Flonase and Zyrtec for allergy management    Psoriasis Persistent lesions consistent with plaque psoriasis of the elbows.  He says topical ketorolac did not help much. Plan: Trial of clobetasol cream Follow-up 2 months   Attention and concentration deficit Patient endorses ongoing deficits with attention and concentration.  He had a prescription for Adderall in the past which was lapsed. He has had ongoing issues with concentration at work again.  He would be interested in resuming this medication, however this is typically not a medication that we manage here in our clinic.  He would likely benefit from dedicated psychiatric management. Plan: Checked in with referral management regarding their prior referral to psychiatry, will continue to follow   Chronic prescription opiate use On MS Contin for chronic knee and back pain.  Most recently filled 05/08/2023.  We will go ahead and update his tox assure while he is here.  Tobacco use Persistent, but states he has cut down on his cigarette usage.  Discussed that tobacco cessation will be one of the most important things to improving his respiratory status.  He will continue to work on cutting down tobacco usage, can discuss pharmacological options for smoking cessation at subsequent visits.  Low testosterone Self-reported history of low testosterone, which was replaced in the past.  He endorses ongoing fatigue and states this is similar to how he felt when his testosterone was low.  He states he has no sex drive as well.  We discussed with him that he has multiple reasons to have fatigue such as COPD, recent viral illness, multiple sedating medications including clonidine, gabapentin, MS Contin, and Seroquel.  We also discussed the  fact that chronic opiate use decreases testosterone.  We discussed that testosterone replacement therapy is not a service we offer in our clinic and should his testosterone be low we will most likely recommendation would tapering his MS Contin and continue to manage his chronic conditions as well as possible endocrine consult.  Patient was still interested in getting his testosterone levels checked despite the fact that it would not alter management.    Patient discussed with Dr. Willow Ora MD [facility]  PGY-1 Pager: [phone]  Phone: [phone] Date 05/11/2023  Time 2:10 PM

## 2023-05-11 NOTE — Assessment & Plan Note (Signed)
 Patient endorses ongoing deficits with attention and concentration.  He had a prescription for Adderall in the past which was lapsed. He has had ongoing issues with concentration at work again.  He would be interested in resuming this medication, however this is typically not a medication that we manage here in our clinic.  He would likely benefit from dedicated psychiatric management. Plan: Checked in with referral management regarding their prior referral to psychiatry, will continue to follow

## 2023-05-11 NOTE — Assessment & Plan Note (Addendum)
 Persistent cough since his recent viral illness.  He says is productive of yellow sputum.  Denies chest pain, shortness of breath, wheezing.  Is using his Ventolin inhaler only, has not been using his Anoro inhaler.  As previously documented, history of recurrent pneumonias.  We are attempting to obtain CT scan. Some concern for underlying chronic bronchitis/bronchiectasis given his overall clinical picture. Plan: Discussed with him inhaler regiment, taking Anoro daily. Discussed with him use of IS and pulmonary toileting in order to help clear out lungs. Checked in with referral manager regarding CT scan authorization, someone should call him today to try to set that up. Flonase and Zyrtec for allergy management

## 2023-05-11 NOTE — Assessment & Plan Note (Signed)
 Persistent lesions consistent with plaque psoriasis of the elbows.  He says topical ketorolac did not help much. Plan: Trial of clobetasol cream Follow-up 2 months

## 2023-05-11 NOTE — Patient Instructions (Signed)
 Thank you, [name] for allowing [place] to provide your care today.  I have ordered the following tests for you:   Lab Orders         Testosterone, Total         ToxAssure Select,+Antidepr,UR       I have ordered the following medication/changed the following medications:    Start the following medications: Meds ordered this encounter  Medications   umeclidinium-vilanterol (ANORO ELLIPTA) 62.5-25 MCG/ACT AEPB    Sig: Inhale 1 puff into the lungs daily.    Dispense:  60 each    Refill:  2   fluticasone (FLONASE) 50 MCG/ACT nasal spray    Sig: Place 1 spray into both nostrils daily.    Dispense:  16 g    Refill:  3   cetirizine (ZYRTEC ALLERGY) 10 MG tablet    Sig: Take 1 tablet (10 mg total) by mouth daily.    Dispense:  90 tablet    Refill:  0   clobetasol cream (TEMOVATE) 0.05 %    Sig: Apply 1 Application topically 2 (two) times daily.    Dispense:  60 g    Refill:  3   amLODipine (NORVASC) 5 MG tablet    Sig: Take 1 tablet (5 mg total) by mouth daily.    Dispense:  90 tablet    Refill:  3     Follow up: 2 months for routine follow up  Please remember: You should receive a call to schedule your CT scan today. You should also receive a call to establish with psychiatry in the near future.    We look forward to seeing you next time. Please call our clinic at [phone] if you have any questions or concerns. The best time to call is Monday-Friday from 9am-4pm, but there is someone available 24/7. If after hours or the weekend, call the main hospital number and ask for the Internal Medicine Resident On-Call. If you need medication refills, please notify your pharmacy one week in advance and they will send [place] a request.   Thank you for trusting me with your care. Wishing you the best!  Lovie Macadamia MD [facility]

## 2023-05-11 NOTE — Telephone Encounter (Signed)
 Prior Authorization for patient (Morphine Sulfate ER 30MG  er tablets) came through on cover my meds was submitted with last office notes awaiting approval or denial.  WUJ:[id number]

## 2023-05-13 LAB — TESTOSTERONE: Testosterone: 117 ng/dL — ABNORMAL LOW (ref 264–916)

## 2023-05-15 ENCOUNTER — Encounter: Payer: Self-pay | Admitting: Student

## 2023-05-16 LAB — TOXASSURE SELECT,+ANTIDEPR,UR

## 2023-05-21 NOTE — Progress Notes (Signed)
 Internal Medicine Clinic Attending  Case discussed with the resident at the time of the visit.  We reviewed the resident's history and exam and pertinent patient test results.  I agree with the assessment, diagnosis, and plan of care documented in the resident's note. His low testosterone could be treated along with tapering of his opioids as the suspected underlying cause.

## 2023-06-03 ENCOUNTER — Other Ambulatory Visit: Payer: Self-pay | Admitting: Student

## 2023-06-03 DIAGNOSIS — G8929 Other chronic pain: Secondary | ICD-10-CM

## 2023-06-07 ENCOUNTER — Other Ambulatory Visit: Payer: Self-pay | Admitting: Student

## 2023-06-07 DIAGNOSIS — G8929 Other chronic pain: Secondary | ICD-10-CM

## 2023-06-07 NOTE — Telephone Encounter (Signed)
 Gabapentin sent today to wrong pharmacy.

## 2023-06-07 NOTE — Telephone Encounter (Unsigned)
 Copied from CRM [id number]. Topic: Clinical - Medication Refill >> Jun 07, 2023  3:56 PM Philippa Chester F wrote: Most Recent Primary Care Visit:  Provider: Lovie Macadamia  Department: [facility]  Visit Type: OPEN ESTABLISHED  Date: 05/11/2023  Medication:   1. morphine (MS CONTIN) 30 MG 12 hr tablet  2. gabapentin (NEURONTIN) 800 MG tablet  Has the patient contacted their pharmacy? Yes   Is this the correct pharmacy for this prescription? Yes  This is the patient's preferred pharmacy:  [facility][id number] [place], [place] - [address] AT [facility] OF [facility] & [address] [place] [address] Phone: [phone] Fax: [phone]     Has the prescription been filled recently? No  Is the patient out of the medication? Yes  Has the patient been seen for an appointment in the last year OR does the patient have an upcoming appointment? Yes  Can we respond through MyChart? Yes  Agent: Please be advised that Rx refills may take up to 3 business days. We ask that you follow-up with your pharmacy.

## 2023-06-08 MED ORDER — MORPHINE SULFATE ER 30 MG PO TBCR
30.0000 mg | EXTENDED_RELEASE_TABLET | Freq: Three times a day (TID) | ORAL | 0 refills | Status: DC
Start: 2023-06-10 — End: 2023-07-10

## 2023-06-20 ENCOUNTER — Other Ambulatory Visit: Payer: Self-pay | Admitting: Student

## 2023-06-20 DIAGNOSIS — R41840 Attention and concentration deficit: Secondary | ICD-10-CM

## 2023-06-20 DIAGNOSIS — M5442 Lumbago with sciatica, left side: Secondary | ICD-10-CM

## 2023-06-20 DIAGNOSIS — G8929 Other chronic pain: Secondary | ICD-10-CM

## 2023-06-20 DIAGNOSIS — F319 Bipolar disorder, unspecified: Secondary | ICD-10-CM

## 2023-06-20 NOTE — Telephone Encounter (Signed)
 Copied from CRM [id number]. Topic: Clinical - Medication Refill >> Jun 20, 2023  2:43 PM Wynona Hedger wrote: Most Recent Primary Care Visit:  Provider: Sheree Dieter  Department: [facility]  Visit Type: OPEN ESTABLISHED  Date: 05/11/2023  Medication:   Has the patient contacted their pharmacy? Yes (Agent: If no, request that the patient contact the pharmacy for the refill. If patient does not wish to contact the pharmacy document the reason why and proceed with request.) (Agent: If yes, when and what did the pharmacy advise?)  Is this the correct pharmacy for this prescription? Yes If no, delete pharmacy and type the correct one.  This is the patient's preferred pharmacy:  [facility][id number] [place], [place] - [address] AT [facility] OF [facility] & [address] [place] [address] Phone: [phone] Fax: [phone]    Has the prescription been filled recently? No  Is the patient out of the medication? Yes  Has the patient been seen for an appointment in the last year OR does the patient have an upcoming appointment? Yes  Can we respond through MyChart? Yes  Agent: Please be advised that Rx refills may take up to 3 business days. We ask that you follow-up with your pharmacy.

## 2023-06-29 ENCOUNTER — Other Ambulatory Visit: Payer: Self-pay | Admitting: Student

## 2023-06-29 ENCOUNTER — Telehealth: Payer: Self-pay | Admitting: Student

## 2023-06-29 DIAGNOSIS — F5104 Psychophysiologic insomnia: Secondary | ICD-10-CM

## 2023-06-29 NOTE — Telephone Encounter (Signed)
 This RN made courtesy call to spouse (on DPR) to advise that requested medication had been filled, per MAR. Spouse verbalized understanding.   Copied from CRM [id number]. Topic: Clinical - Prescription Issue >> Jun 29, 2023 10:57 AM Tisa Forester wrote: Reason for CRM: Patient spouse Homero Stanbro called on 06/29/23 checking on status of the medication refill for the  cloNIDine  (CATAPRES ) 0.2 MG tablet Agent let know it can take up to 3 business days and to follow up with pharmacy

## 2023-07-10 ENCOUNTER — Other Ambulatory Visit: Payer: Self-pay

## 2023-07-10 ENCOUNTER — Other Ambulatory Visit: Payer: Self-pay | Admitting: Student

## 2023-07-10 DIAGNOSIS — M5441 Lumbago with sciatica, right side: Secondary | ICD-10-CM

## 2023-07-10 MED ORDER — GABAPENTIN 800 MG PO TABS: ORAL_TABLET | ORAL | 0 refills | Status: DC

## 2023-07-10 MED ORDER — MORPHINE SULFATE ER 30 MG PO TBCR: 30.0000 mg | EXTENDED_RELEASE_TABLET | Freq: Three times a day (TID) | ORAL | 0 refills | Status: DC

## 2023-08-06 ENCOUNTER — Other Ambulatory Visit: Payer: Self-pay | Admitting: Student

## 2023-08-06 DIAGNOSIS — G8929 Other chronic pain: Secondary | ICD-10-CM

## 2023-08-07 ENCOUNTER — Other Ambulatory Visit: Payer: Self-pay | Admitting: Student

## 2023-08-07 DIAGNOSIS — M5441 Lumbago with sciatica, right side: Secondary | ICD-10-CM

## 2023-08-07 MED ORDER — MORPHINE SULFATE ER 30 MG PO TBCR: 30.0000 mg | EXTENDED_RELEASE_TABLET | Freq: Three times a day (TID) | ORAL | 0 refills | Status: DC

## 2023-08-07 NOTE — Telephone Encounter (Signed)
 Copied from CRM [id number]. Topic: Clinical - Medication Refill >> Aug 07, 2023  1:25 PM Adrianna P wrote: Medication: morphine  (MS CONTIN ) 30 MG 12 hr tablet   Has the patient contacted their pharmacy? Yes (Agent: If no, request that the patient contact the pharmacy for the refill. If patient does not wish to contact the pharmacy document the reason why and proceed with request.) (Agent: If yes, when and what did the pharmacy advise?)  This is the patient's preferred pharmacy:  [facility] #[id number] [place], [place] - [address] AT [facility] OF [facility] & [address] [place] [address] Phone: [phone] Fax: [phone]  [id number] - [place], [place] - [address] AT [facility] OF [address] [facility] [address] & [address] [place] [address] Phone: [phone] Fax: [phone]  Is this the correct pharmacy for this prescription? Yes If no, delete pharmacy and type the correct one.   Has the prescription been filled recently? No  Is the patient out of the medication? Yes  Has the patient been seen for an appointment in the last year OR does the patient have an upcoming appointment? Yes  Can we respond through MyChart? Yes  Agent: Please be advised that Rx refills may take up to 3 business days. We ask that you follow-up with your pharmacy.

## 2023-08-07 NOTE — Telephone Encounter (Signed)
 Last rx written - 07/10/23. Last OV - 05/11/23. TOX - 05/11/23.

## 2023-08-09 ENCOUNTER — Other Ambulatory Visit: Payer: Self-pay | Admitting: *Deleted

## 2023-08-09 DIAGNOSIS — M5441 Lumbago with sciatica, right side: Secondary | ICD-10-CM

## 2023-08-09 NOTE — Telephone Encounter (Signed)
 Patient's wife called needs prescription for the Morphine  [facility] [address] [facility] and does not know when it will be in. Is out per his wife.

## 2023-08-10 MED ORDER — MORPHINE SULFATE ER 30 MG PO TBCR: 30.0000 mg | EXTENDED_RELEASE_TABLET | Freq: Three times a day (TID) | ORAL | 0 refills | Status: DC

## 2023-08-10 NOTE — Telephone Encounter (Signed)
 Patients spouse called regarding a rx for Morphine , per spouse [facility] [address] [facility] available. Patients spouse stated that the pharmacy told her the rx will need to be sent to [facility] on [address]. Patient is requesting the rx to be sent in today, he is completely out of the medication. Pharmacy has been attached to the note.  [id number] - , [place] - [address] AT [facility] OF [address] & [address]

## 2023-09-03 ENCOUNTER — Other Ambulatory Visit: Payer: Self-pay | Admitting: Student

## 2023-09-03 DIAGNOSIS — G8929 Other chronic pain: Secondary | ICD-10-CM

## 2023-09-06 ENCOUNTER — Other Ambulatory Visit: Payer: Self-pay | Admitting: Student

## 2023-09-06 DIAGNOSIS — M5442 Lumbago with sciatica, left side: Secondary | ICD-10-CM

## 2023-09-08 ENCOUNTER — Telehealth: Payer: Self-pay | Admitting: Student

## 2023-09-08 DIAGNOSIS — G8929 Other chronic pain: Secondary | ICD-10-CM

## 2023-09-08 MED ORDER — MORPHINE SULFATE ER 30 MG PO TBCR: 30.0000 mg | EXTENDED_RELEASE_TABLET | Freq: Three times a day (TID) | ORAL | 0 refills | Status: DC

## 2023-09-08 NOTE — Telephone Encounter (Signed)
 Received after hours page. Returned call to patient and confirmed DOB. Patient requests refill of MS Contin  30 mg TID PRN. Reviewed PDMP and last fill on 08/10/23. PDMP appropriate. [facility] [address] Confirmed with pharmacist at [facility] that they have the amount (90 tablets). Rx sent today with 90 tablets and 0 refills. Patient verbalizes understanding of plan and all questions addressed.

## 2023-10-08 ENCOUNTER — Telehealth: Payer: Self-pay | Admitting: Student

## 2023-10-08 DIAGNOSIS — M5442 Lumbago with sciatica, left side: Secondary | ICD-10-CM

## 2023-10-08 DIAGNOSIS — M5441 Lumbago with sciatica, right side: Secondary | ICD-10-CM

## 2023-10-08 MED ORDER — MORPHINE SULFATE ER 30 MG PO TBCR: 30.0000 mg | EXTENDED_RELEASE_TABLET | Freq: Three times a day (TID) | ORAL | 0 refills | Status: DC

## 2023-10-08 MED ORDER — GABAPENTIN 800 MG PO TABS: ORAL_TABLET | ORAL | 0 refills | Status: DC

## 2023-10-08 NOTE — Telephone Encounter (Signed)
 Medication refill request.

## 2023-11-03 ENCOUNTER — Other Ambulatory Visit: Payer: Self-pay | Admitting: Student

## 2023-11-03 DIAGNOSIS — M5442 Lumbago with sciatica, left side: Secondary | ICD-10-CM

## 2023-11-06 ENCOUNTER — Other Ambulatory Visit: Payer: Self-pay | Admitting: *Deleted

## 2023-11-06 DIAGNOSIS — G8929 Other chronic pain: Secondary | ICD-10-CM

## 2023-11-07 ENCOUNTER — Other Ambulatory Visit: Payer: Self-pay | Admitting: Student

## 2023-11-07 DIAGNOSIS — G8929 Other chronic pain: Secondary | ICD-10-CM

## 2023-11-07 MED ORDER — MORPHINE SULFATE ER 30 MG PO TBCR: 30.0000 mg | EXTENDED_RELEASE_TABLET | Freq: Three times a day (TID) | ORAL | 0 refills | Status: DC

## 2023-11-07 NOTE — Telephone Encounter (Signed)
 [facility]'s stated [facility] [address] does not have Morphine  in stock; wants rx sent to [facility] on [address]. Thanks

## 2023-11-07 NOTE — Telephone Encounter (Signed)
 No, narcotics can not be transfer; new rx is needed.

## 2023-11-07 NOTE — Progress Notes (Signed)
 [facility] on [address] did not have patient's morphine  prescription.  Called and canceled the morphine  prescription that was sent to [facility] on [address].  Sent new prescription to [facility] on [address].

## 2023-11-07 NOTE — Addendum Note (Signed)
 Addended by: CAMMIE GAETANA DEL on: 11/07/2023 09:36 AM   Modules accepted: Orders

## 2023-12-03 ENCOUNTER — Other Ambulatory Visit: Payer: Self-pay | Admitting: Student

## 2023-12-03 DIAGNOSIS — M5442 Lumbago with sciatica, left side: Secondary | ICD-10-CM

## 2023-12-05 ENCOUNTER — Other Ambulatory Visit: Payer: Self-pay | Admitting: Student

## 2023-12-05 DIAGNOSIS — R41840 Attention and concentration deficit: Secondary | ICD-10-CM

## 2023-12-05 DIAGNOSIS — F319 Bipolar disorder, unspecified: Secondary | ICD-10-CM

## 2023-12-05 DIAGNOSIS — M5442 Lumbago with sciatica, left side: Secondary | ICD-10-CM

## 2023-12-05 MED ORDER — MORPHINE SULFATE ER 30 MG PO TBCR: 30.0000 mg | EXTENDED_RELEASE_TABLET | Freq: Three times a day (TID) | ORAL | 0 refills | Status: DC

## 2023-12-05 NOTE — Telephone Encounter (Signed)
   Reason for call:  I received a call from Mr. Marty Uy Milford at 05:53 PM PM indicating refill on MS Contin .   Pertinent Data:  PDMP reviewed, patient last fill history was on 11/08/2023 for a 30-day supply.   Assessment / Plan / Recommendations:  Patient was notified that his next fill would be on 12/07/2023; [facility] [address]   [place], [place], DO   12/05/2023, 5:58 PM

## 2023-12-05 NOTE — Telephone Encounter (Unsigned)
 Copied from CRM #[id number]. Topic: Clinical - Medication Refill >> Dec 05, 2023  9:38 AM Cherylann S wrote: Medication: morphine  (MS CONTIN ) 30 MG 12 hr tablet  QUEtiapine  (SEROQUEL ) 300 MG tablet  Has the patient contacted their pharmacy? No (Agent: If no, request that the patient contact the pharmacy for the refill. If patient does not wish to contact the pharmacy document the reason why and proceed with request.) (Agent: If yes, when and what did the pharmacy advise?) Controlled Substqance  This is the patient's preferred pharmacy:  [facility] #[id number] [place], [place] - [address] AT [facility] OF [facility] & [address] [place] [address] Phone: [phone] Fax: [phone]  Is this the correct pharmacy for this prescription? Yes If no, delete pharmacy and type the correct one.   Has the prescription been filled recently? No  Is the patient out of the medication? Yes  Has the patient been seen for an appointment in the last year OR does the patient have an upcoming appointment? Yes  Can we respond through MyChart? Yes  Agent: Please be advised that Rx refills may take up to 3 business days. We ask that you follow-up with your pharmacy.

## 2024-01-03 ENCOUNTER — Other Ambulatory Visit: Payer: Self-pay | Admitting: Student

## 2024-01-03 DIAGNOSIS — G8929 Other chronic pain: Secondary | ICD-10-CM

## 2024-01-03 MED ORDER — MORPHINE SULFATE ER 30 MG PO TBCR: 30.0000 mg | EXTENDED_RELEASE_TABLET | Freq: Three times a day (TID) | ORAL | 0 refills | Status: DC

## 2024-01-03 MED ORDER — GABAPENTIN 800 MG PO TABS: ORAL_TABLET | ORAL | 0 refills | Status: DC

## 2024-01-03 NOTE — Telephone Encounter (Signed)
 Copied from CRM [id number]. Topic: Clinical - Medication Refill >> Jan 03, 2024  8:35 AM Cherylann RAMAN wrote: Medication: morphine  (MS CONTIN ) 30 MG 12 hr tablet  Has the patient contacted their pharmacy? No (Agent: If no, request that the patient contact the pharmacy for the refill. If patient does not wish to contact the pharmacy document the reason why and proceed with request.) (Agent: If yes, when and what did the pharmacy advise?) Controlled substance  This is the patient's preferred pharmacy:  [id number] - [place], [place] - [address] AT [facility] OF [address] & [address] [place] [address] Phone: [phone] Fax: [phone]  Is this the correct pharmacy for this prescription? Yes If no, delete pharmacy and type the correct one.   Has the prescription been filled recently? No  Is the patient out of the medication? Yes  Has the patient been seen for an appointment in the last year OR does the patient have an upcoming appointment? Yes  Can we respond through MyChart? Yes  Agent: Please be advised that Rx refills may take up to 3 business days. We ask that you follow-up with your pharmacy.

## 2024-01-03 NOTE — Telephone Encounter (Signed)
 I called the patient to schedule a appointment. I was not able to reach the patient, I lvm for him to give us  a call back.

## 2024-01-04 ENCOUNTER — Telehealth: Payer: Self-pay | Admitting: Student

## 2024-01-04 ENCOUNTER — Other Ambulatory Visit: Payer: Self-pay | Admitting: Student

## 2024-01-04 DIAGNOSIS — M5441 Lumbago with sciatica, right side: Secondary | ICD-10-CM

## 2024-01-04 MED ORDER — MORPHINE SULFATE ER 30 MG PO TBCR: 30.0000 mg | EXTENDED_RELEASE_TABLET | Freq: Three times a day (TID) | ORAL | 0 refills | Status: DC

## 2024-01-04 NOTE — Telephone Encounter (Signed)
   IMTS was paged regarding Roberto Blair, and he was called back promptly. The patient reported being unable to pick up his MS Contin  30 mg, as it had not been sent to his pharmacy.  Chart review revealed that the prescription was mistakenly sent to the wrong pharmacy on 01/03/2024. I confirmed the patient's preferred pharmacy and re-routed the prescription to: [id number] - [place], [place] - [address] [facility] of [address] & [address]).  PDMP and refill history were reviewed.  I apologized to Roberto Blair for the confusion, and he expressed appreciation for the follow-up call.Appreciate Dr Karna for following up on this.  Signature: Drue Grow , MD Internal Medicine Resident, PGY-2 [facility]  Pager: [phone] 6:54 PM, 01/04/2024   Please contact the on call pager after 5 pm and on weekends at [phone]

## 2024-01-14 ENCOUNTER — Ambulatory Visit: Payer: Self-pay

## 2024-01-21 ENCOUNTER — Ambulatory Visit: Payer: Self-pay | Admitting: Student

## 2024-01-21 ENCOUNTER — Encounter: Payer: Self-pay | Admitting: Student

## 2024-01-30 ENCOUNTER — Telehealth: Payer: Self-pay | Admitting: Student

## 2024-01-30 DIAGNOSIS — M5442 Lumbago with sciatica, left side: Secondary | ICD-10-CM

## 2024-01-30 NOTE — Telephone Encounter (Unsigned)
 Copied from CRM #[id number]. Topic: Clinical - Medication Refill >> Jan 30, 2024 11:38 AM Carrielelia G wrote: Medication: morphine  (MS CONTIN ) 30 MG 12 hr tablet  Has the patient contacted their pharmacy? Yes (Agent: If no, request that the patient contact the pharmacy for the refill. If patient does not wish to contact the pharmacy document the reason why and proceed with request.) (Agent: If yes, when and what did the pharmacy advise?)  This is the patient's preferred pharmacy:  [facility] #[id number] [place], [place] - [address] AT [facility] OF [facility] & [address] [place] [address] Phone: [phone] Fax: [phone]  Is this the correct pharmacy for this prescription? Yes If no, delete pharmacy and type the correct one.    Is the patient out of the medication? no  Has the patient been seen for an appointment in the last year OR does the patient have an upcoming appointment? Yes  Can we respond through MyChart? No  Agent: Please be advised that Rx refills may take up to 3 business days. We ask that you follow-up with your pharmacy.

## 2024-02-01 ENCOUNTER — Encounter: Payer: Self-pay | Admitting: Student

## 2024-02-01 ENCOUNTER — Ambulatory Visit: Payer: Self-pay | Admitting: Student

## 2024-02-01 VITALS — BP 140/81 | HR 70 | Temp 97.9°F | Ht 73.0 in | Wt 241.4 lb

## 2024-02-01 DIAGNOSIS — G8929 Other chronic pain: Secondary | ICD-10-CM | POA: Diagnosis not present

## 2024-02-01 DIAGNOSIS — Z79891 Long term (current) use of opiate analgesic: Secondary | ICD-10-CM

## 2024-02-01 DIAGNOSIS — Z720 Tobacco use: Secondary | ICD-10-CM

## 2024-02-01 DIAGNOSIS — Z13220 Encounter for screening for lipoid disorders: Secondary | ICD-10-CM

## 2024-02-01 DIAGNOSIS — Z131 Encounter for screening for diabetes mellitus: Secondary | ICD-10-CM

## 2024-02-01 DIAGNOSIS — I10 Essential (primary) hypertension: Secondary | ICD-10-CM

## 2024-02-01 DIAGNOSIS — F5104 Psychophysiologic insomnia: Secondary | ICD-10-CM | POA: Diagnosis not present

## 2024-02-01 DIAGNOSIS — F17210 Nicotine dependence, cigarettes, uncomplicated: Secondary | ICD-10-CM | POA: Diagnosis not present

## 2024-02-01 LAB — POCT GLYCOSYLATED HEMOGLOBIN (HGB A1C): Hemoglobin A1C: 5.8 % — AB (ref 4.0–5.6)

## 2024-02-01 LAB — GLUCOSE, CAPILLARY: Glucose-Capillary: 111 mg/dL — ABNORMAL HIGH (ref 70–99)

## 2024-02-01 MED ORDER — QUETIAPINE FUMARATE 300 MG PO TABS: ORAL_TABLET | ORAL | 3 refills | Status: AC

## 2024-02-01 MED ORDER — MORPHINE SULFATE ER 30 MG PO TBCR: 30.0000 mg | EXTENDED_RELEASE_TABLET | Freq: Three times a day (TID) | ORAL | 0 refills | Status: DC

## 2024-02-01 MED ORDER — AMLODIPINE BESYLATE 5 MG PO TABS: 5.0000 mg | ORAL_TABLET | Freq: Every day | ORAL | 3 refills | Status: AC

## 2024-02-01 MED ORDER — GABAPENTIN 800 MG PO TABS: ORAL_TABLET | ORAL | 0 refills | Status: DC

## 2024-02-01 NOTE — Assessment & Plan Note (Deleted)
 SABRA

## 2024-02-01 NOTE — Assessment & Plan Note (Addendum)
 PDMP appropriate. Update tox-assure. Sent 3 prescriptions for MS Contin  with staggered start dates for 90-day total supply. Orders:   ToxAssure Select,+Antidepr,UR

## 2024-02-01 NOTE — Assessment & Plan Note (Addendum)
 Prior quit attempts: yes, prior to TMJ surgery Triggers: enjoyment Planned quit date: deferred until follow-up Pharmacotherapy: deferred until follow-up 5 minutes spent on counseling.

## 2024-02-01 NOTE — Assessment & Plan Note (Deleted)
  Orders:   amLODipine  (NORVASC ) 5 MG tablet; Take 1 tablet (5 mg total) by mouth daily.

## 2024-02-01 NOTE — Assessment & Plan Note (Addendum)
 Chronic and stable on Seroquel  and clonidine . Refilled Seroquel  today. Orders:   QUEtiapine  (SEROQUEL ) 300 MG tablet; TAKE 1 TABLET BY MOUTH AT BEDTIME. Do not drive heavy machinery including cars after taking medication.
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
## 2024-02-01 NOTE — Assessment & Plan Note (Addendum)
 Chronic and stable. Refilled morphine  and gabapentin  today. Orders:   gabapentin  (NEURONTIN ) 800 MG tablet; TAKE 1 AND 1/2 TABLETS BY MOUTH EVERY MORNING, 1 TABLET AT NOON, AND 1 AND 1/2 TABLETS EVERY NIGHT AT BEDTIME   morphine  (MS CONTIN ) 30 MG 12 hr tablet; Take 1 tablet (30 mg total) by mouth 3 (three) times daily.   morphine  (MS CONTIN ) 30 MG 12 hr tablet; Take 1 tablet (30 mg total) by mouth 3 (three) times daily.   morphine  (MS CONTIN ) 30 MG 12 hr tablet; Take 1 tablet (30 mg total) by mouth 3 (three) times daily.

## 2024-02-01 NOTE — Assessment & Plan Note (Addendum)
 BP Readings from Last 3 Encounters:  02/01/24 (!) 140/81  05/11/23 (!) 142/69  03/22/23 (!) 164/89   Chronic with poor control, non-adherence to medication. Restart amlodipine  5 mg daily. Check blood pressure weekly at home, maintain log for next follow-up visit.  Orders:   amLODipine  (NORVASC ) 5 MG tablet; Take 1 tablet (5 mg total) by mouth daily.   Basic metabolic panel with GFR

## 2024-02-01 NOTE — Patient Instructions (Signed)
 VISIT SUMMARY: Today, you came in for medication refills and discussed several ongoing health issues, including chronic low back pain, left heel pain, high blood pressure, high cholesterol, and sleep disturbances. We also talked about your tobacco use and plans for quitting.  YOUR PLAN: CHRONIC LOW BACK PAIN WITH LEFT LEG SCIATICA: You have chronic low back pain that radiates down your left leg, likely due to past trauma and degenerative changes. -Refilled your MS Contin  prescription at [place], [place]. -Refilled your gabapentin  prescription at [facility] [address]. -Provided three refills of MS Contin  to reduce the frequency of clinic visits.  CHRONIC LEFT HEEL PLANTAR FASCIITIS: You have pain in your left heel that worsens with walking, especially in the morning. -Continue with stretching exercises and using cushioned insoles.  ESSENTIAL HYPERTENSION: Your blood pressure is elevated, likely because you haven't been taking your medication regularly. -Refilled your amlodipine  prescription. -Check your blood pressure at home regularly and keep a diary of your readings. -We checked your cholesterol, kidney function, electrolytes, and blood sugar today. -Adjust your blood pressure medication based on home readings and follow-up visits.  TOBACCO USE DISORDER: You smoke one to one and a half packs of cigarettes daily and want to quit. -We will discuss a smoking cessation plan at your next visit. -Consider pharmacotherapy for smoking cessation at your next visit.  INSOMNIA: You have trouble sleeping and are currently using Seroquel  and clonidine . -Refilled your Seroquel  prescription at [facility] on [address].  GENERAL HEALTH MAINTENANCE: Routine health maintenance was discussed, focusing on managing your blood pressure and cholesterol. -We checked your cholesterol, kidney function, electrolytes, and blood sugar today. -Encouraged regular blood pressure monitoring at home.  Remember to bring  all of the medications that you take (including over the counter medications and supplements) with you to every clinic visit.  This after visit summary is an important review of tests, referrals, and medication changes that were discussed during your visit. If you have questions or concerns, call [phone]. Outside of clinic business hours, call the main hospital at [phone] and ask the operator for the on-call internal medicine resident.   Ozell Kung MD 02/01/2024, 8:48 AM

## 2024-02-02 ENCOUNTER — Other Ambulatory Visit: Payer: Self-pay | Admitting: Student

## 2024-02-02 DIAGNOSIS — G8929 Other chronic pain: Secondary | ICD-10-CM

## 2024-02-02 LAB — BASIC METABOLIC PANEL WITH GFR
BUN/Creatinine Ratio: 14 (ref 10–24)
BUN: 12 mg/dL (ref 8–27)
CO2: 23 mmol/L (ref 20–29)
Calcium: 8.9 mg/dL (ref 8.6–10.2)
Chloride: 102 mmol/L (ref 96–106)
Creatinine, Ser: 0.84 mg/dL (ref 0.76–1.27)
Glucose: 96 mg/dL (ref 70–99)
Potassium: 4.9 mmol/L (ref 3.5–5.2)
Sodium: 138 mmol/L (ref 134–144)
eGFR: 100 mL/min/1.73 (ref >59)

## 2024-02-02 LAB — LIPID PANEL
Chol/HDL Ratio: 5.9 ratio — ABNORMAL HIGH (ref 0.0–5.0)
Cholesterol, Total: 171 mg/dL (ref 100–199)
HDL: 29 mg/dL — ABNORMAL LOW (ref >39)
LDL Chol Calc (NIH): 118 mg/dL — ABNORMAL HIGH (ref 0–99)
Triglycerides: 131 mg/dL (ref 0–149)
VLDL Cholesterol Cal: 24 mg/dL (ref 5–40)

## 2024-02-02 MED ORDER — MORPHINE SULFATE ER 30 MG PO TBCR: 30.0000 mg | EXTENDED_RELEASE_TABLET | Freq: Three times a day (TID) | ORAL | 0 refills | Status: AC

## 2024-02-02 NOTE — Progress Notes (Signed)
 Yesterday refills for Roberto Blair's morphine  were sent to a local [facility] but they did not have supply to fill it. Today, Roberto Blair is requesting his rx be sent to [facility] at [address] with Gailen Brought which he called and learned they have a supply. PDMP and office notes reviewed and appropriate. I will send his morphine  there. Three separate rx for morphine  30mg  taken TID, 90 pills for 30 day supply, to be filled in 30 day intervals.

## 2024-02-05 ENCOUNTER — Telehealth: Payer: Self-pay

## 2024-02-05 NOTE — Telephone Encounter (Signed)
 Roberto Blair ([name]) PA Case ID #: [id number] Need Help? Call us  at [phone] Outcome Approved today by [facility] [employer] [facility] 2017 Approved. Effective Date: 02/05/2024 Authorization Expiration Date: 02/04/2025 Drug Morphine  Sulfate ER 30MG  er tablets ePA cloud logo Form [employer] Electronic Request Form

## 2024-02-05 NOTE — Telephone Encounter (Signed)
 Prior Authorization for patient (  Morphine  Sulfate ER 30MG  er tablets  ) came through on cover my meds was submitted with last office notes awaiting approval or denial.  KEY: [id number]

## 2024-02-06 LAB — TOXASSURE SELECT,+ANTIDEPR,UR

## 2024-02-08 NOTE — Progress Notes (Signed)
 Internal Medicine Clinic Attending  Case discussed with the resident at the time of the visit.  We reviewed the resident's history and exam and pertinent patient test results.  I agree with the assessment, diagnosis, and plan of care documented in the resident's note.

## 2024-03-08 ENCOUNTER — Other Ambulatory Visit: Payer: Self-pay | Admitting: Student

## 2024-03-08 DIAGNOSIS — G8929 Other chronic pain: Secondary | ICD-10-CM

## 2024-03-08 MED ORDER — GABAPENTIN 800 MG PO TABS: ORAL_TABLET | ORAL | 3 refills | Status: AC

## 2024-03-08 NOTE — Progress Notes (Signed)
 After hours paged for refill on Gabapentin .  Refill sent to pharmacy with additional refills.   Damien Lease, DO Internal Medicine Resident: [id number] Please contact the on call pager at: [phone]
